# Patient Record
Sex: Female | Born: 1975 | Race: White | Hispanic: No | Marital: Married | Smoking: Never smoker
Health system: Southern US, Community
[De-identification: ages and names within clinical notes are randomized; demographics above are authoritative.]

## PROBLEM LIST (undated history)

## (undated) ENCOUNTER — Inpatient Hospital Stay (HOSPITAL_COMMUNITY): Admission: AD | Payer: Managed Care, Other (non HMO) | Source: Ambulatory Visit | Admitting: Obstetrics and Gynecology

## (undated) DIAGNOSIS — T7840XA Allergy, unspecified, initial encounter: Secondary | ICD-10-CM

## (undated) DIAGNOSIS — E785 Hyperlipidemia, unspecified: Secondary | ICD-10-CM

## (undated) DIAGNOSIS — C50919 Malignant neoplasm of unspecified site of unspecified female breast: Secondary | ICD-10-CM

## (undated) DIAGNOSIS — G43909 Migraine, unspecified, not intractable, without status migrainosus: Secondary | ICD-10-CM

## (undated) DIAGNOSIS — L0390 Cellulitis, unspecified: Secondary | ICD-10-CM

## (undated) DIAGNOSIS — I890 Lymphedema, not elsewhere classified: Secondary | ICD-10-CM

## (undated) DIAGNOSIS — E739 Lactose intolerance, unspecified: Secondary | ICD-10-CM

## (undated) DIAGNOSIS — M549 Dorsalgia, unspecified: Secondary | ICD-10-CM

## (undated) DIAGNOSIS — D509 Iron deficiency anemia, unspecified: Secondary | ICD-10-CM

## (undated) DIAGNOSIS — F419 Anxiety disorder, unspecified: Secondary | ICD-10-CM

## (undated) HISTORY — PX: KNEE SURGERY: SHX244

## (undated) HISTORY — PX: APPENDECTOMY: SHX54

## (undated) HISTORY — DX: Anxiety disorder, unspecified: F41.9

## (undated) HISTORY — PX: SHOULDER SURGERY: SHX246

## (undated) HISTORY — PX: ABDOMINAL HYSTERECTOMY: SHX81

## (undated) HISTORY — DX: Dorsalgia, unspecified: M54.9

## (undated) HISTORY — DX: Malignant neoplasm of unspecified site of unspecified female breast: C50.919

## (undated) HISTORY — DX: Migraine, unspecified, not intractable, without status migrainosus: G43.909

## (undated) HISTORY — DX: Lymphedema, not elsewhere classified: I89.0

## (undated) HISTORY — DX: Lactose intolerance, unspecified: E73.9

## (undated) HISTORY — DX: Allergy, unspecified, initial encounter: T78.40XA

## (undated) HISTORY — DX: Cellulitis, unspecified: L03.90

---

## 2003-04-03 ENCOUNTER — Encounter: Admission: RE | Admit: 2003-04-03 | Discharge: 2003-04-03 | Payer: Self-pay | Admitting: Gastroenterology

## 2003-04-03 ENCOUNTER — Encounter: Payer: Self-pay | Admitting: Gastroenterology

## 2003-08-06 ENCOUNTER — Other Ambulatory Visit: Admission: RE | Admit: 2003-08-06 | Discharge: 2003-08-06 | Payer: Self-pay | Admitting: Obstetrics and Gynecology

## 2006-05-07 ENCOUNTER — Encounter (INDEPENDENT_AMBULATORY_CARE_PROVIDER_SITE_OTHER): Payer: Self-pay | Admitting: *Deleted

## 2006-05-07 ENCOUNTER — Inpatient Hospital Stay (HOSPITAL_COMMUNITY): Admission: AD | Admit: 2006-05-07 | Discharge: 2006-05-09 | Payer: Self-pay | Admitting: Obstetrics and Gynecology

## 2006-06-27 ENCOUNTER — Ambulatory Visit: Payer: Self-pay | Admitting: Family Medicine

## 2006-12-15 DIAGNOSIS — J301 Allergic rhinitis due to pollen: Secondary | ICD-10-CM

## 2007-05-23 ENCOUNTER — Telehealth (INDEPENDENT_AMBULATORY_CARE_PROVIDER_SITE_OTHER): Payer: Self-pay | Admitting: *Deleted

## 2007-05-24 ENCOUNTER — Ambulatory Visit: Payer: Self-pay | Admitting: Family Medicine

## 2007-06-01 ENCOUNTER — Telehealth (INDEPENDENT_AMBULATORY_CARE_PROVIDER_SITE_OTHER): Payer: Self-pay | Admitting: *Deleted

## 2007-06-01 LAB — CONVERTED CEMR LAB: Lead-Whole Blood: 0.9 ug/dL (ref <10.0)

## 2008-01-25 ENCOUNTER — Encounter: Payer: Self-pay | Admitting: Family Medicine

## 2008-01-25 ENCOUNTER — Telehealth (INDEPENDENT_AMBULATORY_CARE_PROVIDER_SITE_OTHER): Payer: Self-pay | Admitting: *Deleted

## 2008-01-26 ENCOUNTER — Ambulatory Visit (HOSPITAL_COMMUNITY): Admission: RE | Admit: 2008-01-26 | Discharge: 2008-01-26 | Payer: Self-pay | Admitting: Gastroenterology

## 2008-06-15 ENCOUNTER — Encounter: Payer: Self-pay | Admitting: Family Medicine

## 2010-07-23 ENCOUNTER — Inpatient Hospital Stay (HOSPITAL_COMMUNITY)
Admission: AD | Admit: 2010-07-23 | Discharge: 2010-07-23 | Payer: Self-pay | Source: Home / Self Care | Admitting: Obstetrics & Gynecology

## 2010-08-23 NOTE — L&D Delivery Note (Signed)
Complete dilation at 0800 vtx +3. Urge to push - water birth. FHT 150.  SVB viable female - no difficulty in shoulders at 0814. No nuchal cord. Newborn brought to surface immediately at birth -to mother. Cord double clamped and cut by FOB after pulsation cessation.  Mother assisted out of tub.  Cord blood sample and cord blood donation obtained.  Placenta spontaneous and intact with 3VC at 0830. Fundus firm after massage and small bleeding. Pitocin 10 units IM.  1 degree laceration - 1% xylocaine local. Repair subcuticular with 4-0 vicryl.  EBL: 500 ml

## 2010-08-27 ENCOUNTER — Inpatient Hospital Stay (HOSPITAL_COMMUNITY)
Admission: AD | Admit: 2010-08-27 | Discharge: 2010-08-30 | Payer: Self-pay | Source: Home / Self Care | Attending: Obstetrics and Gynecology | Admitting: Obstetrics and Gynecology

## 2010-08-27 LAB — COMPREHENSIVE METABOLIC PANEL
ALT: 15 U/L (ref 0–35)
AST: 20 U/L (ref 0–37)
Albumin: 3.4 g/dL — ABNORMAL LOW (ref 3.5–5.2)
Alkaline Phosphatase: 66 U/L (ref 39–117)
BUN: 6 mg/dL (ref 6–23)
CO2: 22 mEq/L (ref 19–32)
Calcium: 9 mg/dL (ref 8.4–10.5)
Chloride: 103 mEq/L (ref 96–112)
Creatinine, Ser: 0.73 mg/dL (ref 0.4–1.2)
GFR calc Af Amer: >60 mL/min (ref >60)
GFR calc non Af Amer: >60 mL/min (ref >60)
Glucose, Bld: 82 mg/dL (ref 70–99)
Potassium: 3.8 mEq/L (ref 3.5–5.1)
Sodium: 134 mEq/L — ABNORMAL LOW (ref 135–145)
Total Bilirubin: 0.3 mg/dL (ref 0.3–1.2)
Total Protein: 6.7 g/dL (ref 6.0–8.3)

## 2010-08-27 LAB — URINE MICROSCOPIC-ADD ON

## 2010-08-27 LAB — CBC
HCT: 36.7 % (ref 36.0–46.0)
Hemoglobin: 12.6 g/dL (ref 12.0–15.0)
MCH: 30 pg (ref 26.0–34.0)
MCHC: 34.3 g/dL (ref 30.0–36.0)
MCV: 87.4 fL (ref 78.0–100.0)
Platelets: 255 10*3/uL (ref 150–400)
RBC: 4.2 MIL/uL (ref 3.87–5.11)
RDW: 12 % (ref 11.5–15.5)
WBC: 8.7 10*3/uL (ref 4.0–10.5)

## 2010-08-27 LAB — URINALYSIS, ROUTINE W REFLEX MICROSCOPIC
Hemoglobin, Urine: NEGATIVE
Ketones, ur: NEGATIVE mg/dL
Nitrite: NEGATIVE
Protein, ur: NEGATIVE mg/dL
Specific Gravity, Urine: 1.025 (ref 1.005–1.030)
Urine Glucose, Fasting: NEGATIVE mg/dL
Urobilinogen, UA: 0.2 mg/dL (ref 0.0–1.0)
pH: 7 (ref 5.0–8.0)

## 2010-08-27 LAB — GLUCOSE, CAPILLARY
Glucose-Capillary: 111 mg/dL — ABNORMAL HIGH (ref 70–99)
Glucose-Capillary: 111 mg/dL — ABNORMAL HIGH (ref 70–99)

## 2010-08-28 LAB — GLUCOSE, CAPILLARY
Glucose-Capillary: 95 mg/dL (ref 70–99)
Glucose-Capillary: 77 mg/dL (ref 70–99)

## 2010-09-07 LAB — GLUCOSE, CAPILLARY
Glucose-Capillary: 94 mg/dL (ref 70–99)
Glucose-Capillary: 89 mg/dL (ref 70–99)
Glucose-Capillary: 111 mg/dL — ABNORMAL HIGH (ref 70–99)
Glucose-Capillary: 121 mg/dL — ABNORMAL HIGH (ref 70–99)
Glucose-Capillary: 88 mg/dL (ref 70–99)

## 2010-10-16 NOTE — Discharge Summary (Signed)
Jody Dunn, Jody Dunn               ACCOUNT NO.:  [id number]  MEDICAL RECORD NO.:  [id number]          PATIENT TYPE:  INP  LOCATION:  9315                          FACILITY:  [facility]  PHYSICIAN:  Lenoard Aden, M.D.DATE OF BIRTH:  1976/06/27  DATE OF ADMISSION:  08/27/2010 DATE OF DISCHARGE:  08/30/2010                              DISCHARGE SUMMARY   ADMITTING DIAGNOSES: 1. 13 weeks' gestation. 2. Hyperemesis. 3. Dehydration. 4. Positive Helicobacter pylori. 5. Gastroesophageal reflux disease.  DISCHARGE DIAGNOSES: 1. 13 weeks' gestation. 2. Hyperemesis, stable condition. 3. Positive Helicobacter pylori, under treatment.  HISTORY OF PRESENT ILLNESS:  The patient is a 35 year old, gravida 2, para 1 at 47 weeks' gestation with an [facility] of February 24, 2011.  The patient has received prenatal care at [facility] with [name], certified nurse midwife as primary since 6 weeks' gestation.  Pregnancy is complicated by hyperemesis, dehydration, and positive H. Pylori.  MEDICATIONS AT TIME OF ADMISSION: 1. Probiotic. 2. Phenergan 25 mg p.o. daily. 3. Zantac 150 mg p.o. daily. 4. Clarithromycin b.i.d. 5. Metronidazole 500 b.i.d.  Both antibiotics intolerable by p.o.     intake. 6. Zofran 8 mg p.o. p.r.n. for vomiting. 7. Reglan 10 mg p.o. q.6 h.  The patient has a known allergy to PENICILLIN, DURAPREP, and IODINE.  MEDICAL HISTORY:  Complicated by an allergic rhinitis, GERD, lactose intolerance, mild reactive airway with asthma exercise induced, altered fasting glucose, and PCOS with infertility.  SURGERY HISTORY:  Appendectomy, shoulder surgery x1, knee surgery with a torn ACL x2.  OBSTETRIC HISTORY:  History of a 41-week delivery, 9 pounds and 7 ounces with a mild shoulder dystocia.  PRESENTATION:  The patient presents to the hospital for IV hydration and IV antibiotics due to failed home health, IV infiltration x5, and unable to tolerate oral antibiotics for  treatment for H. pylori.  The patient is admitted with IV hydration.  On admission, CBC, white blood cell count is 8.7, hemoglobin is 12.6, hematocrit is 36.7, and platelet count 255.  Potassium of 3.8 and creatinine of 0.073.  Urine specific gravity is 1.025 and no ketones on the day of admission.  The patient is initiated on IV Flagyl and oral erythromycin in addition to Nexium.  On hospital day #1, the patient is tolerating medication with only occasional nausea and vomiting.  The patient is still positive 2300 on I and O.  Physical exam is within normal limits.  Fetal heart tones 140s. On hospital day #2, fasting blood sugar was 111.  The patient is negative 700 on net  I and O.  The patient is tolerating more oral intake, tolerating antibiotics orally, only mild nausea and 1 episode of vomiting.  On hospital day #3, the patient was tolerating medications, no nausea, no vomiting, negative net output with rehydration.  The patient is discharged home in stable condition.  MEDICATIONS AT TIME OF DISCHARGE: 1. Prenatal vitamin 1 tablet p.o. daily. 2. Probiotics p.o. daily. 3. Nexium 40 mg p.o. daily. 4. Clarithromycin 1 tablet p.o. daily x2 weeks. 5. Meclizine 25 mg p.o. b.i.d. 6. Zofran 4 mg p.o. p.r.n. only for vomiting.  The patient is to follow up at [facility] in 4 days for routine OB appointment.     [name], C.N.M.   ______________________________ Lenoard Aden, M.D.    TB/MEDQ  D:  09/26/2010  T:  09/27/2010  Job:  [id number]  Electronically Signed by [name] C.N.M. on 10/08/2010 11:18:54 AM Electronically Signed by Olivia Mackie M.D. on 10/16/2010 11:06:11 AM

## 2010-11-02 LAB — URINALYSIS, ROUTINE W REFLEX MICROSCOPIC
Bilirubin Urine: NEGATIVE
Glucose, UA: NEGATIVE mg/dL
Hgb urine dipstick: NEGATIVE
Ketones, ur: 15 mg/dL — AB
Nitrite: NEGATIVE
Protein, ur: NEGATIVE mg/dL
Specific Gravity, Urine: >1.03 — ABNORMAL HIGH (ref 1.005–1.030)
Urobilinogen, UA: 1 mg/dL (ref 0.0–1.0)
pH: 6 (ref 5.0–8.0)

## 2011-03-01 ENCOUNTER — Inpatient Hospital Stay (HOSPITAL_COMMUNITY)
Admission: AD | Admit: 2011-03-01 | Discharge: 2011-03-02 | DRG: 775 | Disposition: A | Discharge status: Home / Self Care | Payer: Managed Care, Other (non HMO) | Source: Ambulatory Visit | Attending: Obstetrics and Gynecology | Admitting: Obstetrics and Gynecology

## 2011-03-01 ENCOUNTER — Encounter (HOSPITAL_COMMUNITY): Payer: Self-pay | Admitting: *Deleted

## 2011-03-01 DIAGNOSIS — D509 Iron deficiency anemia, unspecified: Secondary | ICD-10-CM | POA: Insufficient documentation

## 2011-03-01 DIAGNOSIS — O9902 Anemia complicating childbirth: Secondary | ICD-10-CM | POA: Diagnosis present

## 2011-03-01 HISTORY — DX: Iron deficiency anemia, unspecified: D50.9

## 2011-03-01 LAB — CBC
HCT: 31.3 % — ABNORMAL LOW (ref 36.0–46.0)
HCT: 30.4 % — ABNORMAL LOW (ref 36.0–46.0)
Hemoglobin: 10.1 g/dL — ABNORMAL LOW (ref 12.0–15.0)
Hemoglobin: 9.8 g/dL — ABNORMAL LOW (ref 12.0–15.0)
MCH: 27.7 pg (ref 26.0–34.0)
MCH: 27.5 pg (ref 26.0–34.0)
MCHC: 32.3 g/dL (ref 30.0–36.0)
MCHC: 32.2 g/dL (ref 30.0–36.0)
MCV: 85.8 fL (ref 78.0–100.0)
MCV: 85.2 fL (ref 78.0–100.0)
Platelets: 278 10*3/uL (ref 150–400)
Platelets: 235 10*3/uL (ref 150–400)
RBC: 3.65 MIL/uL — ABNORMAL LOW (ref 3.87–5.11)
RBC: 3.57 MIL/uL — ABNORMAL LOW (ref 3.87–5.11)
RDW: 13.9 % (ref 11.5–15.5)
RDW: 13.9 % (ref 11.5–15.5)
WBC: 16.6 10*3/uL — ABNORMAL HIGH (ref 4.0–10.5)
WBC: 16.4 10*3/uL — ABNORMAL HIGH (ref 4.0–10.5)

## 2011-03-01 LAB — RAPID HIV SCREEN (WH-MAU): Rapid HIV Screen: NONREACTIVE

## 2011-03-01 LAB — RPR: RPR Ser Ql: NONREACTIVE

## 2011-03-01 MED ORDER — RHO D IMMUNE GLOBULIN 1500 UNIT/2ML IJ SOLN: 300.0000 ug | Freq: Once | INTRAMUSCULAR | Status: DC

## 2011-03-01 MED ORDER — OXYTOCIN 10 UNIT/ML IJ SOLN
INTRAMUSCULAR | Status: AC
  Administered 2011-03-01: 10 [IU] via INTRAMUSCULAR
  Filled 2011-03-01: qty 2

## 2011-03-01 MED ORDER — TETANUS-DIPHTH-ACELL PERTUSSIS 5-2.5-18.5 LF-MCG/0.5 IM SUSP
0.5000 mL | Freq: Once | INTRAMUSCULAR | Status: AC
  Administered 2011-03-02: 0.5 mL via INTRAMUSCULAR
  Filled 2011-03-01: qty 0.5

## 2011-03-01 MED ORDER — FLEET ENEMA 7-19 GM/118ML RE ENEM: 1.0000 | ENEMA | RECTAL | Status: DC | PRN

## 2011-03-01 MED ORDER — LACTATED RINGERS IV SOLN: 500.0000 mL | Freq: Once | INTRAVENOUS | Status: DC | PRN

## 2011-03-01 MED ORDER — IBUPROFEN 600 MG PO TABS: 600.0000 mg | ORAL_TABLET | Freq: Four times a day (QID) | ORAL | Status: DC | PRN

## 2011-03-01 MED ORDER — BENZOCAINE-MENTHOL 20-0.5 % EX AERO
1.0000 "application " | INHALATION_SPRAY | CUTANEOUS | Status: DC | PRN
  Administered 2011-03-01: 17:00:00 via TOPICAL

## 2011-03-01 MED ORDER — LIDOCAINE HCL (PF) 1 % IJ SOLN
30.0000 mL | Freq: Once | INTRAMUSCULAR | Status: AC | PRN
  Administered 2011-03-01: 30 mL via SUBCUTANEOUS
  Filled 2011-03-01: qty 30

## 2011-03-01 MED ORDER — LACTATED RINGERS IV SOLN: INTRAVENOUS | Status: DC

## 2011-03-01 MED ORDER — ONDANSETRON HCL 4 MG/2ML IJ SOLN: 4.0000 mg | Freq: Four times a day (QID) | INTRAMUSCULAR | Status: DC | PRN

## 2011-03-01 MED ORDER — SENNOSIDES-DOCUSATE SODIUM 8.6-50 MG PO TABS
1.0000 | ORAL_TABLET | Freq: Every day | ORAL | Status: DC
  Administered 2011-03-01: 1 via ORAL
  Filled 2011-03-01 (×2): qty 2

## 2011-03-01 MED ORDER — IBUPROFEN 600 MG PO TABS
600.0000 mg | ORAL_TABLET | Freq: Four times a day (QID) | ORAL | Status: DC
  Administered 2011-03-01 – 2011-03-02 (×2): 600 mg via ORAL
  Filled 2011-03-01 (×2): qty 1

## 2011-03-01 MED ORDER — OXYCODONE-ACETAMINOPHEN 5-325 MG PO TABS: 1.0000 | ORAL_TABLET | ORAL | Status: DC | PRN

## 2011-03-01 MED ORDER — ONDANSETRON HCL 4 MG PO TABS: 4.0000 mg | ORAL_TABLET | ORAL | Status: DC | PRN

## 2011-03-01 MED ORDER — DIPHENHYDRAMINE HCL 25 MG PO CAPS: 25.0000 mg | ORAL_CAPSULE | Freq: Four times a day (QID) | ORAL | Status: DC | PRN

## 2011-03-01 MED ORDER — PANTOPRAZOLE SODIUM 40 MG PO TBEC
40.0000 mg | DELAYED_RELEASE_TABLET | Freq: Every day | ORAL | Status: DC
  Administered 2011-03-01 – 2011-03-02 (×2): 40 mg via ORAL
  Filled 2011-03-01 (×3): qty 1

## 2011-03-01 MED ORDER — CITRIC ACID-SODIUM CITRATE 334-500 MG/5ML PO SOLN: 30.0000 mL | ORAL | Status: DC | PRN

## 2011-03-01 MED ORDER — LANOLIN HYDROUS EX OINT: TOPICAL_OINTMENT | CUTANEOUS | Status: DC | PRN

## 2011-03-01 MED ORDER — OXYTOCIN 20 UNITS IN LACTATED RINGERS INFUSION - SIMPLE: 125.0000 mL/h | Freq: Once | INTRAVENOUS | Status: DC

## 2011-03-01 MED ORDER — SIMETHICONE 80 MG PO CHEW: 80.0000 mg | CHEWABLE_TABLET | ORAL | Status: DC | PRN

## 2011-03-01 MED ORDER — WITCH HAZEL-GLYCERIN EX PADS: MEDICATED_PAD | CUTANEOUS | Status: DC | PRN

## 2011-03-01 MED ORDER — ACETAMINOPHEN 325 MG PO TABS: 650.0000 mg | ORAL_TABLET | ORAL | Status: DC | PRN

## 2011-03-01 MED ORDER — PRENATAL PLUS 27-1 MG PO TABS
1.0000 | ORAL_TABLET | Freq: Every day | ORAL | Status: DC
  Filled 2011-03-01: qty 1

## 2011-03-01 MED ORDER — BENZOCAINE-MENTHOL 20-0.5 % EX AERO
INHALATION_SPRAY | CUTANEOUS | Status: AC
  Filled 2011-03-01: qty 56

## 2011-03-01 NOTE — H&P (Signed)
Jody Dunn is a 35 y.o. female presenting for onset of labor. Maternal Medical History:  Reason for admission: Reason for admission: contractions.  Contractions: Onset was 3-5 hours ago.   Frequency: regular.   Duration is approximately 3 minutes.   Perceived severity is moderate.    Fetal activity: Perceived fetal activity is normal.   Last perceived fetal movement was within the past hour.    Prenatal complications: no prenatal complications Prenatal Complications - Diabetes: none.    OB History    Grav Para Term Preterm Abortions TAB SAB Ect Mult Living   2 1 1  0 0 0 0 0 0 1     Past Medical History  Diagnosis Date  . No pertinent past medical history    Past Surgical History  Procedure Date  . Appendectomy   . Shoulder surgery   . Knee surgery     X2   Family History: family history is not on file. Social History:  reports that she has never smoked. She does not have any smokeless tobacco history on file. She reports that she does not drink alcohol or use illicit drugs.  ROS Contractions since 0300 - no LOF or bleeding. Desires water birth. Hx moderate shoulder dystocia without maternal or fetal trauma with last delivery -birth weight 9-7 female at 41+ weeks. Current pregnancy EFW 8-13 at 54 weeks - female. Counseled regarding repeat risk of shoulder dystocia - pt declined elective cs or IOL / desires SVD - will proceed with cautious expectant management / MD updated (Jody Dunn) at time of admit.  Dilation: 3 Effacement (%): 80 Station: 0 Exam by:: Jody Cedar, RN Blood pressure 133/80, pulse 88, temperature 97.2 F (36.2 C), temperature source Axillary, resp. rate 19, height 5\' 8"  (1.727 m), weight 86.183 kg (190 lb). Exam Physical Exam  Alert and oriented x 3 - moderate discomfort with pain.  Heart RRR Lungs - clear Abdomen - gravid / nontender External genitalia - normal / no lesions  Extremities - trace edema / no varicosities  Prenatal labs: ABO, Rh:  O  +  Antibody:  Negative Rubella:  Rubella RPR:   NR HBsAg:   Negative HIV:   NR GBS:   negative  Assessment/Plan: 40 weeks in active labor Iron Deficiency anemia H-Pylori / GERD Hx shoulder dystocia  1) Admit - cautious expectant management  2) AROM with advanced dilation 3) attempt cautious SVD Jody Dunn 03/01/2011, 6:48 AM

## 2011-03-01 NOTE — Progress Notes (Signed)
Pt to room 161 ambulatory.

## 2011-03-01 NOTE — Progress Notes (Signed)
Increase with contractions - urge to push Laboring in tub No nausea - + shakes  VS stable - temp 97.7  Tub temp 98 degrees FHR doppler 150 Ctx every 3-4 minutes Cervix: 9 cm / 95% / vtx / BBOW AROM - clear - vtx + 2 ROA  A/P: Active labor           Prepare for SVD

## 2011-03-01 NOTE — Progress Notes (Signed)
BREASTFEEDING CONSULTATION SERVICES INFORMATION GIVEN TO PATIENT.  PATIENT BREASTFED FIRST BABY FOR SEVERAL MONTHS WITHOUT PROBLEMS.  NEWBORN IS NURSING VERY WELL PER MOM.  ENCOURAGED HER TO CALL FOR CONCERNS OR ASSIST PRN.

## 2011-03-01 NOTE — ED Notes (Signed)
Report called to birthing suites rn charge.  Pt may go to room 161.

## 2011-03-02 LAB — CBC
HCT: 29.6 % — ABNORMAL LOW (ref 36.0–46.0)
Hemoglobin: 9.3 g/dL — ABNORMAL LOW (ref 12.0–15.0)
MCH: 27 pg (ref 26.0–34.0)
MCHC: 31.4 g/dL (ref 30.0–36.0)
MCV: 86 fL (ref 78.0–100.0)
Platelets: 234 10*3/uL (ref 150–400)
RBC: 3.44 MIL/uL — ABNORMAL LOW (ref 3.87–5.11)
RDW: 14.2 % (ref 11.5–15.5)
WBC: 13.7 10*3/uL — ABNORMAL HIGH (ref 4.0–10.5)

## 2011-03-02 NOTE — Progress Notes (Signed)
Per mom infant recently ate , 15 minutes . Reviewed engorgement tx.

## 2011-03-02 NOTE — Discharge Summary (Signed)
  Patient ID: SHRUTI ARREY MRN: [id number] DOB/AGE: 03-11-1976 35 y.o.  Admit date: 03/01/2011 Discharge date: 03/02/2011  Admission Diagnoses: 40 weeks active labor  Discharge Diagnoses:  Active Problems:  Postpartum care and examination of lactating mother   Discharged Condition: stable  Hospital Course: admit in active labor - water birth plan / FHR cat 1 EFM then doppler in active labor - second stage                                AROM - clear - precipitous labor and birth - resulting SVD water birth  Consults: none  Significant Diagnostic Studies: none  Treatments: none  Discharge Exam: Blood pressure 138/87, pulse 73, temperature 97.6 F (36.4 C), temperature source Oral, resp. rate 20, height 5\' 8"  (1.727 m), weight 86.183 kg (190 lb). see progress note  Disposition:   Discharge Orders    Future Orders Please Complete By Expires   Diet - low sodium heart healthy      Discharge instructions      Comments:   [facility] discharge booklet     Current Discharge Medication List    CONTINUE these medications which have NOT CHANGED   Details  pantoprazole (PROTONIX) 40 MG tablet Take 40 mg by mouth daily.      prenatal vitamin w/FE, FA (PRENATAL 1 + 1) 27-1 MG TABS Take 1 tablet by mouth daily.      Probiotic Product (PROBIOTIC & ACIDOPHILUS EX ST) CAPS Take 1 capsule by mouth daily.         Follow-up Information    Follow up with BAILEY,TANYA. Make an appointment in 6 weeks.   Contact information:   [address] [place] [address] [phone]          Signed: [name] 03/02/2011, 10:28 AM

## 2011-03-02 NOTE — Progress Notes (Signed)
  PPD 1 SVD - water birth female / breastfeeding  S:  Reports feeling great             Tolerating po/ No nausea or vomiting             Bleeding is light             Pain controlled withibuprofen (OTC)             Up ad lib / ambulatory  Newborn breast feeding     O:  A & O x 3 NAD             VS: Blood pressure 138/87, pulse 73, temperature 97.6 F (36.4 C), temperature source Oral, resp. rate 20, height 5\' 8"  (1.727 m), weight 86.183 kg (190 lb).  LABS:  Basename 03/02/11 0520 03/01/11 1310  HGB 9.3* 9.8*  HCT 29.6* 30.4*    I&O:        Lungs: Clear and unlabored  Heart: regular rate and rhythm / no mumurs  Abdomen: soft, non-tender, non-distended              Fundus: firm, non-tender, U-1  Perineum: no edema  Lochia: light  Extremities: no edema, no calf pain or tenderness, negative Homans    A/P: PPD # 1 SVD female / 1degree LAC / water birth  Doing well - stable status  Routine post partum orders  Early discharge home  Jody Dunn 03/02/2011, 10:24 AM

## 2011-06-08 ENCOUNTER — Encounter (HOSPITAL_COMMUNITY): Payer: Self-pay | Admitting: *Deleted

## 2012-01-22 DIAGNOSIS — C50919 Malignant neoplasm of unspecified site of unspecified female breast: Secondary | ICD-10-CM

## 2012-01-22 HISTORY — DX: Malignant neoplasm of unspecified site of unspecified female breast: C50.919

## 2012-02-04 ENCOUNTER — Other Ambulatory Visit: Payer: Self-pay | Admitting: Obstetrics and Gynecology

## 2012-02-04 DIAGNOSIS — N63 Unspecified lump in unspecified breast: Secondary | ICD-10-CM

## 2012-02-09 ENCOUNTER — Other Ambulatory Visit: Payer: Self-pay

## 2012-02-09 HISTORY — PX: BREAST BIOPSY: SHX20

## 2012-02-10 ENCOUNTER — Other Ambulatory Visit: Payer: Self-pay | Admitting: Radiology

## 2012-02-10 DIAGNOSIS — C50911 Malignant neoplasm of unspecified site of right female breast: Secondary | ICD-10-CM

## 2012-02-11 ENCOUNTER — Other Ambulatory Visit: Payer: Managed Care, Other (non HMO)

## 2012-02-15 ENCOUNTER — Ambulatory Visit
Admission: RE | Admit: 2012-02-15 | Discharge: 2012-02-15 | Disposition: A | Discharge status: Home / Self Care | Payer: Managed Care, Other (non HMO) | Source: Ambulatory Visit | Attending: Radiology | Admitting: Radiology

## 2012-02-15 ENCOUNTER — Other Ambulatory Visit: Payer: Self-pay | Admitting: *Deleted

## 2012-02-15 DIAGNOSIS — C50419 Malignant neoplasm of upper-outer quadrant of unspecified female breast: Secondary | ICD-10-CM | POA: Insufficient documentation

## 2012-02-15 DIAGNOSIS — C50911 Malignant neoplasm of unspecified site of right female breast: Secondary | ICD-10-CM

## 2012-02-15 MED ORDER — GADOBENATE DIMEGLUMINE 529 MG/ML IV SOLN
15.0000 mL | Freq: Once | INTRAVENOUS | Status: AC | PRN
  Administered 2012-02-15: 15 mL via INTRAVENOUS

## 2012-02-16 ENCOUNTER — Encounter: Payer: Self-pay | Admitting: *Deleted

## 2012-02-16 ENCOUNTER — Ambulatory Visit (HOSPITAL_BASED_OUTPATIENT_CLINIC_OR_DEPARTMENT_OTHER): Payer: Managed Care, Other (non HMO) | Admitting: Genetic Counselor

## 2012-02-16 ENCOUNTER — Ambulatory Visit: Payer: Managed Care, Other (non HMO) | Attending: General Surgery | Admitting: Physical Therapy

## 2012-02-16 ENCOUNTER — Ambulatory Visit (HOSPITAL_BASED_OUTPATIENT_CLINIC_OR_DEPARTMENT_OTHER): Payer: Managed Care, Other (non HMO) | Admitting: General Surgery

## 2012-02-16 ENCOUNTER — Encounter: Payer: Self-pay | Admitting: Genetic Counselor

## 2012-02-16 ENCOUNTER — Encounter: Payer: Self-pay | Admitting: Oncology

## 2012-02-16 ENCOUNTER — Other Ambulatory Visit (HOSPITAL_BASED_OUTPATIENT_CLINIC_OR_DEPARTMENT_OTHER): Payer: Managed Care, Other (non HMO) | Admitting: Lab

## 2012-02-16 ENCOUNTER — Ambulatory Visit
Admission: RE | Admit: 2012-02-16 | Discharge: 2012-02-16 | Disposition: A | Discharge status: Home / Self Care | Payer: Managed Care, Other (non HMO) | Source: Ambulatory Visit | Attending: Radiation Oncology | Admitting: Radiation Oncology

## 2012-02-16 ENCOUNTER — Ambulatory Visit: Payer: Managed Care, Other (non HMO)

## 2012-02-16 ENCOUNTER — Ambulatory Visit (HOSPITAL_BASED_OUTPATIENT_CLINIC_OR_DEPARTMENT_OTHER): Payer: Managed Care, Other (non HMO) | Admitting: Oncology

## 2012-02-16 VITALS — BP 108/72 | HR 84 | Temp 98.2°F | Ht 66.2 in | Wt 170.2 lb

## 2012-02-16 DIAGNOSIS — E739 Lactose intolerance, unspecified: Secondary | ICD-10-CM

## 2012-02-16 DIAGNOSIS — O210 Mild hyperemesis gravidarum: Secondary | ICD-10-CM | POA: Insufficient documentation

## 2012-02-16 DIAGNOSIS — C50419 Malignant neoplasm of upper-outer quadrant of unspecified female breast: Secondary | ICD-10-CM

## 2012-02-16 DIAGNOSIS — C50919 Malignant neoplasm of unspecified site of unspecified female breast: Secondary | ICD-10-CM

## 2012-02-16 DIAGNOSIS — G43909 Migraine, unspecified, not intractable, without status migrainosus: Secondary | ICD-10-CM | POA: Insufficient documentation

## 2012-02-16 DIAGNOSIS — IMO0002 Reserved for concepts with insufficient information to code with codable children: Secondary | ICD-10-CM

## 2012-02-16 DIAGNOSIS — M25619 Stiffness of unspecified shoulder, not elsewhere classified: Secondary | ICD-10-CM | POA: Insufficient documentation

## 2012-02-16 DIAGNOSIS — R233 Spontaneous ecchymoses: Secondary | ICD-10-CM

## 2012-02-16 DIAGNOSIS — M545 Low back pain: Secondary | ICD-10-CM

## 2012-02-16 DIAGNOSIS — Z170 Estrogen receptor positive status [ER+]: Secondary | ICD-10-CM

## 2012-02-16 DIAGNOSIS — IMO0001 Reserved for inherently not codable concepts without codable children: Secondary | ICD-10-CM | POA: Insufficient documentation

## 2012-02-16 LAB — CBC WITH DIFFERENTIAL/PLATELET
Basophils Absolute: 0.1 10*3/uL (ref 0.0–0.1)
HCT: 39.6 % (ref 34.8–46.6)
HGB: 13.3 g/dL (ref 11.6–15.9)
LYMPH%: 32.6 % (ref 14.0–49.7)
MONO#: 0.5 10*3/uL (ref 0.1–0.9)
NEUT%: 57.7 % (ref 38.4–76.8)
Platelets: 307 10*3/uL (ref 145–400)
WBC: 7.4 10*3/uL (ref 3.9–10.3)
lymph#: 2.4 10*3/uL (ref 0.9–3.3)

## 2012-02-16 LAB — COMPREHENSIVE METABOLIC PANEL
BUN: 13 mg/dL (ref 6–23)
CO2: 24 mEq/L (ref 19–32)
Calcium: 9 mg/dL (ref 8.4–10.5)
Chloride: 102 mEq/L (ref 96–112)
Creatinine, Ser: 0.75 mg/dL (ref 0.50–1.10)
Glucose, Bld: 109 mg/dL — ABNORMAL HIGH (ref 70–99)
Total Bilirubin: 0.2 mg/dL — ABNORMAL LOW (ref 0.3–1.2)

## 2012-02-16 LAB — CANCER ANTIGEN 27.29: CA 27.29: 28 U/mL (ref 0–39)

## 2012-02-16 NOTE — Assessment & Plan Note (Addendum)
Pt with multifocal right breast cancer.  Both cancers are in the right upper outer quadrant, but they are very far apart, and this would yield poor cosmetic results.  Pt is also 36 years old, and has significant family history for cancer.  She would definitely like to pursue mastectomy on the right side.  She states that she would probably pursue left mastectomy as well.    She has some peau d'orange and some erythema on the medial right breast.   We will recommend neoadjuvant chemotherapy in order to get chemo started right away.   This way, we will get better condition of the skin, decrease in size of lymph nodes, and will give [place] time to get plastics and genetics testing.    She has palpable lymph nodes on the right.  These will likely lead to an ALND post neoadjuvant chemotherapy. She will also likely need radiation because of her nodes and her age.   We will refer her to plastics in around a month.  I will schedule her for a port a cath and skin punch biopsy.   I would like to see her back in around 3 months to plan surgery.

## 2012-02-16 NOTE — Progress Notes (Signed)
ID: [name]   DOB: 04-09-1976  MR#: [id number]  WUJ#:[id number]  HISTORY OF PRESENT ILLNESS: Fronia had a mammogram at [facility] of 2010 because of shooting pains in the right breast. This found no worrisome finding. More recently, in the spring of 2012, the patient at delivered her second child. She nursed but the child refused to take milk from the right breast. The patient tells me that she has been able to palpate easily from the right breast, so production is not an issue. More recently, early June 2013, the patient felt a new lump in the right breast, and brought this to the attention of [name] at [facility]. The patient had repeat mammography at [facility] 02/07/2012. This found at the breast tissue to be heterogeneously dense, which is not a surprise given that the patient is lactating. There was diffuse skin thickening. Erythema is not described. The nipple was retracted. There was no definitive peau d'orange appearance. Pleomorphic calcifications in the lateral right breast extended approximately 11 cm, without definite mass. On ultrasound there was a vague irregular hypoechoic mass measuring approximately 15 mm in the right breast, with a second ill-defined hypoechoic lobulated mass measuring approximately 17 mm. Both these masses were biopsied 02/09/2012, and the final pathology ([id number]) showed both IIb invasive ductal carcinoma, with a prognostic panel (from the mass at 11:00) as follows: estrogen receptor positive at 90%, progesterone receptor positive at 90%, Mib-1 of 30%, and HER-2 amplification with a ratio of 4.29 by CISH. MRI of the breast was performed 02/15/2012 and showed the mass in the 10:00 position to measure 3.1 cm, the one at the 11:00 position to measure 3.1 cm. There was diffuse skin thickening in the right breast and 2 enhancing abnormal-appearing lower right axillary lymph nodes measuring 2.0 and 1.8 cm respectively. There was no evidence for internal mammary  adenopathy, left axillary adenopathy, or significant findings in the left breast.  INTERVAL HISTORY: The patient was seen in the multidisciplinary breast cancer clinic 02/16/2012, accompanied by her husband Ed and their daughter Lurena Joiner.  REVIEW OF SYSTEMS: Aside from the lump in the right breast, Asher Muir noted no other unusual symptoms. She has some insomnia, chronic low back pain, occasional migraines, and occasional forgetfulness. She bruises easily. Otherwise a detailed review of systems today was entirely noncontributory.  PAST MEDICAL HISTORY: Past Medical History  Diagnosis Date  . Iron (Fe) deficiency anemia   . Asthma     exercise induced  . GERD (gastroesophageal reflux disease)     on protonix  . Breast cancer   . Back pain   . Migraines   . Hyperemesis gravidarum   . Lactose intolerance     PAST SURGICAL HISTORY: Past Surgical History  Procedure Date  . Appendectomy   . Shoulder surgery   . Knee surgery     X2    FAMILY HISTORY Family History  Problem Relation Age of Onset  . Cancer Paternal Aunt 41    Breast cancer (half sibling, related through father)  . Spina bifida Paternal Uncle 0    died around 71-78 months of age  . Cancer Maternal Grandmother 35    colon cancer  . Dementia Maternal Grandfather   . Cancer Paternal Grandmother     breast cancer between 31-42  . Cancer Paternal Grandfather     brain cancer  . Cancer Other     Paternal grandmother's siblings had breast cancer and pancreatic cancer  . Cancer Other 50  maternal great grandmother with breast cancer   the patient's parents are alive, in their early 50s. The patient is an only child. There is a significant family history of cancer including breast, brain, and colon, detailed in our genetic counselors report. BRCA and P. 53 testing is in progress.  GYNECOLOGIC HISTORY: Menarche age 16; she is GX P2, first live birth age 4. Most recent period was within the last month. She has a paraguard  IUD in place.   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 works as a Furniture conservator/restorer for [employer]. Their children are [name] (2008) and [name] (2012).   ADVANCED DIRECTIVES: not in place  HEALTH MAINTENANCE: History  Substance Use Topics  . Smoking status: Never Smoker   . Smokeless tobacco: Not on file  . Alcohol Use: 2.5 - 3.5 oz/week    5-7 drink(s) per week     Colonoscopy: no  PAP: 2012  Bone density: no  Lipid panel:  Allergies  Allergen Reactions  . Antiseptic Products, Misc.   Marland Kitchen Penicillins     Current Outpatient Prescriptions  Medication Sig Dispense Refill  . pantoprazole (PROTONIX) 40 MG tablet Take 40 mg by mouth daily.        . prenatal vitamin w/FE, FA (PRENATAL 1 + 1) 27-1 MG TABS Take 1 tablet by mouth daily.        . Probiotic Product (PROBIOTIC & ACIDOPHILUS EX ST) CAPS Take 1 capsule by mouth daily.          OBJECTIVE: Young white woman who appears well Filed Vitals:   02/16/12 1253  BP: 108/72  Pulse: 84  Temp: 98.2 F (36.8 C)     Body mass index is 27.31 kg/(m^2).    ECOG FS: 0  Sclerae unicteric Oropharynx clear No cervical or supraclavicular adenopathy Lungs no rales or rhonchi Heart regular rate and rhythm Abd soft, nontender, positive bowel sounds, no organomegaly MSK no focal spinal tenderness, no peripheral edema Neuro: nonfocal Breasts: The right breast is status post recent biopsy. There is still some iodine in the upper inner quadrant and also some sun darkening of the skin in a "V" distribution over the anterior chest. In the inferior and lateral aspect of the Right breast however there is a pink blush as well as prominent pores consistent with edema, suggestive of but not definitive for inflammatory breast cancer.  LAB RESULTS: Lab Results  Component Value Date   WBC 7.4 02/16/2012   NEUTROABS 4.3 02/16/2012   HGB 13.3 02/16/2012   HCT 39.6  02/16/2012   MCV 92.4 02/16/2012   PLT 307 02/16/2012      Chemistry      Component Value Date/Time   NA 138 02/16/2012 1226   K 3.4* 02/16/2012 1226   CL 102 02/16/2012 1226   CO2 24 02/16/2012 1226   BUN 13 02/16/2012 1226   CREATININE 0.75 02/16/2012 1226      Component Value Date/Time   CALCIUM 9.0 02/16/2012 1226   ALKPHOS 98 02/16/2012 1226   AST 19 02/16/2012 1226   ALT 12 02/16/2012 1226   BILITOT 0.2* 02/16/2012 1226       No results found for this basename: LABCA2    No components found with this basename: LABCA125    No results found for this basename: INR:1;PROTIME:1 in the last 168 hours  Urinalysis    Component Value Date/Time   COLORURINE YELLOW 08/27/2010 1126  APPEARANCEUR CLEAR 08/27/2010 1126   LABSPEC 1.025 08/27/2010 1126   PHURINE 7.0 08/27/2010 1126   GLUCOSEU NEGATIVE 07/23/2010 1335   HGBUR NEGATIVE 07/23/2010 1335   BILIRUBINUR SMALL* 08/27/2010 1126   KETONESUR NEGATIVE 08/27/2010 1126   PROTEINUR NEGATIVE 08/27/2010 1126   UROBILINOGEN 0.2 08/27/2010 1126   NITRITE NEGATIVE 08/27/2010 1126   LEUKOCYTESUR TRACE* 08/27/2010 1126    STUDIES: Mr Breast Bilateral W Wo Contrast  02/15/2012  *RADIOLOGY REPORT*  Clinical Data: Recently diagnosed invasive ductal carcinoma within the right breast at the 10 o'clock and 11 o'clock positions. Preoperative evaluation.  BILATERAL BREAST MRI WITH AND WITHOUT CONTRAST  Technique: Multiplanar, multisequence MR images of both breasts were obtained prior to and following the intravenous administration of 15ml of Multihance.  Three dimensional images were evaluated at the independent DynaCad workstation.  Comparison:  02/10/2012, 02/07/2012, 01/15/2009 studies from [place] [facility].  Findings: There is marked diffuse background parenchymal enhancement present bilaterally and this limits sensitivity. Within the right breast located at the 10 o'clock position corresponding to the recently biopsied invasive ductal carcinoma (via ultrasound  guidance)  is an area of distortion with enhancement with central clip artifact. This measures 3.1 x 1.2 x 2.2 cm in size and is associated with mixed enhancement characteristics.  Located within the right breast at the 11 o'clock position and associated with central clip artifact (the second area of known invasive ductal carcinoma ) is a 3.1 x 1.2 x 2.2 cm size irregular enhancing mass with mixed enhancement characteristics. There is marked diffuse background enhancement throughout the remainder of the right breast and also throughout the left breast which lowers the sensitivity of MRI in this patient.  There is no focal worrisome area of enhancement seen within the left breast.  There is diffuse skin thickening associated with the right breast. In addition, there are two enhancing, abnormal appearing lower right axillary lymph nodes located lateral to the pectoralis muscle measuring 2.0 and 1.8 cm in size.  These are worrisome for metastatic lymph nodes.  There is no evidence for internal mammary adenopathy or left axillary adenopathy and there are no additional findings.  IMPRESSION:  1.  Irregular enhancing mass with distortion located within the right breast at the 10 o'clock position corresponding to the recently biopsied invasive ductal carcinoma measuring 3.1 x 1.2 x 2.2 cm in size. 2.  Irregular enhancing mass located within the right breast at the 11 o'clock position corresponding to the recently biopsied invasive ductal carcinoma measuring 2.9 x 3.5 x 2.3 cm in size. 3.  Two adjacent abnormal appearing right lower axillary lymph nodes located lateral to the  pectoralis muscles worrisome for metastatic lymph nodes.  4.  Marked diffuse bilateral background parenchymal enhancement which lowers MR sensitivity.  RECOMMENDATION: Treatment planning.  THREE-DIMENSIONAL MR IMAGE RENDERING ON INDEPENDENT WORKSTATION:  Three-dimensional MR images were rendered by post-processing of the original MR data on an  independent workstation.  The three- dimensional MR images were interpreted, and findings were reported in the accompanying complete MRI report for this study.  BI-RADS CATEGORY 6:  Known biopsy-proven malignancy - appropriate action should be taken.  Original Report Authenticated By: Rolla Plate, M.D.    ASSESSMENT: 36 y.o. [place] woman status post right breast biopsy 02/09/2012 for a multifocal clinical E. T2 N1 tumors, but with skin changes consistent with T4 disease, and so stage III. Prognostic profile from one of the 2 masses shows it to be triple positive with an MIB-1 of 30%  PLAN: We  spent the better part of her visit today going over her situation in detail. She understands she needs staging studies to rule out stage IV disease. If indeed there is skin involvement (we will do a skin biopsy) she will definitely need a mastectomy. If she proves to be BRCA positive, she may choose to have bilateral mastectomies, although since she will need radiation on the right, most likely she will have a right mastectomy followed by radiation and then have a left mastectomy at the time of definitive reconstruction.  We are going to do neoadjuvant chemotherapy chiefly to make the surgery easier. She will receive dose dense doxorubicin/ cyclophosphamide x4 followed by paclitaxel x4, with trastuzumab started at the time of paclitaxel. Prior to the start of treatment she will have an echocardiogram, a PET scan and a CT scan of the chest, and she will come to "chemotherapy school" to learn more about possible side effects, toxicities and complications of treatment, although that was also discussed today. She will see me again on July 9, and she will complete one year of nursing exactly on that date. The next day she will have her first chemotherapy. A port is scheduled for later this month.   Gabriellia Rempel C    02/16/2012

## 2012-02-16 NOTE — Progress Notes (Addendum)
Dr. Darnelle Catalan requested a consultation for genetic counseling and risk assessment for Jody Dunn, a 36 y.o. female, for discussion of her personal history of breast cancer. She presents to clinic today with her husband and daughter Lurena Joiner to discuss the possibility of a genetic predisposition to cancer, and to further clarify her risks, as well as her family members' risks for cancer.   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 Treatment decisions will be made today in the [facility].    Past Medical History  Diagnosis Date  . Iron (Fe) deficiency anemia   . Asthma     exercise induced  . GERD (gastroesophageal reflux disease)     on protonix  . Breast cancer     Past Surgical History  Procedure Date  . Appendectomy   . Shoulder surgery   . Knee surgery     X2    History  Substance Use Topics  . Smoking status: Never Smoker   . Smokeless tobacco: Not on file  . Alcohol Use: No    REPRODUCTIVE HISTORY AND PERSONAL RISK ASSESSMENT FACTORS: Head Circumference: 55.2 CM Menarche was at age 55.   Premenopausal Uterus Intact: Yes Ovaries Intact: Yes G2P2A0 , first live birth at age 78  She has not previously undergone treatment for infertility.   OCP use for 9 years.  Currently uses copper IUD   She has not used HRT in the past.    FAMILY HISTORY:  We obtained a detailed, 4-generation family history.  Significant diagnoses are listed below: Family History  Problem Relation Age of Onset  . Cancer Paternal Aunt 53    Breast cancer (half sibling, related through father)  . Spina bifida Paternal Uncle 0    died around 25-1 months of age  . Cancer Maternal Grandmother 52    colon cancer  . Dementia Maternal Grandfather   . Cancer Paternal Grandmother     breast cancer between 51-42  . Cancer Paternal Grandfather     brain cancer  . Cancer Other    Paternal grandmother's siblings had breast cancer and pancreatic cancer  . Cancer Other 74    maternal great grandmother with breast cancer  The patient was diagnosed with triple positive breast cancer at age 20.  She is an only child.  Her parents are alive and cancer free.  Her maternal grandmother was diagnosed with colon cancer at age 68.  She is currently 63.  Her grandmother's mother was diagnosed with breast cancer and died at age 14.  There is no other cancer history reported on this side of the family.  The patients' father is 21.  He had a full brother who died under the age of 1 from spina bifida, and has a paternal half sister who had breast cancer at age 1.  She is currently 94.  The patient's paternal grandfather died of brain cancer at an unknown age.  Her paternal grandmother was diagnosed with breast cancer between the ages of 57-42 and died at 70.  This grandmother had a sister who had breast cancer at an unknown age, and a brother with pancreatic cancer.  There is no other known cancer history on this side of the family.  Patient's maternal ancestors are of [place] and [place] descent, and paternal ancestors are of [place] and [place] European descent. There is reported Ashkenazi Jewish ancestry.  There is no  known consanguinity.  GENETIC COUNSELING RISK ASSESSMENT, DISCUSSION, AND SUGGESTED FOLLOW UP: We reviewed the natural history and genetic etiology of sporadic, familial and hereditary cancer syndromes.  About 5-10% of breast cancer is hereditary.  Of this, about 85% is the result of a BRCA1 or BRCA2 mutation.  We reviewed the red flags of hereditary cancer syndromes and the dominant inheritance patterns.   If the BRCA testing is negative, we discussed that we could be testing for the wrong gene.  We discussed gene panels, and that several cancer genes that are associated with different cancers can be tested at the same time.  Because of the different types of cancer that are in the  patient's family, we will consider one of the panel tests if she is negative for BRCA mutations.  The patient's personal and family history is suggestive of the following possible diagnosis: Hereditary cancer syndrome  We discussed that identification of a hereditary cancer syndrome may help her care providers tailor the patients medical management. If a mutation indicating a hereditary cancer syndrome is detected in this case, the [employer] recommendations would include increased cancer surveillance and possible prophylactic surgery. If a mutation is detected, the patient will be referred back to the referring provider and to any additional appropriate care providers to discuss the relevant options.   If a mutation is not found in the patient, this will decrease the likelihood of hereditary cancer syndrome as the explanation for her breast cancer. Cancer surveillance options would be discussed for the patient according to the appropriate standard National Comprehensive Cancer Network and American Cancer Society guidelines, with consideration of their personal and family history risk factors. In this case, the patient will be referred back to their care providers for discussions of management.   In order to estimate her chance of having a BRCA1 or BRCA2 mutation, we used statistical models (Penn II) and laboratory data that take into account her personal medical history, family history and ancestry.  Because each model is different, there can be a lot of variability in the risks they give.  Therefore, these numbers must be considered a rough range and not a precise risk of having a BRCA1 or BRCA2 mutation.  These models estimate that she has approximately a 26% chance of having a mutation. Based on this assessment of her family and personal history, genetic testing is recommended.  After considering the risks, benefits, and limitations, the patient provided informed consent for   the following  testing: BRACAnalysis through [facility] or BRCAPlus through [employer], depending on care plan decided today.   Per the patient's request, we will contact her by telephone to discuss these results. A follow up genetic counseling visit will be scheduled if indicated.  The patient was seen for a total of 60 minutes, greater than 50% of which was spent face-to-face counseling.  This plan is being carried out per Dr. Hubert Azure recommendations.  This note will also be sent to the referring provider via the electronic medical record. The patient will be supplied with a summary of this genetic counseling discussion as well as educational information on the discussed hereditary cancer syndromes following the conclusion of their visit.   Patient was discussed with Dr. Drue Second. _______________________________________________________________________ For Office Staff:  Number of people involved in session: 4 Was an Intern/ student involved with case: not applicable

## 2012-02-16 NOTE — Progress Notes (Signed)
Chief complaint:  Right breast cancer.  HISTORY: Pt is a 36 year old female who presents with a palpable mass in the right breast at 12 o'clock.  She noted this around 2 weeks ago, and she was set up for imaging.  There was a second mass detected on ultrasound, and MRI confirms this.   Both biopsies are positive for cancer.   She also has some skin dimpling, and some erythema of skin.  She has a 10 month old and has been breastfeeding.  She is a G2P2, and has IUD.  She is not pregnant and has not used oral contraceptives for birth control.  She has a strong family history of cancer and breast cancer.  She does not have breast pain.    Past Medical History  Diagnosis Date  . Iron (Fe) deficiency anemia   . Asthma     exercise induced  . GERD (gastroesophageal reflux disease)     on protonix  . Breast cancer   . Back pain   . Headache     Past Surgical History  Procedure Date  . Appendectomy   . Shoulder surgery   . Knee surgery     X2    Current Outpatient Prescriptions  Medication Sig Dispense Refill  . pantoprazole (PROTONIX) 40 MG tablet Take 40 mg by mouth daily.        . prenatal vitamin w/FE, FA (PRENATAL 1 + 1) 27-1 MG TABS Take 1 tablet by mouth daily.        . Probiotic Product (PROBIOTIC & ACIDOPHILUS EX ST) CAPS Take 1 capsule by mouth daily.           Allergies  Allergen Reactions  . Antiseptic Products, Misc.   . Penicillins      Family History  Problem Relation Age of Onset  . Cancer Paternal Aunt 45    Breast cancer (half sibling, related through father)  . Spina bifida Paternal Uncle 0    died around 7-8 months of age  . Cancer Maternal Grandmother 64    colon cancer  . Dementia Maternal Grandfather   . Cancer Paternal Grandmother     breast cancer between 38-42  . Cancer Paternal Grandfather     brain cancer  . Cancer Other     Paternal grandmother's siblings had breast cancer and pancreatic cancer  . Cancer Other 50    maternal great  grandmother with breast cancer     History   Social History  . Marital Status: Married    Spouse Name: N/A    Number of Children: N/A  . Years of Education: N/A   Social History Main Topics  . Smoking status: Never Smoker   . Smokeless tobacco: Not on file  . Alcohol Use: No  . Drug Use: No  . Sexually Active: Yes    Birth Control/ Protection: IUD   Other Topics Concern  . Not on file   Social History Narrative  . No narrative on file     REVIEW OF SYSTEMS - PERTINENT POSITIVES ONLY: 12 point review of systems negative other than HPI and PMH except for easy bruising, headaches, sleep loss.    EXAM: Wt Readings from Last 3 Encounters:  02/16/12 170 lb 3.2 oz (77.202 kg)  03/01/11 190 lb (86.183 kg)  05/24/07 160 lb 8 oz (72.802 kg)   Temp Readings from Last 3 Encounters:  02/16/12 98.2 F (36.8 C)   03/02/11 97.6 F (36.4 C) Oral     BP Readings from Last 3 Encounters:  02/16/12 108/72  03/02/11 138/87   Pulse Readings from Last 3 Encounters:  02/16/12 84  03/02/11 73  05/24/07 72    Gen:  No acute distress.  Well nourished and well groomed.   Neurological: Alert and oriented to person, place, and time. Coordination normal.  Head: Normocephalic and atraumatic.  Eyes: Conjunctivae are normal. Pupils are equal, round, and reactive to light. No scleral icterus.  Neck: Normal range of motion. Neck supple. No tracheal deviation or thyromegaly present.  Cardiovascular: Normal rate, regular rhythm, normal heart sounds and intact distal pulses.  Exam reveals no gallop and no friction rub.  No murmur heard. Respiratory: Effort normal.  No respiratory distress. No chest wall tenderness. Breath sounds normal.  No wheezes, rales or rhonchi.  Breast:  Right breast demonstrates peau d'orange and a 3 cm mass at 12 o'clock.  There is faint erythema along the medial breast.  There is no nipple retraction.  There is no nipple discharge.  There is no palpable mass at 9 o'clock.   There is a palpable right axillary node.   GI: Soft. Bowel sounds are normal. The abdomen is soft and nontender.  There is no rebound and no guarding.  Musculoskeletal: Normal range of motion. Extremities are nontender.  Lymphadenopathy: No cervical, preauricular, postauricular adenopathy is present Skin: Skin is warm and dry. No rash noted. No diaphoresis. No erythema. No pallor. No clubbing, cyanosis, or edema.   Psychiatric: Normal mood and affect, tearful. Behavior is normal. Judgment and thought content normal.    LABORATORY RESULTS: Available labs are reviewed  1. Breast, right, needle core biopsy, 10 o'clock - INVASIVE DUCTAL CARCINOMA. - DUCTAL CARCINOMA IN SITU. - SEE COMMENT. 2. Breast, right, needle core biopsy, 11 o'clock - INVASIVE DUCTAL CARCINOMA. - DUCTAL CARCINOMA IN SITU. - LYMPHOVASCULAR INVASION IS IDENTIFIED.  +/+/+, Ki 67 30%  RADIOLOGY RESULTS: See E-Chart or I-Site for most recent results.  Images and reports are reviewed. MRI IMPRESSION:  1. Irregular enhancing mass with distortion located within the  right breast at the 10 o'clock position corresponding to the  recently biopsied invasive ductal carcinoma measuring 3.1 x 1.2 x  2.2 cm in size.  2. Irregular enhancing mass located within the right breast at the  11 o'clock position corresponding to the recently biopsied invasive  ductal carcinoma measuring 2.9 x 3.5 x 2.3 cm in size.  3. Two adjacent abnormal appearing right lower axillary lymph  nodes located lateral to the pectoralis muscles worrisome for  metastatic lymph nodes.  4. Marked diffuse bilateral background parenchymal enhancement  which lowers MR sensitivity.    ASSESSMENT AND PLAN: Cancer of upper-outer quadrant of female breast, right breast.  mcT2N1M0 Pt with multifocal right breast cancer.  Both cancers are in the right upper outer quadrant, but they are very far apart, and this would yield poor cosmetic results.  Pt is also 36  years old, and has significant family history for cancer.  She would definitely like to pursue mastectomy on the right side.  She states that she would probably pursue left mastectomy as well.    She has some peau d'orange and some erythema on the medial right breast.   We will recommend neoadjuvant chemotherapy in order to get chemo started right away.   This way, we will get better condition of the skin, decrease in size of lymph nodes, and will give us time to get plastics and genetics testing.      She has palpable lymph nodes on the right.  These will likely lead to an ALND post neoadjuvant chemotherapy. She will also likely need radiation because of her nodes and her age.   We will refer her to plastics in around a month.  I will schedule her for a port a cath and skin punch biopsy.   I would like to see her back in around 3 months to plan surgery.   45 minutes total was spent on this visit.    Cullen Lahaie L Charma Mocarski MD Surgical Oncology, General and Endocrine Surgery [facility]      Visit Diagnoses: 1. Cancer of upper-outer quadrant of female breast, right breast.  mcT2N1M0     Primary Care Physician: Yvonne Lowne, DO    

## 2012-02-16 NOTE — Patient Instructions (Signed)
No aspirin before surgery.  Plan surgery next Monday for port a cath insertion.    Risks are pneumothorax, hemothorax (injury to top of lung or blood vessel), bleeding, infection, swelling.    Implanted Port Instructions An implanted port is a central line that has a round shape and is placed under the skin. It is used for long-term IV (intravenous) access for:  Medicine.   Fluids.   Liquid nutrition, such as TPN (total parenteral nutrition).   Blood samples.  Ports can be placed:  In the chest area just below the collarbone (this is the most common place.)   In the arms.   In the belly (abdomen) area.   In the legs.  PARTS OF THE PORT A port has 2 main parts:  The reservoir. The reservoir is round, disc-shaped, and will be a small, raised area under your skin.   The reservoir is the part where a needle is inserted (accessed) to either give medicines or to draw blood.   The catheter. The catheter is a long, slender tube that extends from the reservoir. The catheter is placed into a large vein.   Medicine that is inserted into the reservoir goes into the catheter and then into the vein.   INSERTION OF THE PORT  The port is surgically placed in either an operating room or in a procedural area (interventional radiology).   Medicine may be given to help you relax during the procedure.   The skin where the port will be inserted is numbed (local anesthetic).   1 or 2 small cuts (incisions) will be made in the skin to insert the port.   The port can be used after it has been inserted.  INCISION SITE CARE  The incision site may have small adhesive strips on it. This helps keep the incision site closed. Sometimes, no adhesive strips are placed. Instead of adhesive strips, a special kind of surgical glue is used to keep the incision closed.   If adhesive strips were placed on the incision sites, do not take them off. They will fall off on their own.   The incision site  may be sore for 1 to 2 days. Pain medicine can help.   Do not get the incision site wet. Bathe or shower as directed by your caregiver.   The incision site should heal in 5 to 7 days. A small scar may form after the incision has healed.   ACCESSING THE PORT Special steps must be taken to access the port:  Before the port is accessed, a numbing cream can be placed on the skin. This helps numb the skin over the port site.   A sterile technique is used to access the port.   The port is accessed with a needle. Only "non-coring" port needles should be used to access the port. Once the port is accessed, a blood return should be checked. This helps ensure the port is in the vein and is not clogged (clotted).   If your caregiver believes your port should remain accessed, a clear (transparent) bandage will be placed over the needle site. The bandage and needle will need to be changed every week or as directed by your caregiver.   Keep the bandage covering the needle clean and dry. Do not get it wet. Follow your caregiver's instructions on how to take a shower or bath when the port is accessed.   If your port does not need to stay accessed, no bandage is needed  over the port.   FLUSHING THE PORT Flushing the port keeps it from getting clogged. How often the port is flushed depends on:  If a constant infusion is running. If a constant infusion is running, the port may not need to be flushed.   If intermittent medicines are given.   If the port is not being used.  For intermittent medicines:  The port will need to be flushed:   After medicines have been given.   After blood has been drawn.   As part of routine maintenance.   A port is normally flushed with:   Normal saline.   Heparin.   Follow your caregiver's advice on how often, how much, and the type of flush to use on your port.   IMPORTANT PORT INFORMATION  Tell your caregiver if you are allergic to heparin.   After your  port is placed, you will get a manufacturer's information card. The card has information about your port. Keep this card with you at all times.   There are many types of ports available. Know what kind of port you have.   In case of an emergency, it may be helpful to wear a medical alert bracelet. This can help alert health care workers that you have a port.   The port can stay in for as long as your caregiver believes it is necessary.   When it is time for the port to come out, surgery will be done to remove it. The surgery will be similar to how the port was put in.   If you are in the hospital or clinic:   Your port will be taken care of and flushed by a nurse.   If you are at home:   A home health care nurse may give medicines and take care of the port.   You or a family member can get special training and directions for giving medicine and taking care of the port at home.   SEEK IMMEDIATE MEDICAL CARE IF:   Your port does not flush or you are unable to get a blood return.   New drainage or pus is coming from the incision.   A bad smell is coming from the incision site.   You develop swelling or increased redness at the incision site.   You develop increased swelling or pain at the port site.   You develop swelling or pain in the surrounding skin near the port.   You have an oral temperature above 102 F (38.9 C), not controlled by medicine.   MAKE SURE YOU:   Understand these instructions.   Will watch your condition.   Will get help right away if you are not doing well or get worse.

## 2012-02-16 NOTE — Progress Notes (Signed)
[facility] Radiation Oncology NEW PATIENT EVALUATION  Name: Jody Dunn MRN: [id number]  Date:   02/16/2012           DOB: [birth year]  Status: outpatient   CC: Loreen Freud, DO  Almond Lint, MD Ruthann Cancer, MD   REFERRING PHYSICIAN: Almond Lint, MD   DIAGNOSIS: The encounter diagnosis was Cancer of upper-outer quadrant of female breast, right breast.  mcT2N1M0.    HISTORY OF PRESENT ILLNESS:  Jody Dunn is a 36 y.o. female who is seen today in multidisciplinary breast clinic. The patient indicates that she felt a mass within the right breast a couple of weeks ago. She is also had a history of breast feeding at the current time and which she states that her child never does this on the right. The patient proceeded with a mammogram. The right breast was found to be heterogeneously dense and there was also some diffuse skin thickening. Pleomorphic calcifications were noted within the lateral aspect of the right breast. The extended over an area of approximately 11 cm. No definite mass was seen on mammogram. On ultrasound there was a hypoechoic mass at the 11:00 position measuring 13 mm in diameter. There was also a 10:00 position over a second palpable area which represented a hypoechoic mass measuring 17 mm in maximum dimension. There is also a single lymph node with mildly thickened cortex which was seen in the axilla. The patient proceeded to undergo a biopsy of both the canned and the 11:00 position tumors. 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 Lymphovascular space invasion was also seen from the lesion at the 11:00 position. Receptor studies indicated that the tumor is ER positive and PR positive. The Ki-67 was 30%. Amplification of HER-2/neu was also seen. The patient has subsequently proceeded to undergo an MRI scan of the breasts bilaterally. There was an irregular enhancing mass with distortion located within the right  breast at the 2:00 position. This measured 3.1 cm in maximum dimension. There was also an irregular enhancing mass within the right breast at the 11:00 position which measured 3.5 cm in maximum dimension. There were also 2 adjacent abnormal-appearing right lower axillary lymph nodes located lateral to the pectoralis muscle which was worrisome for metastatic lymph nodes. There is no evidence for internal mammary adenopathy or left axillary adenopathy. Some diffuse skin thickening was associated with the right breast.  The patient therefore had her case discussed at breast conference this morning and she is seen in multidisciplinary breast clinic today for consideration of radiation treatment as part of her overall treatment plan.  PREVIOUS RADIATION THERAPY: No   PAST MEDICAL HISTORY:  has a past medical history of Iron (Fe) deficiency anemia; Asthma; GERD (gastroesophageal reflux disease); Breast cancer; Back pain; Migraines; Hyperemesis gravidarum; and Lactose intolerance.     PAST SURGICAL HISTORY:  Past Surgical History  Procedure Date  . Appendectomy   . Shoulder surgery   . Knee surgery     X2     FAMILY HISTORY: family history includes Cancer in her other, paternal grandfather, and paternal grandmother; Cancer (age of onset:45) in her paternal aunt; Cancer (age of onset:50) in her other; Cancer (age of onset:64) in her maternal grandmother; Dementia in her maternal grandfather; and Spina bifida (age of onset:0) in her paternal uncle.   SOCIAL HISTORY:  reports that she has never smoked. She does not have any smokeless tobacco history on file. She reports that she drinks  about 2.5 - 3.5 ounces of alcohol per week. She reports that she does not use illicit drugs.   ALLERGIES: Antiseptic products, misc. and Penicillins   MEDICATIONS:  Current Outpatient Prescriptions  Medication Sig Dispense Refill  . pantoprazole (PROTONIX) 40 MG tablet Take 40 mg by mouth daily.        .  prenatal vitamin w/FE, FA (PRENATAL 1 + 1) 27-1 MG TABS Take 1 tablet by mouth daily.        . Probiotic Product (PROBIOTIC & ACIDOPHILUS EX ST) CAPS Take 1 capsule by mouth daily.           REVIEW OF SYSTEMS:  A comprehensive review of systems was negative.    PHYSICAL EXAM:  vitals were not taken for this visit.  General: Well-developed, in no acute distress HEENT: Normocephalic, atraumatic; oral cavity clear Neck: Supple without any lymphadenopathy Cardiovascular: Regular rate and rhythm Respiratory: Clear to auscultation bilaterally Breasts: Some edema was present within the right breast. Felt like there was a diffuse palpable area of abnormality, some of which may represent biopsy change, extending over an 8-10 cm region within the upper outer quadrant of the right breast. I cannot appreciate any definite axillary adenopathy on today's exam. Denies any clear skin involvement today on exam. No suspicious findings on the left GI: Soft, nontender, normal bowel sounds Extremities: No edema present Neuro: No focal deficits    LABORATORY DATA:  Lab Results  Component Value Date   WBC 7.4 02/16/2012   HGB 13.3 02/16/2012   HCT 39.6 02/16/2012   MCV 92.4 02/16/2012   PLT 307 02/16/2012   Lab Results  Component Value Date   NA 138 02/16/2012   K 3.4* 02/16/2012   CL 102 02/16/2012   CO2 24 02/16/2012   Lab Results  Component Value Date   ALT 12 02/16/2012   AST 19 02/16/2012   ALKPHOS 98 02/16/2012   BILITOT 0.2* 02/16/2012      IMPRESSION: Pleasant 36 year old female who is presenting with at least multifocal disease corresponding to 2 T2 tumors. Suspicious calcifications were seen over a fairly broad area on mammogram described is about 11 cm in maximum dimension. On MRI, suspicious lymph nodes were present on the right. The patient appears to be presenting with at least stage II disease.   PLAN: The patient's case was discussed in both conference and in multidisciplinary breast  clinic. The patient is quite young and appears to be presenting with locally advanced right-sided breast cancer. We discussed a possible course of neoadjuvant chemotherapy likely to be followed by mastectomy on the right. The patient is going to be seen by genetics as well.  Based on the patient's initial presentation, I believe that a course of postmastectomy radiation likely would be of benefit for the patient. I discussed this with her. We discussed the role of radiation in such a setting as well as the specific benefit of possible improvement in local and regional control. We discussed this would typically be given on a daily basis over 6-1/2 weeks. All of her questions were answered. We did discuss the possible side effects and risks of treatment. The patient as noted above will likely proceed with chemotherapy initially. I look forward to seeing the patient postoperatively to see how she is doing at that point and to further coordinate her care.   I spent 60 minutes minutes face to face with the patient and more than 50% of that time was spent in counseling and/or coordination  of care.

## 2012-02-17 ENCOUNTER — Telehealth: Payer: Self-pay | Admitting: *Deleted

## 2012-02-17 ENCOUNTER — Encounter: Payer: Self-pay | Admitting: *Deleted

## 2012-02-17 ENCOUNTER — Encounter (HOSPITAL_COMMUNITY): Payer: Self-pay | Admitting: *Deleted

## 2012-02-17 ENCOUNTER — Encounter: Payer: Self-pay | Admitting: Specialist

## 2012-02-17 ENCOUNTER — Telehealth: Payer: Self-pay | Admitting: Oncology

## 2012-02-17 NOTE — Progress Notes (Signed)
Mailed after appt letter to pt. 

## 2012-02-17 NOTE — Telephone Encounter (Signed)
gve the pt her July 2013 appt calendar. Pt is aware that she will receive a more defined appt calendar when she comes in for the chemo class

## 2012-02-17 NOTE — Telephone Encounter (Signed)
Per staff message I scheduled appts. JMW  

## 2012-02-17 NOTE — Telephone Encounter (Signed)
Message left by pt stating she has appointment conflicts on Monday 7/1.  This office scheduled echo for 1pm but she has been called by CSS and is scheduled for port placement that am.  Request return call per above for " which one to reschedule".  Call returned and obtained VM. Message left informing pt to maintain port placement appt and echo will be rescheduled.  Message left on VM for Crystal to reschedule echo appt per above and contact pt.

## 2012-02-17 NOTE — Progress Notes (Signed)
I met the patient and her husband and 18 month old child at the multidisciplinary clinic yesterday.  We reviewed her distress level, and I told her about available support services, specifically the [facility] and [facility].  I did make a referral to [facility] at her request.  The patient also has a 33-year-old child.

## 2012-02-21 ENCOUNTER — Ambulatory Visit (HOSPITAL_COMMUNITY): Payer: Managed Care, Other (non HMO)

## 2012-02-21 ENCOUNTER — Other Ambulatory Visit (HOSPITAL_COMMUNITY): Payer: Managed Care, Other (non HMO)

## 2012-02-21 ENCOUNTER — Encounter (HOSPITAL_COMMUNITY): Payer: Self-pay | Admitting: *Deleted

## 2012-02-21 ENCOUNTER — Other Ambulatory Visit: Payer: Self-pay | Admitting: Oncology

## 2012-02-21 ENCOUNTER — Encounter (HOSPITAL_COMMUNITY): Payer: Self-pay | Admitting: Pharmacy Technician

## 2012-02-21 ENCOUNTER — Telehealth: Payer: Self-pay | Admitting: Oncology

## 2012-02-21 ENCOUNTER — Encounter (HOSPITAL_COMMUNITY)
Admission: RE | Disposition: A | Discharge status: Home / Self Care | Payer: Self-pay | Source: Ambulatory Visit | Attending: General Surgery

## 2012-02-21 ENCOUNTER — Ambulatory Visit (HOSPITAL_COMMUNITY): Payer: Managed Care, Other (non HMO) | Admitting: Anesthesiology

## 2012-02-21 ENCOUNTER — Encounter (HOSPITAL_COMMUNITY): Payer: Self-pay | Admitting: Anesthesiology

## 2012-02-21 ENCOUNTER — Ambulatory Visit (HOSPITAL_COMMUNITY)
Admission: RE | Admit: 2012-02-21 | Discharge: 2012-02-21 | Disposition: A | Discharge status: Home / Self Care | Payer: Managed Care, Other (non HMO) | Source: Ambulatory Visit | Attending: General Surgery | Admitting: General Surgery

## 2012-02-21 ENCOUNTER — Encounter (HOSPITAL_COMMUNITY): Payer: Self-pay | Admitting: General Surgery

## 2012-02-21 DIAGNOSIS — Z79899 Other long term (current) drug therapy: Secondary | ICD-10-CM | POA: Insufficient documentation

## 2012-02-21 DIAGNOSIS — C50919 Malignant neoplasm of unspecified site of unspecified female breast: Secondary | ICD-10-CM | POA: Insufficient documentation

## 2012-02-21 DIAGNOSIS — C792 Secondary malignant neoplasm of skin: Secondary | ICD-10-CM | POA: Insufficient documentation

## 2012-02-21 DIAGNOSIS — D509 Iron deficiency anemia, unspecified: Secondary | ICD-10-CM | POA: Insufficient documentation

## 2012-02-21 DIAGNOSIS — K219 Gastro-esophageal reflux disease without esophagitis: Secondary | ICD-10-CM | POA: Insufficient documentation

## 2012-02-21 HISTORY — PX: SKIN BIOPSY: SHX1

## 2012-02-21 HISTORY — PX: PORTACATH PLACEMENT: SHX2246

## 2012-02-21 LAB — HCG, SERUM, QUALITATIVE: Preg, Serum: NEGATIVE

## 2012-02-21 SURGERY — INSERTION, TUNNELED CENTRAL VENOUS DEVICE, WITH PORT
Anesthesia: General | Site: Breast | Laterality: Right | Wound class: Clean

## 2012-02-21 MED ORDER — BUPIVACAINE HCL (PF) 0.25 % IJ SOLN
INTRAMUSCULAR | Status: DC | PRN
  Administered 2012-02-21: 9 mL

## 2012-02-21 MED ORDER — PROMETHAZINE HCL 25 MG/ML IJ SOLN: 6.2500 mg | INTRAMUSCULAR | Status: DC | PRN

## 2012-02-21 MED ORDER — LIDOCAINE HCL 1 % IJ SOLN
INTRAMUSCULAR | Status: AC
  Filled 2012-02-21: qty 20

## 2012-02-21 MED ORDER — SODIUM CHLORIDE 0.9 % IJ SOLN: 3.0000 mL | Freq: Two times a day (BID) | INTRAMUSCULAR | Status: DC

## 2012-02-21 MED ORDER — LACTATED RINGERS IV SOLN
INTRAVENOUS | Status: DC | PRN
  Administered 2012-02-21: 07:00:00 via INTRAVENOUS

## 2012-02-21 MED ORDER — HEPARIN SODIUM (PORCINE) 5000 UNIT/ML IJ SOLN
INTRAMUSCULAR | Status: DC | PRN
  Administered 2012-02-21: 08:00:00

## 2012-02-21 MED ORDER — OXYCODONE HCL 5 MG PO TABS: 5.0000 mg | ORAL_TABLET | ORAL | Status: DC | PRN

## 2012-02-21 MED ORDER — ACETAMINOPHEN 650 MG RE SUPP
650.0000 mg | RECTAL | Status: DC | PRN
  Filled 2012-02-21: qty 1

## 2012-02-21 MED ORDER — OXYCODONE-ACETAMINOPHEN 5-325 MG PO TABS: 1.0000 | ORAL_TABLET | ORAL | Status: AC | PRN

## 2012-02-21 MED ORDER — LIDOCAINE-EPINEPHRINE (PF) 1 %-1:200000 IJ SOLN
INTRAMUSCULAR | Status: DC | PRN
  Administered 2012-02-21: 9 mL

## 2012-02-21 MED ORDER — SODIUM CHLORIDE 0.9 % IV SOLN: 250.0000 mL | INTRAVENOUS | Status: DC | PRN

## 2012-02-21 MED ORDER — DEXAMETHASONE SODIUM PHOSPHATE 4 MG/ML IJ SOLN
INTRAMUSCULAR | Status: DC | PRN
  Administered 2012-02-21: 10 mg via INTRAVENOUS

## 2012-02-21 MED ORDER — ACETAMINOPHEN 10 MG/ML IV SOLN
INTRAVENOUS | Status: DC | PRN
  Administered 2012-02-21: 1000 mg via INTRAVENOUS

## 2012-02-21 MED ORDER — ONDANSETRON HCL 4 MG/2ML IJ SOLN
INTRAMUSCULAR | Status: DC | PRN
  Administered 2012-02-21: 4 mg via INTRAVENOUS

## 2012-02-21 MED ORDER — 0.9 % SODIUM CHLORIDE (POUR BTL) OPTIME
TOPICAL | Status: DC | PRN
  Administered 2012-02-21: 1000 mL

## 2012-02-21 MED ORDER — PROPOFOL 10 MG/ML IV EMUL
INTRAVENOUS | Status: DC | PRN
  Administered 2012-02-21: 200 mg via INTRAVENOUS

## 2012-02-21 MED ORDER — ACETAMINOPHEN 325 MG PO TABS: 650.0000 mg | ORAL_TABLET | ORAL | Status: DC | PRN

## 2012-02-21 MED ORDER — HEPARIN SOD (PORK) LOCK FLUSH 100 UNIT/ML IV SOLN
INTRAVENOUS | Status: AC
  Filled 2012-02-21: qty 5

## 2012-02-21 MED ORDER — BUPIVACAINE-EPINEPHRINE PF 0.25-1:200000 % IJ SOLN
INTRAMUSCULAR | Status: AC
  Filled 2012-02-21: qty 30

## 2012-02-21 MED ORDER — SODIUM CHLORIDE 0.9 % IR SOLN
Freq: Once | Status: DC
  Filled 2012-02-21: qty 1.2

## 2012-02-21 MED ORDER — CIPROFLOXACIN IN D5W 400 MG/200ML IV SOLN
INTRAVENOUS | Status: AC
  Filled 2012-02-21: qty 200

## 2012-02-21 MED ORDER — SODIUM CHLORIDE 0.9 % IJ SOLN: 3.0000 mL | INTRAMUSCULAR | Status: DC | PRN

## 2012-02-21 MED ORDER — FENTANYL CITRATE 0.05 MG/ML IJ SOLN: 25.0000 ug | INTRAMUSCULAR | Status: DC | PRN

## 2012-02-21 MED ORDER — FENTANYL CITRATE 0.05 MG/ML IJ SOLN
INTRAMUSCULAR | Status: DC | PRN
  Administered 2012-02-21 (×2): 50 ug via INTRAVENOUS

## 2012-02-21 MED ORDER — KETOROLAC TROMETHAMINE 30 MG/ML IJ SOLN: 15.0000 mg | Freq: Once | INTRAMUSCULAR | Status: DC | PRN

## 2012-02-21 MED ORDER — ONDANSETRON HCL 4 MG/2ML IJ SOLN: 4.0000 mg | Freq: Four times a day (QID) | INTRAMUSCULAR | Status: DC | PRN

## 2012-02-21 MED ORDER — CIPROFLOXACIN IN D5W 400 MG/200ML IV SOLN
400.0000 mg | INTRAVENOUS | Status: AC
  Administered 2012-02-21: 400 mg via INTRAVENOUS

## 2012-02-21 MED ORDER — HEPARIN SOD (PORK) LOCK FLUSH 100 UNIT/ML IV SOLN
INTRAVENOUS | Status: DC | PRN
  Administered 2012-02-21: 500 [IU] via INTRAVENOUS

## 2012-02-21 MED ORDER — LIDOCAINE HCL (CARDIAC) 20 MG/ML IV SOLN
INTRAVENOUS | Status: DC | PRN
  Administered 2012-02-21: 100 mg via INTRAVENOUS

## 2012-02-21 MED ORDER — MUPIROCIN 2 % EX OINT
TOPICAL_OINTMENT | CUTANEOUS | Status: AC
  Filled 2012-02-21: qty 22

## 2012-02-21 MED ORDER — ACETAMINOPHEN 10 MG/ML IV SOLN
INTRAVENOUS | Status: AC
  Filled 2012-02-21: qty 100

## 2012-02-21 SURGICAL SUPPLY — 39 items
BLADE HEX COATED 2.75 (ELECTRODE) ×3 IMPLANT
BLADE SURG 15 STRL LF DISP TIS (BLADE) ×2 IMPLANT
BLADE SURG 15 STRL SS (BLADE) ×1
BLADE SURG SZ11 CARB STEEL (BLADE) ×3 IMPLANT
CANISTER SUCTION 2500CC (MISCELLANEOUS) ×3 IMPLANT
CHLORAPREP W/TINT 26ML (MISCELLANEOUS) IMPLANT
CLOTH BEACON ORANGE TIMEOUT ST (SAFETY) ×3 IMPLANT
DECANTER SPIKE VIAL GLASS SM (MISCELLANEOUS) ×3 IMPLANT
DERMABOND ADVANCED (GAUZE/BANDAGES/DRESSINGS)
DERMABOND ADVANCED .7 DNX12 (GAUZE/BANDAGES/DRESSINGS) IMPLANT
DRAPE C-ARM 42X72 X-RAY (DRAPES) ×3 IMPLANT
DRAPE PED LAPAROTOMY (DRAPES) IMPLANT
ELECT REM PT RETURN 9FT ADLT (ELECTROSURGICAL) ×3
ELECTRODE REM PT RTRN 9FT ADLT (ELECTROSURGICAL) ×2 IMPLANT
GAUZE SPONGE 4X4 16PLY XRAY LF (GAUZE/BANDAGES/DRESSINGS) ×3 IMPLANT
GLOVE BIO SURGEON STRL SZ 6 (GLOVE) ×3 IMPLANT
GLOVE BIOGEL PI IND STRL 6.5 (GLOVE) ×2 IMPLANT
GLOVE BIOGEL PI IND STRL 7.0 (GLOVE) ×2 IMPLANT
GLOVE BIOGEL PI INDICATOR 6.5 (GLOVE) ×1
GLOVE BIOGEL PI INDICATOR 7.0 (GLOVE) ×1
GLOVE INDICATOR 6.5 STRL GRN (GLOVE) ×3 IMPLANT
GOWN PREVENTION PLUS XXLARGE (GOWN DISPOSABLE) ×3 IMPLANT
INTRODUCER 13FR (MISCELLANEOUS) ×3 IMPLANT
KIT BASIN OR (CUSTOM PROCEDURE TRAY) ×3 IMPLANT
KIT POWER CATH 8FR (Catheter) ×6 IMPLANT
NEEDLE HYPO 22GX1.5 SAFETY (NEEDLE) ×3 IMPLANT
NEEDLE HYPO 25X1 1.5 SAFETY (NEEDLE) IMPLANT
PACK UNIVERSAL I (CUSTOM PROCEDURE TRAY) ×3 IMPLANT
PENCIL BUTTON HOLSTER BLD 10FT (ELECTRODE) ×3 IMPLANT
PUNCH BIOPSY 3 (MISCELLANEOUS) ×3 IMPLANT
SUT MNCRL AB 4-0 PS2 18 (SUTURE) ×6 IMPLANT
SUT PROLENE 2 0 SH DA (SUTURE) ×6 IMPLANT
SUT VIC AB 3-0 SH 27 (SUTURE) ×1
SUT VIC AB 3-0 SH 27XBRD (SUTURE) ×2 IMPLANT
SYR BULB IRRIGATION 50ML (SYRINGE) IMPLANT
SYR CONTROL 10ML LL (SYRINGE) ×3 IMPLANT
TOWEL OR 17X26 10 PK STRL BLUE (TOWEL DISPOSABLE) ×3 IMPLANT
YANKAUER SUCT BULB TIP 10FT TU (MISCELLANEOUS) IMPLANT
power port ×3 IMPLANT

## 2012-02-21 NOTE — Discharge Instructions (Signed)
[facility] Office Phone Number [phone]   POST OP INSTRUCTIONS  Always review your discharge instruction sheet given to you by the facility where your surgery was performed.  IF YOU HAVE DISABILITY OR FAMILY LEAVE FORMS, YOU MUST BRING THEM TO THE OFFICE FOR PROCESSING.  DO NOT GIVE THEM TO YOUR DOCTOR.  1. A prescription for pain medication may be given to you upon discharge.  Take your pain medication as prescribed, if needed.  If narcotic pain medicine is not needed, then you may take acetaminophen (Tylenol) or ibuprofen (Advil) as needed. 2. Take your usually prescribed medications unless otherwise directed 3. If you need a refill on your pain medication, please contact your pharmacy.  They will contact our office to request authorization.  Prescriptions will not be filled after 5pm or on week-ends. 4. You should eat very light the first 24 hours after surgery, such as soup, crackers, pudding, etc.  Resume your normal diet the day after surgery 5. It is common to experience some constipation if taking pain medication after surgery.  Increasing fluid intake and taking a stool softener will usually help or prevent this problem from occurring.  A mild laxative (Milk of Magnesia or Miralax) should be taken according to package directions if there are no bowel movements after 48 hours. 6. You may shower in 48 hours.  The surgical glue will flake off in 2-3 weeks.   7. ACTIVITIES:  No strenuous activity or heavy lifting for 1 week.   a. You may drive when you no longer are taking prescription pain medication, you can comfortably wear a seatbelt, and you can safely maneuver your car and apply brakes. b. RETURN TO WORK:  _________1 if able with chemotherapy, otherwise per Dr. Magrinat_____________ Jody Dunn should see your doctor in the office for a follow-up appointment approximately three-four weeks after your surgery.    WHEN TO CALL YOUR DOCTOR: 1. Fever over 101.0 2. Nausea  and/or vomiting. 3. Extreme swelling or bruising. 4. Continued bleeding from incision. 5. Increased pain, redness, or drainage from the incision.  The clinic staff is available to answer your questions during regular business hours.  Please don't hesitate to call and ask to speak to one of the nurses for clinical concerns.  If you have a medical emergency, go to the nearest emergency room or call 911.  A surgeon from [facility] is always on call at the hospital.  For further questions, please visit [email or web address]

## 2012-02-21 NOTE — H&P (View-Only) (Signed)
Chief complaint:  Right breast cancer.  HISTORY: Pt is a 36 year old female who presents with a palpable mass in the right breast at 12 o'clock.  She noted this around 2 weeks ago, and she was set up for imaging.  There was a second mass detected on ultrasound, and MRI confirms this.   Both biopsies are positive for cancer.   She also has some skin dimpling, and some erythema of skin.  She has a 78 month old and has been breastfeeding.  She is a G2P2, and has IUD.  She is not pregnant and has not used oral contraceptives for birth control.  She has a strong family history of cancer and breast cancer.  She does not have breast pain.    Past Medical History  Diagnosis Date  . Iron (Fe) deficiency anemia   . Asthma     exercise induced  . GERD (gastroesophageal reflux disease)     on protonix  . Breast cancer   . Back pain   . Headache     Past Surgical History  Procedure Date  . Appendectomy   . Shoulder surgery   . Knee surgery     X2    Current Outpatient Prescriptions  Medication Sig Dispense Refill  . pantoprazole (PROTONIX) 40 MG tablet Take 40 mg by mouth daily.        . prenatal vitamin w/FE, FA (PRENATAL 1 + 1) 27-1 MG TABS Take 1 tablet by mouth daily.        . Probiotic Product (PROBIOTIC & ACIDOPHILUS EX ST) CAPS Take 1 capsule by mouth daily.           Allergies  Allergen Reactions  . Antiseptic Products, Misc.   Marland Kitchen Penicillins      Family History  Problem Relation Age of Onset  . Cancer Paternal Aunt 56    Breast cancer (half sibling, related through father)  . Spina bifida Paternal Uncle 0    died around 53-47 months of age  . Cancer Maternal Grandmother 41    colon cancer  . Dementia Maternal Grandfather   . Cancer Paternal Grandmother     breast cancer between 86-42  . Cancer Paternal Grandfather     brain cancer  . Cancer Other     Paternal grandmother's siblings had breast cancer and pancreatic cancer  . Cancer Other 50    maternal great  grandmother with breast cancer     History   Social History  . Marital Status: Married    Spouse Name: N/A    Number of Children: N/A  . Years of Education: N/A   Social History Main Topics  . Smoking status: Never Smoker   . Smokeless tobacco: Not on file  . Alcohol Use: No  . Drug Use: No  . Sexually Active: Yes    Birth Control/ Protection: IUD   Other Topics Concern  . Not on file   Social History Narrative  . No narrative on file     REVIEW OF SYSTEMS - PERTINENT POSITIVES ONLY: 12 point review of systems negative other than HPI and PMH except for easy bruising, headaches, sleep loss.    EXAM: Wt Readings from Last 3 Encounters:  02/16/12 170 lb 3.2 oz (77.202 kg)  03/01/11 190 lb (86.183 kg)  05/24/07 160 lb 8 oz (72.802 kg)   Temp Readings from Last 3 Encounters:  02/16/12 98.2 F (36.8 C)   03/02/11 97.6 F (36.4 C) Oral  BP Readings from Last 3 Encounters:  02/16/12 108/72  03/02/11 138/87   Pulse Readings from Last 3 Encounters:  02/16/12 84  03/02/11 73  05/24/07 72    Gen:  No acute distress.  Well nourished and well groomed.   Neurological: Alert and oriented to person, place, and time. Coordination normal.  Head: Normocephalic and atraumatic.  Eyes: Conjunctivae are normal. Pupils are equal, round, and reactive to light. No scleral icterus.  Neck: Normal range of motion. Neck supple. No tracheal deviation or thyromegaly present.  Cardiovascular: Normal rate, regular rhythm, normal heart sounds and intact distal pulses.  Exam reveals no gallop and no friction rub.  No murmur heard. Respiratory: Effort normal.  No respiratory distress. No chest wall tenderness. Breath sounds normal.  No wheezes, rales or rhonchi.  Breast:  Right breast demonstrates peau d'orange and a 3 cm mass at 12 o'clock.  There is faint erythema along the medial breast.  There is no nipple retraction.  There is no nipple discharge.  There is no palpable mass at 9 o'clock.   There is a palpable right axillary node.   GI: Soft. Bowel sounds are normal. The abdomen is soft and nontender.  There is no rebound and no guarding.  Musculoskeletal: Normal range of motion. Extremities are nontender.  Lymphadenopathy: No cervical, preauricular, postauricular adenopathy is present Skin: Skin is warm and dry. No rash noted. No diaphoresis. No erythema. No pallor. No clubbing, cyanosis, or edema.   Psychiatric: Normal mood and affect, tearful. Behavior is normal. Judgment and thought content normal.    LABORATORY RESULTS: Available labs are reviewed  1. Breast, right, needle core biopsy, 10 o'clock - INVASIVE DUCTAL CARCINOMA. - DUCTAL CARCINOMA IN SITU. - SEE COMMENT. 2. Breast, right, needle core biopsy, 11 o'clock - INVASIVE DUCTAL CARCINOMA. - DUCTAL CARCINOMA IN SITU. - LYMPHOVASCULAR INVASION IS IDENTIFIED.  +/+/+, Ki 67 30%  RADIOLOGY RESULTS: See E-Chart or I-Site for most recent results.  Images and reports are reviewed. MRI IMPRESSION:  1. Irregular enhancing mass with distortion located within the  right breast at the 10 o'clock position corresponding to the  recently biopsied invasive ductal carcinoma measuring 3.1 x 1.2 x  2.2 cm in size.  2. Irregular enhancing mass located within the right breast at the  11 o'clock position corresponding to the recently biopsied invasive  ductal carcinoma measuring 2.9 x 3.5 x 2.3 cm in size.  3. Two adjacent abnormal appearing right lower axillary lymph  nodes located lateral to the pectoralis muscles worrisome for  metastatic lymph nodes.  4. Marked diffuse bilateral background parenchymal enhancement  which lowers MR sensitivity.    ASSESSMENT AND PLAN: Cancer of upper-outer quadrant of female breast, right breast.  mcT2N1M0 Pt with multifocal right breast cancer.  Both cancers are in the right upper outer quadrant, but they are very far apart, and this would yield poor cosmetic results.  Pt is also 36  years old, and has significant family history for cancer.  She would definitely like to pursue mastectomy on the right side.  She states that she would probably pursue left mastectomy as well.    She has some peau d'orange and some erythema on the medial right breast.   We will recommend neoadjuvant chemotherapy in order to get chemo started right away.   This way, we will get better condition of the skin, decrease in size of lymph nodes, and will give [place] time to get plastics and genetics testing.  She has palpable lymph nodes on the right.  These will likely lead to an ALND post neoadjuvant chemotherapy. She will also likely need radiation because of her nodes and her age.   We will refer her to plastics in around a month.  I will schedule her for a port a cath and skin punch biopsy.   I would like to see her back in around 3 months to plan surgery.   45 minutes total was spent on this visit.    Maudry Diego MD Surgical Oncology, General and Endocrine Surgery [facility]      Visit Diagnoses: 1. Cancer of upper-outer quadrant of female breast, right breast.  mcT2N1M0     Primary Care Physician: Loreen Freud, DO

## 2012-02-21 NOTE — Telephone Encounter (Signed)
lmonvm adviising the pt of her md appt on 02/29/2012@7 :45am

## 2012-02-21 NOTE — Op Note (Signed)
PREOPERATIVE DIAGNOSIS:  Right multifocal breast cancer     POSTOPERATIVE DIAGNOSIS:  Same     PROCEDURE: Left subclavian ultrasound-guided port placement, Bard   Power Port, MRI safe, 8-French. 3 mm punch biopsy right breast skin.       SURGEON:  Almond Lint, MD      ANESTHESIA:  General   FINDINGS:  Good venous return, easy flush, and tip of the catheter and   SVC 22 cm long, cut at 23 cm mark      SPECIMEN:  None.      ESTIMATED BLOOD LOSS:  Minimal.      COMPLICATIONS:  None known.      PROCEDURE:  Pt was identified in the holding area and taken to   the operating room, where patient was placed supine on the operating room   table.  MAC anesthesia was induced.  Patient's arms were tucked and the upper   chest and neck were prepped and draped in sterile fashion.  Time-out was   performed according to the surgical safety check list.  When all was   correct, we continued.     With separate instruments, a 3 mm punch biopsy of the medial right breast was performed.  This was passed off the table.  The skin defect was closed with a simple 4-0 monocryl stitch.    Local anesthetic was administered over this   area at the angle of the clavicle.  The vein was accessed with 2 passes of the needle. There was good venous return and the wire passed easily with no ectopy.   Fluoroscopy was used to confirm that the wire was in the vena cava.      The patient was placed back level and the area for the pocket was anethetized   with local anesthetic.  A 3-cm transverse incision was made with a #15   blade.  Cautery was used to divide the subcutaneous tissues down to the   pectoralis muscle.  An [employer] retractor was used to elevate the skin   while a pocket was created on top of the pectoralis fascia.  The port   was placed into the pocket to confirm that it was of adequate size.  The   catheter was preattached to the port.  The port was then secured to the   pectoralis fascia with four  2-0 Prolene sutures.  These were clamped and   not tied down yet.    The catheter was tunneled through to the wire exit   site.  The catheter was placed along the wire to determine what length it should be to be in the SVC.  The catheter was cut at 22 cm.  The tunneler sheath and dilator were passed over the wire and the dilator and wire were removed.  The catheter was advanced through the tunneler sheath, but the catheter would not advance all the way.  The catheter was disconnected from the port and the wire passed back through it.  A new tunneler sheath was obtained.  The other end of the catheter was selected and trimmed appropriately.  The tunneler had to pulled back around 4 cm to get the catheter to thread.  Then, the tunneler sheath was pulled away.  Care was taken to keep the catheter in the tunneler sheath as this occurred. This was advanced and the tunneler sheath was removed.  There was good venous   return and easy flush of the catheter.  The Prolene sutures  were tied   down to the pectoral fascia.  The skin was reapproximated using 3-0   Vicryl interrupted deep dermal sutures.    Fluoroscopy was used to re-confirm good position of the catheter at the SVC, RA junction.  The skin   was then closed using 4-0 Monocryl in a subcuticular fashion.  The port was flushed with concentrated heparin flush as well.  The wounds were then cleaned, dried, and dressed with Dermabond.  The patient was awakened from anesthesia and taken to the PACU in stable condition.  Needle, sponge, and instrument counts were correct.               Almond Lint, MD

## 2012-02-21 NOTE — Progress Notes (Signed)
Power port booklet given to pt. Teach back method used

## 2012-02-21 NOTE — Anesthesia Preprocedure Evaluation (Addendum)
Anesthesia Evaluation  Patient identified by MRN, date of birth, ID band Patient awake    Reviewed: Allergy & Precautions, H&P , NPO status , Patient's Chart, lab work & pertinent test results  Airway Mallampati: II TM Distance: <3 FB Neck ROM: Full    Dental No notable dental hx.    Pulmonary neg pulmonary ROS,  breath sounds clear to auscultation  Pulmonary exam normal       Cardiovascular negative cardio ROS  Rhythm:Regular Rate:Normal     Neuro/Psych negative neurological ROS  negative psych ROS   GI/Hepatic Neg liver ROS, GERD-  Medicated,  Endo/Other  negative endocrine ROS  Renal/GU negative Renal ROS  negative genitourinary   Musculoskeletal negative musculoskeletal ROS (+)   Abdominal   Peds negative pediatric ROS (+)  Hematology negative hematology ROS (+)   Anesthesia Other Findings   Reproductive/Obstetrics negative OB ROS                           Anesthesia Physical Anesthesia Plan  ASA: II  Anesthesia Plan: General   Post-op Pain Management:    Induction: Intravenous  Airway Management Planned: LMA  Additional Equipment:   Intra-op Plan:   Post-operative Plan:   Informed Consent: I have reviewed the patients History and Physical, chart, labs and discussed the procedure including the risks, benefits and alternatives for the proposed anesthesia with the patient or authorized representative who has indicated his/her understanding and acceptance.   Dental advisory given  Plan Discussed with: CRNA  Anesthesia Plan Comments:         Anesthesia Quick Evaluation  

## 2012-02-21 NOTE — Interval H&P Note (Signed)
History and Physical Interval Note:  02/21/2012 7:48 AM  Jody Dunn  has presented today for surgery, with the diagnosis of right breast cancer  The various methods of treatment have been discussed with the patient and family. After consideration of risks, benefits and other options for treatment, the patient has consented to  Procedure(s) (LRB): INSERTION PORT-A-CATH (N/A) and skin biopsy right breast as a surgical intervention .  The patient's history has been reviewed, patient examined, no change in status, stable for surgery.  I have reviewed the patients' chart and labs.  Questions were answered to the patient's satisfaction.     [name]

## 2012-02-21 NOTE — Anesthesia Postprocedure Evaluation (Signed)
  Anesthesia Post-op Note  Patient: Jody Dunn  Procedure(s) Performed: Procedure(s) (LRB): INSERTION PORT-A-CATH (N/A) BIOPSY SKIN (Right)  Patient Location: PACU  Anesthesia Type: General  Level of Consciousness: awake and alert   Airway and Oxygen Therapy: Patient Spontanous Breathing  Post-op Pain: mild  Post-op Assessment: Post-op Vital signs reviewed, Patient's Cardiovascular Status Stable, Respiratory Function Stable, Patent Airway and No signs of Nausea or vomiting  Post-op Vital Signs: stable  Complications: No apparent anesthesia complications

## 2012-02-21 NOTE — Transfer of Care (Signed)
Immediate Anesthesia Transfer of Care Note  Patient: Jody Dunn  Procedure(s) Performed: Procedure(s) (LRB): INSERTION PORT-A-CATH (N/A) BIOPSY SKIN (Right)  Patient Location: PACU  Anesthesia Type: General  Level of Consciousness: sedated, patient cooperative and responds to stimulaton  Airway & Oxygen Therapy: Patient Spontanous Breathing and Patient connected to face mask oxgen  Post-op Assessment: Report given to PACU RN and Post -op Vital signs reviewed and stable  Post vital signs: Reviewed and stable  Complications: No apparent anesthesia complications

## 2012-02-22 ENCOUNTER — Encounter: Payer: Self-pay | Admitting: *Deleted

## 2012-02-22 ENCOUNTER — Telehealth: Payer: Self-pay | Admitting: Oncology

## 2012-02-22 ENCOUNTER — Other Ambulatory Visit: Payer: Managed Care, Other (non HMO)

## 2012-02-22 NOTE — Telephone Encounter (Signed)
I called the patient to inquire about a consult with Dr. Odis Luster.  She would like his office to call her to schedule so she can coordinate with her other appts.  I talked to Bowman who will contact her after info is faxed.

## 2012-02-22 NOTE — Progress Notes (Signed)
Faxed records to Dr. Odis Luster office.

## 2012-02-22 NOTE — Telephone Encounter (Signed)
gve the pt her July,aug 2013 appt calendar. °

## 2012-02-23 ENCOUNTER — Other Ambulatory Visit: Payer: Self-pay | Admitting: *Deleted

## 2012-02-23 ENCOUNTER — Ambulatory Visit (HOSPITAL_COMMUNITY)
Admission: RE | Admit: 2012-02-23 | Discharge: 2012-02-23 | Disposition: A | Discharge status: Home / Self Care | Payer: Managed Care, Other (non HMO) | Source: Ambulatory Visit | Attending: Oncology | Admitting: Oncology

## 2012-02-23 ENCOUNTER — Other Ambulatory Visit: Payer: Self-pay | Admitting: Oncology

## 2012-02-23 ENCOUNTER — Telehealth (INDEPENDENT_AMBULATORY_CARE_PROVIDER_SITE_OTHER): Payer: Self-pay | Admitting: General Surgery

## 2012-02-23 DIAGNOSIS — O219 Vomiting of pregnancy, unspecified: Secondary | ICD-10-CM | POA: Insufficient documentation

## 2012-02-23 DIAGNOSIS — G43909 Migraine, unspecified, not intractable, without status migrainosus: Secondary | ICD-10-CM | POA: Insufficient documentation

## 2012-02-23 DIAGNOSIS — C50919 Malignant neoplasm of unspecified site of unspecified female breast: Secondary | ICD-10-CM | POA: Insufficient documentation

## 2012-02-23 DIAGNOSIS — Z01818 Encounter for other preprocedural examination: Secondary | ICD-10-CM | POA: Insufficient documentation

## 2012-02-23 DIAGNOSIS — O99891 Other specified diseases and conditions complicating pregnancy: Secondary | ICD-10-CM | POA: Insufficient documentation

## 2012-02-23 DIAGNOSIS — D649 Anemia, unspecified: Secondary | ICD-10-CM | POA: Insufficient documentation

## 2012-02-23 DIAGNOSIS — Z09 Encounter for follow-up examination after completed treatment for conditions other than malignant neoplasm: Secondary | ICD-10-CM

## 2012-02-23 DIAGNOSIS — O99019 Anemia complicating pregnancy, unspecified trimester: Secondary | ICD-10-CM | POA: Insufficient documentation

## 2012-02-23 MED ORDER — LORAZEPAM 0.5 MG PO TABS: 0.5000 mg | ORAL_TABLET | Freq: Every evening | ORAL | Status: AC | PRN

## 2012-02-23 MED ORDER — PROCHLORPERAZINE MALEATE 10 MG PO TABS: 10.0000 mg | ORAL_TABLET | Freq: Four times a day (QID) | ORAL | Status: DC | PRN

## 2012-02-23 NOTE — Telephone Encounter (Signed)
Informed pt of skin biopsy result.  Pt with inflammatory cancer.  Pt set up for staging studies next week and to see Dr. Darnelle Catalan.

## 2012-02-28 ENCOUNTER — Encounter (HOSPITAL_COMMUNITY)
Admission: RE | Admit: 2012-02-28 | Discharge: 2012-02-28 | Disposition: A | Discharge status: Home / Self Care | Payer: Managed Care, Other (non HMO) | Source: Ambulatory Visit | Attending: Oncology | Admitting: Oncology

## 2012-02-28 ENCOUNTER — Encounter (HOSPITAL_COMMUNITY): Payer: Self-pay | Admitting: General Surgery

## 2012-02-28 DIAGNOSIS — C50919 Malignant neoplasm of unspecified site of unspecified female breast: Secondary | ICD-10-CM

## 2012-02-28 MED ORDER — FLUDEOXYGLUCOSE F - 18 (FDG) INJECTION
13.0000 | Freq: Once | INTRAVENOUS | Status: AC | PRN
  Administered 2012-02-28: 13 via INTRAVENOUS

## 2012-02-28 MED ORDER — IOHEXOL 300 MG/ML  SOLN
80.0000 mL | Freq: Once | INTRAMUSCULAR | Status: AC | PRN
  Administered 2012-02-28: 80 mL via INTRAVENOUS

## 2012-02-29 ENCOUNTER — Other Ambulatory Visit: Payer: Self-pay | Admitting: *Deleted

## 2012-02-29 ENCOUNTER — Ambulatory Visit (HOSPITAL_BASED_OUTPATIENT_CLINIC_OR_DEPARTMENT_OTHER): Payer: Managed Care, Other (non HMO) | Admitting: Oncology

## 2012-02-29 ENCOUNTER — Encounter (INDEPENDENT_AMBULATORY_CARE_PROVIDER_SITE_OTHER): Payer: Managed Care, Other (non HMO)

## 2012-02-29 VITALS — BP 120/81 | HR 88 | Temp 98.7°F | Ht 67.0 in | Wt 172.7 lb

## 2012-02-29 DIAGNOSIS — Z170 Estrogen receptor positive status [ER+]: Secondary | ICD-10-CM

## 2012-02-29 DIAGNOSIS — C50419 Malignant neoplasm of upper-outer quadrant of unspecified female breast: Secondary | ICD-10-CM

## 2012-02-29 NOTE — Progress Notes (Signed)
ID: [name]   DOB: [birth year]  MR#: [id number]  WUJ#:[id number]  HISTORY OF PRESENT ILLNESS: Siennah had a mammogram at [facility] of 2010 because of shooting pains in the right breast. This found no worrisome finding. More recently, in the spring of 2012, the patient at delivered her second child. She nursed but the child refused to take milk from the right breast. The patient tells me that she has been able to palpate easily from the right breast, so production is not an issue. More recently, early June 2013, the patient felt a new lump in the right breast, and brought this to the attention of [name] at [facility]. The patient had repeat mammography at [facility] 02/07/2012. This found at the breast tissue to be heterogeneously dense, which is not a surprise given that the patient is lactating. There was diffuse skin thickening. Erythema is not described. The nipple was retracted. There was no definitive peau d'orange appearance. Pleomorphic calcifications in the lateral right breast extended approximately 11 cm, without definite mass. On ultrasound there was a vague irregular hypoechoic mass measuring approximately 15 mm in the right breast, with a second ill-defined hypoechoic lobulated mass measuring approximately 17 mm. Both these masses were biopsied 02/09/2012, and the final pathology ([id number]) showed both IIb invasive ductal carcinoma, with a prognostic panel (from the mass at 11:00) as follows: estrogen receptor positive at 90%, progesterone receptor positive at 90%, Mib-1 of 30%, and HER-2 amplification with a ratio of 4.29 by CISH. MRI of the breast was performed 02/15/2012 and showed the mass in the 10:00 position to measure 3.1 cm, the one at the 11:00 position to measure 3.1 cm. There was diffuse skin thickening in the right breast and 2 enhancing abnormal-appearing lower right axillary lymph nodes measuring 2.0 and 1.8 cm respectively. There was no evidence for internal mammary  adenopathy, left axillary adenopathy, or significant findings in the left breast.  INTERVAL HISTORY: [name] returns today with her daughter Marcelle Smiling to discuss results of her staging studies and appropriate use of antinausea medications. She had her PET scan and CT of the chest, as well as a biopsy of the breast skin which came back positive. 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 She had her port placed yesterday.  REVIEW OF SYSTEMS: She hated having to stop breast-feeding. Today is Rebekkha's  first birthday. Jamie breast-fed her son for 23 months. They just weaned [name] over the past week, without too much difficulty, but some regrets. Jamie tolerated port placement without complications. A detailed review of systems today is entirely negative.  PAST MEDICAL HISTORY: Past Medical History  Diagnosis Date  . Iron (Fe) deficiency anemia   . Asthma     exercise induced  . Breast cancer   . Back pain   . Migraines   . Lactose intolerance     PAST SURGICAL HISTORY: Past Surgical History  Procedure Date  . Appendectomy   . Shoulder surgery   . Knee surgery     X2  . Portacath placement 02/21/2012    Procedure: INSERTION PORT-A-CATH;  Surgeon: Almond Lint, MD;  Location: [facility];  Service: General;  Laterality: N/A;  . Skin biopsy 02/21/2012    Procedure: BIOPSY SKIN;  Surgeon: Almond Lint, MD;  Location: [facility];  Service: General;  Laterality: Right;    FAMILY HISTORY Family History  Problem Relation Age of Onset  . Cancer Paternal Aunt 31    Breast cancer (half sibling, related  through father)  . Spina bifida Paternal Uncle 0    died around 72-13 months of age  . Cancer Maternal Grandmother 66    colon cancer  . Dementia Maternal Grandfather   . Cancer Paternal Grandmother     breast cancer between 37-42  . Cancer Paternal Grandfather     brain cancer  . Cancer Other     Paternal grandmother's siblings had breast cancer and pancreatic cancer  .  Cancer Other 42    maternal great grandmother with breast cancer   the patient's parents are alive, in their early 29s. The patient is an only child. There is a significant family history of cancer including breast, brain, and colon, detailed in our genetic counselors report. BRCA and P. 53 testing is in progress.  GYNECOLOGIC HISTORY: Menarche age 45; she is GX P2, first live birth age 38. Most recent period was within the last month. She has a paraguard IUD in place. She has not had periods for a long time since she has been breast-feeding.  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 works as a Furniture conservator/restorer for [employer]. Their children are [name] (2008) and [name] (2012).   ADVANCED DIRECTIVES: not in place  HEALTH MAINTENANCE: History  Substance Use Topics  . Smoking status: Never Smoker   . Smokeless tobacco: Not on file  . Alcohol Use: 2.5 - 3.5 oz/week    5-7 drink(s) per week     OCCASIONAL     Colonoscopy: no  PAP: 2012  Bone density: no  Lipid panel:  Allergies  Allergen Reactions  . Antiseptic Products, Misc.   [name] (Antiseptic Products, Misc.) Rash  . Penicillins Rash    Current Outpatient Prescriptions  Medication Sig Dispense Refill  . dexamethasone (DECADRON) 4 MG tablet Take 8 mg by mouth 2 (two) times daily with a meal.      . LORazepam (ATIVAN) 0.5 MG tablet Take 1 tablet (0.5 mg total) by mouth at bedtime as needed for anxiety.  30 tablet  0  . Probiotic Product (PROBIOTIC & ACIDOPHILUS EX ST) CAPS Take 1 capsule by mouth daily.        Marland Kitchen oxyCODONE-acetaminophen (ROXICET) 5-325 MG per tablet Take 1-2 tablets by mouth every 4 (four) hours as needed for pain.  30 tablet  0  . prochlorperazine (COMPAZINE) 10 MG tablet Take 1 tablet (10 mg total) by mouth every 6 (six) hours as needed.  30 tablet  2    OBJECTIVE: Young white woman who appears well Filed Vitals:    02/29/12 0945  BP: 120/81  Pulse: 88  Temp: 98.7 F (37.1 C)     Body mass index is 27.05 kg/(m^2).    ECOG FS: 0  Sclerae unicteric Oropharynx clear No cervical or supraclavicular adenopathy Lungs no rales or rhonchi Heart regular rate and rhythm Abd soft, nontender, positive bowel sounds, no organomegaly MSK no focal spinal tenderness, no peripheral edema Neuro: nonfocal Breasts: The mass in the right breast is most easily palpable in the superior aspect, towards the center of the breast, about 7 or 8 cm above the nipple. Other masses are harder to feel. I do not palpate any axillary adenopathy. The left breast is unremarkable. The port is eazy to palpate.  LAB RESULTS: Lab Results  Component Value Date   WBC 7.4 02/16/2012   NEUTROABS 4.3 02/16/2012   HGB 13.3 02/16/2012   HCT 39.6  02/16/2012   MCV 92.4 02/16/2012   PLT 307 02/16/2012      Chemistry      Component Value Date/Time   NA 138 02/16/2012 1226   K 3.4* 02/16/2012 1226   CL 102 02/16/2012 1226   CO2 24 02/16/2012 1226   BUN 13 02/16/2012 1226   CREATININE 0.75 02/16/2012 1226      Component Value Date/Time   CALCIUM 9.0 02/16/2012 1226   ALKPHOS 98 02/16/2012 1226   AST 19 02/16/2012 1226   ALT 12 02/16/2012 1226   BILITOT 0.2* 02/16/2012 1226       Lab Results  Component Value Date   LABCA2 28 02/16/2012    No components found with this basename: [id number]    No results found for this basename: INR:1;PROTIME:1 in the last 168 hours  Urinalysis    Component Value Date/Time   COLORURINE YELLOW 08/27/2010 1126   APPEARANCEUR CLEAR 08/27/2010 1126   LABSPEC 1.025 08/27/2010 1126   PHURINE 7.0 08/27/2010 1126   GLUCOSEU NEGATIVE 07/23/2010 1335   HGBUR NEGATIVE 07/23/2010 1335   BILIRUBINUR SMALL* 08/27/2010 1126   KETONESUR NEGATIVE 08/27/2010 1126   PROTEINUR NEGATIVE 08/27/2010 1126   UROBILINOGEN 0.2 08/27/2010 1126   NITRITE NEGATIVE 08/27/2010 1126   LEUKOCYTESUR TRACE* 08/27/2010 1126    STUDIES: Ct Chest W  Contrast  02/28/2012  *RADIOLOGY REPORT*  Clinical Data: Breast cancer.  CT CHEST WITH CONTRAST  Technique:  Multidetector CT imaging of the chest was performed following the standard protocol during bolus administration of intravenous contrast.  Contrast: 80mL OMNIPAQUE IOHEXOL 300 MG/ML  SOLN  Comparison: PET CT 02/28/2012 and MR breasts 02/15/2012.  Findings: Soft tissue in the prevascular space appears to have fatty stippling, suggesting thymus.  No pathologically enlarged mediastinal, hilar or internal mammary lymph nodes.  Right axillary lymph nodes measure up to 1.2 cm.  There are areas of irregular soft tissue in the right breast with overlying skin thickening.  No left axillary adenopathy.  Heart size normal.  No pericardial effusion.  Lungs are clear.  No pleural fluid.  Airway is unremarkable.  Incidental imaging of the upper abdomen shows no acute findings. No worrisome lytic or sclerotic lesions.  IMPRESSION:  1.  Areas of irregular soft tissue in the right breast with overlying skin thickening and right axillary adenopathy, consistent with the given history of breast cancer. 2.  No evidence of intrathoracic metastatic disease. 3.  PET CT is reported separately.  Original Report Authenticated By: Reyes Ivan, M.D.   Mr Breast Bilateral W Wo Contrast  02/15/2012  *RADIOLOGY REPORT*  Clinical Data: Recently diagnosed invasive ductal carcinoma within the right breast at the 10 o'clock and 11 o'clock positions. Preoperative evaluation.  BILATERAL BREAST MRI WITH AND WITHOUT CONTRAST  Technique: Multiplanar, multisequence MR images of both breasts were obtained prior to and following the intravenous administration of 15ml of Multihance.  Three dimensional images were evaluated at the independent DynaCad workstation.  Comparison:  02/10/2012, 02/07/2012, 01/15/2009 studies from [place] imaging.  Findings: There is marked diffuse background parenchymal enhancement present bilaterally and this limits  sensitivity. Within the right breast located at the 10 o'clock position corresponding to the recently biopsied invasive ductal carcinoma (via ultrasound guidance)  is an area of distortion with enhancement with central clip artifact. This measures 3.1 x 1.2 x 2.2 cm in size and is associated with mixed enhancement characteristics.  Located within the right breast at the 11 o'clock position and associated with central clip  artifact (the second area of known invasive ductal carcinoma ) is a 3.1 x 1.2 x 2.2 cm size irregular enhancing mass with mixed enhancement characteristics. There is marked diffuse background enhancement throughout the remainder of the right breast and also throughout the left breast which lowers the sensitivity of MRI in this patient.  There is no focal worrisome area of enhancement seen within the left breast.  There is diffuse skin thickening associated with the right breast. In addition, there are two enhancing, abnormal appearing lower right axillary lymph nodes located lateral to the pectoralis muscle measuring 2.0 and 1.8 cm in size.  These are worrisome for metastatic lymph nodes.  There is no evidence for internal mammary adenopathy or left axillary adenopathy and there are no additional findings.  IMPRESSION:  1.  Irregular enhancing mass with distortion located within the right breast at the 10 o'clock position corresponding to the recently biopsied invasive ductal carcinoma measuring 3.1 x 1.2 x 2.2 cm in size. 2.  Irregular enhancing mass located within the right breast at the 11 o'clock position corresponding to the recently biopsied invasive ductal carcinoma measuring 2.9 x 3.5 x 2.3 cm in size. 3.  Two adjacent abnormal appearing right lower axillary lymph nodes located lateral to the  pectoralis muscles worrisome for metastatic lymph nodes.  4.  Marked diffuse bilateral background parenchymal enhancement which lowers MR sensitivity.  RECOMMENDATION: Treatment planning.   THREE-DIMENSIONAL MR IMAGE RENDERING ON INDEPENDENT WORKSTATION:  Three-dimensional MR images were rendered by post-processing of the original MR data on an independent workstation.  The three- dimensional MR images were interpreted, and findings were reported in the accompanying complete MRI report for this study.  BI-RADS CATEGORY 6:  Known biopsy-proven malignancy - appropriate action should be taken.  Original Report Authenticated By: Rolla Plate, M.D.   Nm Pet Image Initial (pi) Skull Base To Thigh  02/28/2012  *RADIOLOGY REPORT*  Clinical Data: Initial treatment strategy for breast cancer.  NUCLEAR MEDICINE PET SKULL BASE TO THIGH  Fasting Blood Glucose:  85  Technique:  13.0 mCi F-18 FDG was injected intravenously. CT data was obtained and used for attenuation correction and anatomic localization only.  (This was not acquired as a diagnostic CT examination.) Additional exam technical data entered on technologist worksheet.  Comparison:  CT chest 02/28/2012 and breast MR 02/15/2012.  Findings:  Neck: No hypermetabolic lymph nodes in the neck. CT images show no acute findings.  Chest:  There are several foci of hypermetabolism in the right breast.  Index lesion has an S U V max of 8.8.  Right axillary lymph nodes are hypermetabolic as well, with an S U V max of 6.3. No additional areas of abnormal hypermetabolism in the chest. Interpretation of CT images of the chest will be deferred to diagnostic examination performed the same day.  Abdomen/Pelvis:  No foci of hypermetabolism in the abdomen or pelvis.  CT images show no acute findings.  Intrauterine contraceptive device is in place.  No free fluid.  [place]:  No focal hypermetabolic activity to suggest skeletal metastasis.  IMPRESSION: Multi focal hypermetabolism in the right breast with hypermetabolic right axillary adenopathy, consistent with the given history of breast cancer.  Original Report Authenticated By: Reyes Ivan, M.D.   Dg Chest  Port 1 View  02/21/2012  *RADIOLOGY REPORT*  Clinical Data: Port-A-Cath placement  PORTABLE CHEST - 1 VIEW  Comparison: None.  Findings: Borderline cardiomegaly noted.  No acute infiltrate or pulmonary edema.  There is a left  subclavian Port-A-Cath with tip in SVC right atrium junction.  No diagnostic pneumothorax.  IMPRESSION: No active disease.  Left subclavian Port-A-Cath in place.  No diagnostic pneumothorax.  Original Report Authenticated By: Natasha Mead, M.D.   Dg C-arm 1-60 Min-no Report  02/21/2012  CLINICAL DATA: portacath   C-ARM 1-60 MINUTES  Fluoroscopy was utilized by the requesting physician.  No radiographic  interpretation.     Echo July 3 showed an EF of 60%  ASSESSMENT: 36 y.o. [place] woman status post right breast biopsy 02/09/2012 for multifocal pT4 cN1, stage IIIB invasive ductal carcinoma. Prognostic profile from one of the 2 masses shows it to be triple positive with an MIB-1 of 30%. BRCA testing is pending.  PLAN: Asher Muir is ready to start chemotherapy, which will consist of doxorubicin and cyclophosphamide given in dose dense fashion x4, to be followed by paclitaxel in dose dense fashion x4. Today we went over how to take the dexamethasone, prochlorperazine, and lorazepam medications, and how to use the EMLA cream appropriately. She has all this information in writing. I encouraged her to call us with any problems that may develop. She is now ready to start chemotherapy tomorrow.  MAGRINAT,GUSTAV C    02/29/2012

## 2012-03-01 ENCOUNTER — Ambulatory Visit (HOSPITAL_BASED_OUTPATIENT_CLINIC_OR_DEPARTMENT_OTHER): Payer: Managed Care, Other (non HMO)

## 2012-03-01 VITALS — BP 119/79 | HR 89 | Temp 97.5°F

## 2012-03-01 DIAGNOSIS — Z5111 Encounter for antineoplastic chemotherapy: Secondary | ICD-10-CM

## 2012-03-01 DIAGNOSIS — Z170 Estrogen receptor positive status [ER+]: Secondary | ICD-10-CM

## 2012-03-01 DIAGNOSIS — C50919 Malignant neoplasm of unspecified site of unspecified female breast: Secondary | ICD-10-CM

## 2012-03-01 DIAGNOSIS — C50419 Malignant neoplasm of upper-outer quadrant of unspecified female breast: Secondary | ICD-10-CM

## 2012-03-01 MED ORDER — PALONOSETRON HCL INJECTION 0.25 MG/5ML
0.2500 mg | Freq: Once | INTRAVENOUS | Status: AC
  Administered 2012-03-01: 0.25 mg via INTRAVENOUS

## 2012-03-01 MED ORDER — SODIUM CHLORIDE 0.9 % IV SOLN
150.0000 mg | Freq: Once | INTRAVENOUS | Status: AC
  Administered 2012-03-01: 150 mg via INTRAVENOUS
  Filled 2012-03-01: qty 5

## 2012-03-01 MED ORDER — DOXORUBICIN HCL CHEMO IV INJECTION 2 MG/ML
60.0000 mg/m2 | Freq: Once | INTRAVENOUS | Status: AC
  Administered 2012-03-01: 114 mg via INTRAVENOUS
  Filled 2012-03-01: qty 57

## 2012-03-01 MED ORDER — DEXAMETHASONE SODIUM PHOSPHATE 4 MG/ML IJ SOLN
12.0000 mg | Freq: Once | INTRAMUSCULAR | Status: AC
  Administered 2012-03-01: 12 mg via INTRAVENOUS

## 2012-03-01 MED ORDER — SODIUM CHLORIDE 0.9 % IV SOLN
Freq: Once | INTRAVENOUS | Status: AC
  Administered 2012-03-01: 10:00:00 via INTRAVENOUS

## 2012-03-01 MED ORDER — SODIUM CHLORIDE 0.9 % IV SOLN
600.0000 mg/m2 | Freq: Once | INTRAVENOUS | Status: AC
  Administered 2012-03-01: 1140 mg via INTRAVENOUS
  Filled 2012-03-01: qty 57

## 2012-03-02 ENCOUNTER — Ambulatory Visit (HOSPITAL_BASED_OUTPATIENT_CLINIC_OR_DEPARTMENT_OTHER): Payer: Managed Care, Other (non HMO)

## 2012-03-02 ENCOUNTER — Other Ambulatory Visit: Payer: Self-pay | Admitting: *Deleted

## 2012-03-02 ENCOUNTER — Telehealth: Payer: Self-pay | Admitting: *Deleted

## 2012-03-02 VITALS — BP 130/86 | HR 80 | Temp 96.8°F

## 2012-03-02 DIAGNOSIS — C50419 Malignant neoplasm of upper-outer quadrant of unspecified female breast: Secondary | ICD-10-CM

## 2012-03-02 DIAGNOSIS — Z5189 Encounter for other specified aftercare: Secondary | ICD-10-CM

## 2012-03-02 DIAGNOSIS — C50919 Malignant neoplasm of unspecified site of unspecified female breast: Secondary | ICD-10-CM

## 2012-03-02 MED ORDER — PEGFILGRASTIM INJECTION 6 MG/0.6ML
6.0000 mg | Freq: Once | SUBCUTANEOUS | Status: AC
  Administered 2012-03-02: 6 mg via SUBCUTANEOUS
  Filled 2012-03-02: qty 0.6

## 2012-03-02 MED ORDER — SUMATRIPTAN SUCCINATE 50 MG PO TABS: 50.0000 mg | ORAL_TABLET | ORAL | Status: DC

## 2012-03-02 NOTE — Telephone Encounter (Signed)
Patient here for Neulasta injection following 1st A/C chemo treatment.  She is C/O migraine headache which she has had in the past.  She feels that it is a reaction to Aloxi (she is allergic to Zofran)   She has been eating and drinking, but is having nausea which she relates to her migraines.  Also has not had much sleep because of having to care for her children.  She has use Imitrex in the past and it works for her headaches. I have notified the nurse who will talk to Dr Darnelle Catalan and call pt back.

## 2012-03-03 ENCOUNTER — Telehealth: Payer: Self-pay | Admitting: *Deleted

## 2012-03-03 NOTE — Telephone Encounter (Signed)
Pt called to state noted blurred vision today interfering with activities- Asher Muir is inquiring if " there anything I can take to subside this?"  Per discussion Marquisa is taking Claritin. Of note migraine reported yesterday has subsided but pt is concerned blurred vision might initiate another onset.  This RN discussed side effects described are more related to premeds for chemo - besides use of Claritin- medications are not beneficial. Pt is using eyeglasses she wore while pregnant and is avoiding particular activities.  This note will be given to MD for review for appropriate assessment and if needed changes in premedications.  No other needs at this time.

## 2012-03-07 ENCOUNTER — Other Ambulatory Visit: Payer: Self-pay | Admitting: *Deleted

## 2012-03-07 MED ORDER — PROMETHAZINE HCL 25 MG PO TABS: 25.0000 mg | ORAL_TABLET | Freq: Four times a day (QID) | ORAL | Status: DC | PRN

## 2012-03-08 ENCOUNTER — Other Ambulatory Visit: Payer: Self-pay | Admitting: Physician Assistant

## 2012-03-08 ENCOUNTER — Encounter: Payer: Self-pay | Admitting: Physician Assistant

## 2012-03-08 ENCOUNTER — Other Ambulatory Visit (HOSPITAL_BASED_OUTPATIENT_CLINIC_OR_DEPARTMENT_OTHER): Payer: Managed Care, Other (non HMO) | Admitting: Lab

## 2012-03-08 ENCOUNTER — Ambulatory Visit (HOSPITAL_BASED_OUTPATIENT_CLINIC_OR_DEPARTMENT_OTHER): Payer: Managed Care, Other (non HMO) | Admitting: Physician Assistant

## 2012-03-08 VITALS — BP 126/83 | HR 90 | Temp 98.2°F | Ht 67.0 in | Wt 169.9 lb

## 2012-03-08 DIAGNOSIS — C50419 Malignant neoplasm of upper-outer quadrant of unspecified female breast: Secondary | ICD-10-CM

## 2012-03-08 DIAGNOSIS — K219 Gastro-esophageal reflux disease without esophagitis: Secondary | ICD-10-CM

## 2012-03-08 LAB — CBC WITH DIFFERENTIAL/PLATELET
BASO%: 0.6 % (ref 0.0–2.0)
EOS%: 3.3 % (ref 0.0–7.0)
HCT: 38.6 % (ref 34.8–46.6)
LYMPH%: 25.5 % (ref 14.0–49.7)
MCH: 31 pg (ref 25.1–34.0)
MCHC: 34 g/dL (ref 31.5–36.0)
MONO#: 0.3 10*3/uL (ref 0.1–0.9)
MONO%: 4.5 % (ref 0.0–14.0)
NEUT%: 66.1 % (ref 38.4–76.8)
Platelets: 270 10*3/uL (ref 145–400)
RBC: 4.23 10*6/uL (ref 3.70–5.45)
WBC: 5.8 10*3/uL (ref 3.9–10.3)

## 2012-03-08 MED ORDER — METOCLOPRAMIDE HCL 10 MG PO TABS: 10.0000 mg | ORAL_TABLET | Freq: Four times a day (QID) | ORAL | Status: DC | PRN

## 2012-03-08 MED ORDER — OMEPRAZOLE 20 MG PO CPDR: 20.0000 mg | DELAYED_RELEASE_CAPSULE | Freq: Every day | ORAL | Status: DC

## 2012-03-08 NOTE — Progress Notes (Signed)
ID: Jody Dunn   DOB: 01-07-1976  MR#: [id number]  CSN#:[id number]  HISTORY OF PRESENT ILLNESS: Violet had a mammogram at [facility] of 2010 because of shooting pains in the right breast. This found no worrisome finding. More recently, in the spring of 2012, the patient at delivered her second child. She nursed but the child refused to take milk from the right breast. The patient tells me that she has been able to palpate easily from the right breast, so production is not an issue. More recently, early June 2013, the patient felt a new lump in the right breast, and brought this to the attention of [name] at [facility]. The patient had repeat mammography at [facility] 02/07/2012. This found at the breast tissue to be heterogeneously dense, which is not a surprise given that the patient is lactating. There was diffuse skin thickening. Erythema is not described. The nipple was retracted. There was no definitive peau d'orange appearance. Pleomorphic calcifications in the lateral right breast extended approximately 11 cm, without definite mass. On ultrasound there was a vague irregular hypoechoic mass measuring approximately 15 mm in the right breast, with a second ill-defined hypoechoic lobulated mass measuring approximately 17 mm. Both these masses were biopsied 02/09/2012, and the final pathology ([id number]) showed both IIb invasive ductal carcinoma, with a prognostic panel (from the mass at 11:00) as follows: estrogen receptor positive at 90%, progesterone receptor positive at 90%, Mib-1 of 30%, and HER-2 amplification with a ratio of 4.29 by CISH. MRI of the breast was performed 02/15/2012 and showed the mass in the 10:00 position to measure 3.1 cm, the one at the 11:00 position to measure 3.1 cm. There was diffuse skin thickening in the right breast and 2 enhancing abnormal-appearing lower right axillary lymph nodes measuring 2.0 and 1.8 cm respectively. There was no evidence for internal mammary  adenopathy, left axillary adenopathy, or significant findings in the left breast.  INTERVAL HISTORY: [name] returns today with her daughter Marcelle Smiling and her mother Rosey Bath for assessment of chemotoxicity on day 8 cycle 1 of 4 planned dose dense cycles of doxorubicin/cyclophosphamide being given in the neoadjuvant setting for her right breast carcinoma. It has not been an easy week for her, and she had many concerns to discuss today. Specifically she has had headaches, fatigue, and lingering nausea with emesis. She felt lightheaded and dizzy which she believes was associated with the Compazine. (These feelings continued, even after she had discontinued the steroids.) She cannot take Zofran due to severe headaches, and this has been noted in her list of allergies/intolerances today. She called and requested a prescription for Phenergan last week which she has had great success with in the past when treating nausea.  REVIEW OF SYSTEMS: Ellagrace denies any fevers or chills. She's had no rashes or skin changes. Her hair is beginning to thin. She's had no signs of abnormal bleeding. She has developed some reflux. She's had no change in bowel habits. No cough or increased shortness of breath, and no chest pain. No current dizziness. No change in vision. No unusual myalgias or arthralgias. No peripheral swelling.  A detailed review of systems is otherwise noncontributory.   PAST MEDICAL HISTORY: Past Medical History  Diagnosis Date  . Iron (Fe) deficiency anemia   . Asthma     exercise induced  . Breast cancer   . Back pain   . Migraines   . Lactose intolerance     PAST SURGICAL HISTORY: Past Surgical History  Procedure  Date  . Appendectomy   . Shoulder surgery   . Knee surgery     X2  . Portacath placement 02/21/2012    Procedure: INSERTION PORT-A-CATH;  Surgeon: Almond Lint, MD;  Location: [facility];  Service: General;  Laterality: N/A;  . Skin biopsy 02/21/2012    Procedure: BIOPSY SKIN;  Surgeon:  Almond Lint, MD;  Location: [facility];  Service: General;  Laterality: Right;    FAMILY HISTORY Family History  Problem Relation Age of Onset  . Cancer Paternal Aunt 42    Breast cancer (half sibling, related through father)  . Spina bifida Paternal Uncle 0    died around 66-81 months of age  . Cancer Maternal Grandmother 1    colon cancer  . Dementia Maternal Grandfather   . Cancer Paternal Grandmother     breast cancer between 25-42  . Cancer Paternal Grandfather     brain cancer  . Cancer Other     Paternal grandmother's siblings had breast cancer and pancreatic cancer  . Cancer Other 76    maternal great grandmother with breast cancer   the patient's parents are alive, in their early 47s. The patient is an only child. There is a significant family history of cancer including breast, brain, and colon, detailed in our genetic counselors report. BRCA and P. 53 testing is in progress.  GYNECOLOGIC HISTORY: Menarche age 17; she is GX P2, first live birth age 44. Most recent period was within the last month. She has a paraguard IUD in place. She has not had periods for a long time since she has been breast-feeding.  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 works as a Furniture conservator/restorer for [employer]. Their children are [name] (2008) and [name] (2012).   ADVANCED DIRECTIVES: not in place  HEALTH MAINTENANCE: History  Substance Use Topics  . Smoking status: Never Smoker   . Smokeless tobacco: Not on file  . Alcohol Use: 2.5 - 3.5 oz/week    5-7 drink(s) per week     OCCASIONAL     Colonoscopy: no  PAP: 2012  Bone density: no  Lipid panel:  Allergies  Allergen Reactions  . Zofran (Ondansetron Hcl) Other (See Comments)    Migraine headaches  . Antiseptic Products, Misc.   [name] (Antiseptic Products, Misc.) Rash  . Penicillins Rash    Current Outpatient Prescriptions  Medication Sig  Dispense Refill  . dexamethasone (DECADRON) 4 MG tablet Take 8 mg by mouth 2 (two) times daily with a meal.      . lidocaine-prilocaine (EMLA) cream       . LORazepam (ATIVAN) 0.5 MG tablet       . metoCLOPramide (REGLAN) 10 MG tablet Take 1 tablet (10 mg total) by mouth 4 (four) times daily as needed.  30 tablet  3  . omeprazole (PRILOSEC) 20 MG capsule Take 1 capsule (20 mg total) by mouth daily.  60 capsule  4  . Probiotic Product (PROBIOTIC & ACIDOPHILUS EX ST) CAPS Take 1 capsule by mouth daily.        . prochlorperazine (COMPAZINE) 10 MG tablet Take 1 tablet (10 mg total) by mouth every 6 (six) hours as needed.  30 tablet  2  . promethazine (PHENERGAN) 25 MG tablet Take 1 tablet (25 mg total) by mouth every 6 (six) hours as needed for nausea.  60 tablet  2  . SUMAtriptan (IMITREX) 50 MG tablet  Take 1 tablet (50 mg total) by mouth as directed.  10 tablet  6    OBJECTIVE: Young white woman who appears comfortable and in no acute distress. Filed Vitals:   03/08/12 0930  BP: 126/83  Pulse: 90  Temp: 98.2 F (36.8 C)     Body mass index is 26.61 kg/(m^2).    ECOG FS: 1 Filed Weights   03/08/12 0930  Weight: 169 lb 14.4 oz (77.066 kg)   Physical Exam: HEENT:  Sclerae anicteric, conjunctivae pink.  Oropharynx clear.  No mucositis or candidiasis.   Nodes:  No cervical, supraclavicular, or axillary lymphadenopathy palpated.  Breast Exam:  Deferred   Lungs:  Clear to auscultation bilaterally.  No crackles, rhonchi, or wheezes.   Heart:  Regular rate and rhythm.   Abdomen:  Soft, nontender.  Positive bowel sounds.  No organomegaly or masses palpated.   Musculoskeletal:  No focal spinal tenderness to palpation.  Extremities:  Benign.  No peripheral edema or cyanosis.   Skin:  Benign.   Neuro:  Nonfocal.  Alert and oriented x3.    LAB RESULTS: Lab Results  Component Value Date   WBC 5.8 03/08/2012   NEUTROABS 3.8 03/08/2012   HGB 13.1 03/08/2012   HCT 38.6 03/08/2012   MCV 91.2  03/08/2012   PLT 270 03/08/2012      Chemistry      Component Value Date/Time   NA 138 02/16/2012 1226   K 3.4* 02/16/2012 1226   CL 102 02/16/2012 1226   CO2 24 02/16/2012 1226   BUN 13 02/16/2012 1226   CREATININE 0.75 02/16/2012 1226      Component Value Date/Time   CALCIUM 9.0 02/16/2012 1226   ALKPHOS 98 02/16/2012 1226   AST 19 02/16/2012 1226   ALT 12 02/16/2012 1226   BILITOT 0.2* 02/16/2012 1226       Lab Results  Component Value Date   LABCA2 28 02/16/2012     STUDIES:  Echo July 3 showed an EF of 60%   ASSESSMENT: 36 y.o. [place] woman   (1)  status post right breast biopsy 02/09/2012 for multifocal pT4 cN1, stage IIIB invasive ductal carcinoma. Prognostic profile from one of the 2 masses shows it to be triple positive with an MIB-1 of 30%.   (2)  being treated in the neoadjuvant setting, the goal being to complete 4 dose dense cycles of doxorubicin/Adriamycin, followed by 12 dose dense cycles of paclitaxel given along with trastuzumab. The trastuzumab will be continued for total of one year.  (3)  BRCA testing is pending.  PLAN: Over half of our one-hour appointment today was spent reviewing  Enriqueta's concerns, adjusting her antinausea regimen, discussing the upcoming treatments, and coordinating care. I have prescribed omeprazole, 20 mg by mouth twice a day for reflux. She will no longer take the Compazine, and will replace it to with either Phenergan or metoclopramide which I have prescribed today. She might find that the metoclopramide works very well, without causing this sleepiness associated with Phenergan.  She scheduled to return next week on July 24 anticipation of her second cycle of chemotherapy. We will review her antinausea regimen again that day.   Erby Sanderson    03/08/2012

## 2012-03-10 ENCOUNTER — Encounter: Payer: Self-pay | Admitting: Genetic Counselor

## 2012-03-10 ENCOUNTER — Telehealth: Payer: Self-pay | Admitting: Genetic Counselor

## 2012-03-10 NOTE — Telephone Encounter (Signed)
Revealed negative results on BreastPlus panel (BRCA1/2, CDH1, STK11, PTEN and TP53).  Discussed proceeding with BreastNext panel, but patient declined.

## 2012-03-13 ENCOUNTER — Encounter (INDEPENDENT_AMBULATORY_CARE_PROVIDER_SITE_OTHER): Payer: Self-pay | Admitting: General Surgery

## 2012-03-13 ENCOUNTER — Ambulatory Visit (INDEPENDENT_AMBULATORY_CARE_PROVIDER_SITE_OTHER): Payer: Managed Care, Other (non HMO) | Admitting: General Surgery

## 2012-03-13 ENCOUNTER — Encounter: Payer: Self-pay | Admitting: Oncology

## 2012-03-13 ENCOUNTER — Encounter: Payer: Self-pay | Admitting: *Deleted

## 2012-03-13 VITALS — BP 118/90 | HR 82 | Temp 97.6°F | Ht 68.0 in | Wt 167.6 lb

## 2012-03-13 DIAGNOSIS — C50419 Malignant neoplasm of upper-outer quadrant of unspecified female breast: Secondary | ICD-10-CM

## 2012-03-13 NOTE — Progress Notes (Signed)
Called [phone] they approved omeprazole 20mg  60 tabs from 03/13/12-10/21/13.

## 2012-03-13 NOTE — Progress Notes (Signed)
RECEIVED A FAX FROM [facility] CONCERNING A PRIOR AUTHORIZATION FOR OMEPRAZOLE. THIS REQUEST WAS PLACED IN THE MANAGED CARE BIN.

## 2012-03-13 NOTE — Patient Instructions (Signed)
Come back to see me around 2 weeks prior to completion of chemotherapy.  See Dr. Odis Luster  Discuss with him the concept of left nipple sparing mastectomy.

## 2012-03-14 ENCOUNTER — Other Ambulatory Visit: Payer: Self-pay | Admitting: Physician Assistant

## 2012-03-15 ENCOUNTER — Other Ambulatory Visit: Payer: Self-pay | Admitting: Oncology

## 2012-03-15 ENCOUNTER — Ambulatory Visit (HOSPITAL_BASED_OUTPATIENT_CLINIC_OR_DEPARTMENT_OTHER): Payer: Managed Care, Other (non HMO) | Admitting: Physician Assistant

## 2012-03-15 ENCOUNTER — Encounter: Payer: Self-pay | Admitting: Physician Assistant

## 2012-03-15 ENCOUNTER — Other Ambulatory Visit (HOSPITAL_BASED_OUTPATIENT_CLINIC_OR_DEPARTMENT_OTHER): Payer: Managed Care, Other (non HMO) | Admitting: Lab

## 2012-03-15 ENCOUNTER — Ambulatory Visit (HOSPITAL_BASED_OUTPATIENT_CLINIC_OR_DEPARTMENT_OTHER): Payer: Managed Care, Other (non HMO)

## 2012-03-15 VITALS — BP 128/80 | HR 84 | Temp 98.3°F | Ht 68.0 in | Wt 168.6 lb

## 2012-03-15 DIAGNOSIS — C50919 Malignant neoplasm of unspecified site of unspecified female breast: Secondary | ICD-10-CM

## 2012-03-15 DIAGNOSIS — C50419 Malignant neoplasm of upper-outer quadrant of unspecified female breast: Secondary | ICD-10-CM

## 2012-03-15 DIAGNOSIS — Z170 Estrogen receptor positive status [ER+]: Secondary | ICD-10-CM

## 2012-03-15 DIAGNOSIS — Z5111 Encounter for antineoplastic chemotherapy: Secondary | ICD-10-CM

## 2012-03-15 LAB — CBC WITH DIFFERENTIAL/PLATELET
BASO%: 0.5 % (ref 0.0–2.0)
EOS%: 0.4 % (ref 0.0–7.0)
HCT: 37.3 % (ref 34.8–46.6)
MCH: 30.5 pg (ref 25.1–34.0)
MCHC: 34.6 g/dL (ref 31.5–36.0)
MONO#: 0.5 10*3/uL (ref 0.1–0.9)
RBC: 4.23 10*6/uL (ref 3.70–5.45)
RDW: 12.8 % (ref 11.2–14.5)
WBC: 11.6 10*3/uL — ABNORMAL HIGH (ref 3.9–10.3)
lymph#: 2.8 10*3/uL (ref 0.9–3.3)
nRBC: 0 % (ref 0–0)

## 2012-03-15 LAB — COMPREHENSIVE METABOLIC PANEL
ALT: 15 U/L (ref 0–35)
AST: 18 U/L (ref 0–37)
Albumin: 3.8 g/dL (ref 3.5–5.2)
Alkaline Phosphatase: 117 U/L (ref 39–117)
BUN: 16 mg/dL (ref 6–23)
Calcium: 8.9 mg/dL (ref 8.4–10.5)
Chloride: 101 mEq/L (ref 96–112)
Potassium: 3.9 mEq/L (ref 3.5–5.3)
Sodium: 135 mEq/L (ref 135–145)

## 2012-03-15 MED ORDER — SODIUM CHLORIDE 0.9 % IV SOLN
Freq: Once | INTRAVENOUS | Status: AC
  Administered 2012-03-15: 11:00:00 via INTRAVENOUS

## 2012-03-15 MED ORDER — DEXAMETHASONE SODIUM PHOSPHATE 4 MG/ML IJ SOLN
12.0000 mg | Freq: Once | INTRAMUSCULAR | Status: AC
  Administered 2012-03-15: 12 mg via INTRAVENOUS

## 2012-03-15 MED ORDER — PALONOSETRON HCL INJECTION 0.25 MG/5ML
0.2500 mg | Freq: Once | INTRAVENOUS | Status: AC
  Administered 2012-03-15: 0.25 mg via INTRAVENOUS

## 2012-03-15 MED ORDER — HEPARIN SOD (PORK) LOCK FLUSH 100 UNIT/ML IV SOLN
500.0000 [IU] | Freq: Once | INTRAVENOUS | Status: AC | PRN
  Administered 2012-03-15: 500 [IU]
  Filled 2012-03-15: qty 5

## 2012-03-15 MED ORDER — SODIUM CHLORIDE 0.9 % IJ SOLN
10.0000 mL | INTRAMUSCULAR | Status: DC | PRN
  Administered 2012-03-15: 10 mL
  Filled 2012-03-15: qty 10

## 2012-03-15 MED ORDER — SODIUM CHLORIDE 0.9 % IV SOLN
600.0000 mg/m2 | Freq: Once | INTRAVENOUS | Status: AC
  Administered 2012-03-15: 1140 mg via INTRAVENOUS
  Filled 2012-03-15: qty 57

## 2012-03-15 MED ORDER — DOXORUBICIN HCL CHEMO IV INJECTION 2 MG/ML
60.0000 mg/m2 | Freq: Once | INTRAVENOUS | Status: AC
  Administered 2012-03-15: 114 mg via INTRAVENOUS
  Filled 2012-03-15: qty 57

## 2012-03-15 MED ORDER — SODIUM CHLORIDE 0.9 % IV SOLN
150.0000 mg | Freq: Once | INTRAVENOUS | Status: AC
  Administered 2012-03-15: 150 mg via INTRAVENOUS
  Filled 2012-03-15: qty 5

## 2012-03-15 NOTE — Progress Notes (Signed)
ID: Jody Dunn   DOB: 12/24/1975  MR#: [id number]  WUJ#:[id number]  HISTORY OF PRESENT ILLNESS: Quetzal had a mammogram at [facility] of 2010 because of shooting pains in the right breast. This found no worrisome finding. More recently, in the spring of 2012, the patient at delivered her second child. She nursed but the child refused to take milk from the right breast. The patient tells me that she has been able to palpate easily from the right breast, so production is not an issue. More recently, early June 2013, the patient felt a new lump in the right breast, and brought this to the attention of [name] at [facility]. The patient had repeat mammography at [facility] 02/07/2012. This found at the breast tissue to be heterogeneously dense, which is not a surprise given that the patient is lactating. There was diffuse skin thickening. Erythema is not described. The nipple was retracted. There was no definitive peau d'orange appearance. Pleomorphic calcifications in the lateral right breast extended approximately 11 cm, without definite mass. On ultrasound there was a vague irregular hypoechoic mass measuring approximately 15 mm in the right breast, with a second ill-defined hypoechoic lobulated mass measuring approximately 17 mm. Both these masses were biopsied 02/09/2012, and the final pathology ([id number]) showed both IIb invasive ductal carcinoma, with a prognostic panel (from the mass at 11:00) as follows: estrogen receptor positive at 90%, progesterone receptor positive at 90%, Mib-1 of 30%, and HER-2 amplification with a ratio of 4.29 by CISH. MRI of the breast was performed 02/15/2012 and showed the mass in the 10:00 position to measure 3.1 cm, the one at the 11:00 position to measure 3.1 cm. There was diffuse skin thickening in the right breast and 2 enhancing abnormal-appearing lower right axillary lymph nodes measuring 2.0 and 1.8 cm respectively. There was no evidence for internal mammary  adenopathy, left axillary adenopathy, or significant findings in the left breast.  INTERVAL HISTORY: Jody Dunn returns today with her husband for followup of her locally advanced right breast carcinoma. She is due for day 1 cycle 2 of 4 planned dose dense cycles of doxorubicin/cyclophosphamide being given in the neoadjuvant setting. She feels that she has finally recovered from cycle 1, with the exception of some continued reflux for which she is taking omeprazole, she has few complaints today.  REVIEW OF SYSTEMS: Xela denies any fevers or chills. She's had no rashes or skin changes. Her hair is beginning to thin. No mouth ulcers or oral sensitivity. She's had no signs of abnormal bleeding. She denies any additional nausea or emesis. She's had no change in bowel habits. No cough or increased shortness of breath, and no chest pain. No current dizziness. No abnormal headaches or change in vision. No unusual myalgias or arthralgias. No peripheral swelling.  A detailed review of systems is otherwise noncontributory.   PAST MEDICAL HISTORY: Past Medical History  Diagnosis Date  . Iron (Fe) deficiency anemia   . Asthma     exercise induced  . Breast cancer   . Back pain   . Migraines   . Lactose intolerance     PAST SURGICAL HISTORY: Past Surgical History  Procedure Date  . Appendectomy   . Shoulder surgery   . Knee surgery     X2  . Portacath placement 02/21/2012    Procedure: INSERTION PORT-A-CATH;  Surgeon: Almond Lint, MD;  Location: [facility];  Service: General;  Laterality: N/A;  . Skin biopsy 02/21/2012    Procedure: BIOPSY SKIN;  Surgeon: Almond Lint, MD;  Location: [facility];  Service: General;  Laterality: Right;    FAMILY HISTORY Family History  Problem Relation Age of Onset  . Cancer Paternal Aunt 68    Breast cancer (half sibling, related through father)  . Spina bifida Paternal Uncle 0    died around 46-19 months of age  . Cancer Maternal Grandmother 22    colon cancer  .  Dementia Maternal Grandfather   . Cancer Paternal Grandmother     breast cancer between 53-42  . Cancer Paternal Grandfather     brain cancer  . Cancer Other     Paternal grandmother's siblings had breast cancer and pancreatic cancer  . Cancer Other 8    maternal great grandmother with breast cancer   the patient's parents are alive, in their early 51s. The patient is an only child. There is a significant family history of cancer including breast, brain, and colon, detailed in our genetic counselors report. BRCA and P. 53 testing is in progress.  GYNECOLOGIC HISTORY: Menarche age 33; she is GX P2, first live birth age 69. Most recent period was within the last month. She has a paraguard IUD in place. She has not had periods for a long time since she has been breast-feeding.  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 works as a Furniture conservator/restorer for [employer]. Their children are [name] (2008) and [name] (2012).   ADVANCED DIRECTIVES: not in place  HEALTH MAINTENANCE: History  Substance Use Topics  . Smoking status: Never Smoker   . Smokeless tobacco: Not on file  . Alcohol Use: 2.5 - 3.5 oz/week    5-7 drink(s) per week     OCCASIONAL     Colonoscopy: no  PAP: 2012  Bone density: no  Lipid panel:  Allergies  Allergen Reactions  . Zofran (Ondansetron Hcl) Other (See Comments)    Migraine headaches  . Antiseptic Products, Misc.   [name] (Antiseptic Products, Misc.) Rash  . Penicillins Rash    Current Outpatient Prescriptions  Medication Sig Dispense Refill  . dexamethasone (DECADRON) 4 MG tablet Take 8 mg by mouth 2 (two) times daily with a meal.      . lidocaine-prilocaine (EMLA) cream       . LORazepam (ATIVAN) 0.5 MG tablet       . metoCLOPramide (REGLAN) 10 MG tablet Take 1 tablet (10 mg total) by mouth 4 (four) times daily as needed.  30 tablet  3  . omeprazole (PRILOSEC) 20 MG  capsule Take 1 capsule (20 mg total) by mouth daily.  60 capsule  4  . Probiotic Product (PROBIOTIC & ACIDOPHILUS EX ST) CAPS Take 1 capsule by mouth daily.        . promethazine (PHENERGAN) 25 MG tablet Take 25 mg by mouth every 6 (six) hours as needed.      . SUMAtriptan (IMITREX) 50 MG tablet Take 1 tablet (50 mg total) by mouth as directed.  10 tablet  6  . DISCONTD: promethazine (PHENERGAN) 25 MG tablet Take 1 tablet (25 mg total) by mouth every 6 (six) hours as needed for nausea.  60 tablet  2    OBJECTIVE: Young white woman who appears comfortable and in no acute distress. Filed Vitals:   03/15/12 0937  BP: 128/80  Pulse: 84  Temp: 98.3 F (36.8 C)     Body mass index is 25.64 kg/(m^2).  ECOG FS: 1 Filed Weights   03/15/12 0937  Weight: 168 lb 9.6 oz (76.476 kg)   Physical Exam: HEENT:  Sclerae anicteric.  Oropharynx clear.   Nodes:  No cervical, supraclavicular, or axillary lymphadenopathy palpated.  Breast Exam:   Right breast, palpable mass in the upper portion of the breast with diffuse skin thickening and slight nipple inversion. No nipple discharge. Left breast is benign no masses, skin changes, or nipple inversion. Port is intact in the left chest wall no erythema, edema, or evidence of infection. Lungs:  Clear to auscultation bilaterally.  No crackles, rhonchi, or wheezes.   Heart:  Regular rate and rhythm.   Abdomen:  Soft, nontender.  Positive bowel sounds.  No organomegaly or masses palpated.   Musculoskeletal:  No focal spinal tenderness to palpation.  Extremities:  Benign.  No peripheral edema or cyanosis.   Skin:  Benign.   Neuro:  Nonfocal.  Alert and oriented x3.    LAB RESULTS: Lab Results  Component Value Date   WBC 11.6* 03/15/2012   NEUTROABS 8.2* 03/15/2012   HGB 12.9 03/15/2012   HCT 37.3 03/15/2012   MCV 88.2 03/15/2012   PLT 213 03/15/2012      Chemistry      Component Value Date/Time   NA 138 02/16/2012 1226   K 3.4* 02/16/2012 1226   CL 102  02/16/2012 1226   CO2 24 02/16/2012 1226   BUN 13 02/16/2012 1226   CREATININE 0.75 02/16/2012 1226      Component Value Date/Time   CALCIUM 9.0 02/16/2012 1226   ALKPHOS 98 02/16/2012 1226   AST 19 02/16/2012 1226   ALT 12 02/16/2012 1226   BILITOT 0.2* 02/16/2012 1226       Lab Results  Component Value Date   LABCA2 28 02/16/2012     STUDIES:  Echo July 3 showed an EF of 60%   ASSESSMENT: 36 y.o. [place] woman   (1)  status post right breast biopsy 02/09/2012 for multifocal pT4 cN1, stage IIIB invasive ductal carcinoma. Prognostic profile from one of the 2 masses shows it to be triple positive with an MIB-1 of 30%.   (2)  being treated in the neoadjuvant setting, the goal being to complete 4 dose dense cycles of doxorubicin/Adriamycin, followed by 12 dose dense cycles of paclitaxel given along with trastuzumab. The trastuzumab will be continued for total of one year.  (3)  BRCA 1 and 2 negative  PLAN:  Dionisia will proceed to infusion today as planned for her second neoadjuvant dose of docetaxel/cyclophosphamide. She is discontinued her prochlorperazine, and has both metoclopramide and promethazine on hand at home to use for nausea. She'll also take her dexamethasone as before, and has lorazepam if needed.  She scheduled for Neulasta injection tomorrow. She will see Dr. Gala Romney for a cardiac consult next week on July 29, and I will see her on the 31st for assessment of chemotoxicity. Of note, Shalaine is also interested with meeting Dr. Kelly Splinter to discuss options for reconstruction. She will let us know if she needs to help coordinating that appointment.   Shacoya Burkhammer    03/15/2012

## 2012-03-15 NOTE — Patient Instructions (Signed)
[facility] Discharge Instructions for Patients Receiving Chemotherapy  Today you received the following chemotherapy agents Adriamycin and Cytoxan  To help prevent nausea and vomiting after your treatment, we encourage you to take your nausea medication as prescribed.     If you develop nausea and vomiting that is not controlled by your nausea medication, call the clinic. If it is after clinic hours your family physician or the after hours number for the clinic or go to the Emergency Department.   BELOW ARE SYMPTOMS THAT SHOULD BE REPORTED IMMEDIATELY:  *FEVER GREATER THAN 100.5 F  *CHILLS WITH OR WITHOUT FEVER  NAUSEA AND VOMITING THAT IS NOT CONTROLLED WITH YOUR NAUSEA MEDICATION  *UNUSUAL SHORTNESS OF BREATH  *UNUSUAL BRUISING OR BLEEDING  TENDERNESS IN MOUTH AND THROAT WITH OR WITHOUT PRESENCE OF ULCERS  *URINARY PROBLEMS  *BOWEL PROBLEMS  UNUSUAL RASH Items with * indicate a potential emergency and should be followed up as soon as possible.  One of the nurses will contact you 24 hours after your treatment. Please let the nurse know about any problems that you may have experienced. Feel free to call the clinic you have any questions or concerns. The clinic phone number is [phone].   I have been informed and understand all the instructions given to me. I know to contact the clinic, my physician, or go to the Emergency Department if any problems should occur. I do not have any questions at this time, but understand that I may call the clinic during office hours   should I have any questions or need assistance in obtaining follow up care.    __________________________________________  _____________  __________ Signature of Patient or Authorized Representative            Date                   Time    __________________________________________ Nurse's Signature    

## 2012-03-16 ENCOUNTER — Ambulatory Visit (HOSPITAL_BASED_OUTPATIENT_CLINIC_OR_DEPARTMENT_OTHER): Payer: Managed Care, Other (non HMO)

## 2012-03-16 VITALS — BP 115/75 | HR 84 | Temp 96.8°F

## 2012-03-16 DIAGNOSIS — C50419 Malignant neoplasm of upper-outer quadrant of unspecified female breast: Secondary | ICD-10-CM

## 2012-03-16 DIAGNOSIS — C50919 Malignant neoplasm of unspecified site of unspecified female breast: Secondary | ICD-10-CM

## 2012-03-16 MED ORDER — PEGFILGRASTIM INJECTION 6 MG/0.6ML
6.0000 mg | Freq: Once | SUBCUTANEOUS | Status: AC
  Administered 2012-03-16: 6 mg via SUBCUTANEOUS
  Filled 2012-03-16: qty 0.6

## 2012-03-16 NOTE — Progress Notes (Signed)
HISTORY: Pt is s/p left subclavian port a cath.  She has started chemotherapy.  She wants to discuss her further breast surgery and cancer plan.  She has cut and dyed her hair in order to have her children adjust before all the hair falls out. She is not having any pain.  She did have some nausea and fatigue with the first dose of chemotherapy.     PERTINENT REVIEW OF SYSTEMS: Otherwise negative.    EXAM: Head: Normocephalic and atraumatic.  Eyes:  Conjunctivae are normal. Pupils are equal, round, and reactive to light. No scleral icterus.  Neck:  Normal range of motion. Neck supple. No tracheal deviation present. No thyromegaly present.  Resp: No respiratory distress, normal effort. Chest wall:  Port a cath site is not infected.  Non tender.  Right breast still with skin dimpling.   Abd:  Abdomen is soft, non distended and non tender. No masses are palpable.  There is no rebound and no guarding.  Neurological: Alert and oriented to person, place, and time. Coordination normal.  Skin: Skin is warm and dry. No rash noted. No diaphoretic. No erythema. No pallor.  Psychiatric: Normal mood and affect. Normal behavior. Judgment and thought content normal.      ASSESSMENT AND PLAN:   Cancer of upper-outer quadrant of female breast, right breast.  [id number] Had discussion about future surgical plan. 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 After neoadjuvant chemotherapy, she will need R MRM and radiation.  Then will plan L simple mastectomy and bilateral reconstruction.  She may elect to have both mastectomies at the same time.  She is seeing Dr. Odis Luster next week to discuss types of reconstructions and what she wants.       20 minutes were spent in counseling.     Maudry Diego, MD Surgical Oncology, General & Endocrine Surgery [facility]  [place], Kenney Houseman, CNM Lelon Perla, DO

## 2012-03-16 NOTE — Assessment & Plan Note (Addendum)
Had discussion about future surgical plan. 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 After neoadjuvant chemotherapy, she will need R MRM and radiation.  Then will plan L simple mastectomy and bilateral reconstruction.  She may elect to have both mastectomies at the same time.  She is seeing Dr. Odis Luster next week to discuss types of reconstructions and what she wants.

## 2012-03-20 ENCOUNTER — Encounter (HOSPITAL_COMMUNITY): Payer: Self-pay

## 2012-03-20 ENCOUNTER — Ambulatory Visit (HOSPITAL_COMMUNITY)
Admission: RE | Admit: 2012-03-20 | Discharge: 2012-03-20 | Disposition: A | Discharge status: Home / Self Care | Payer: Managed Care, Other (non HMO) | Source: Ambulatory Visit | Attending: Internal Medicine | Admitting: Internal Medicine

## 2012-03-20 VITALS — BP 102/85 | HR 95 | Ht 68.0 in | Wt 172.8 lb

## 2012-03-20 DIAGNOSIS — C50419 Malignant neoplasm of upper-outer quadrant of unspecified female breast: Secondary | ICD-10-CM | POA: Insufficient documentation

## 2012-03-20 HISTORY — DX: Hyperlipidemia, unspecified: E78.5

## 2012-03-20 NOTE — Progress Notes (Signed)
Advanced Heart Failure Team Consult Note  Referring Physician: Dr. Darnelle Catalan  Reason for Consultation: Enrollment into cardio-oncology clinic  HPI:    Jody Dunn is a 36 y/o woman with recently diagnosed triple + R breast cancer (stage IIIb with skin and lymph node involvement) referred by Dr. Darnelle Catalan for enrollment into the cardio-oncology clinic.  In June 2013, the patient felt a new lump in the right breast, and brought this to the attention of [name] at [facility]. Found to have breast masses. Both these masses were biopsied 02/09/2012, and the final pathology ([id number]) showed both IIb invasive ductal carcinoma, with a prognostic panel (from the mass at 11:00) as follows: estrogen receptor positive at 90%, progesterone receptor positive at 90%, Mib-1 of 30%, and HER-2 amplification with a ratio of 4.29 by CISH. MRI of the breast was performed 02/15/2012 and showed the mass in the 10:00 position to measure 3.1 cm, the one at the 11:00 position to measure 3.1 cm. There was diffuse skin thickening in the right breast and 2 enhancing abnormal-appearing lower right axillary lymph nodes measuring 2.0 and 1.8 cm respectively. There was no evidence for internal mammary adenopathy, left axillary adenopathy, or significant findings in the left breast. Both lymph nodes and skin bx + for CA so staged at IIIb.   Echo form 02/23/12 reviewed in clinic EF 60%  Started chemo July 10 with dose dense cycles of doxorubicin/cyclophosphamide being given in the neoadjuvant setting. Has had 2 cycles already. Then planning bilateral mastectomies and XRT. Will start Herceptin after surgery.   Very active at baseline. No known heart problems. Has a h/o palpitations during pregnancy and saw Dr. Eldridge Dace and echo was normal. No edema, orthopnea, PND. No HTN. 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   Review of Systems: [y] = yes, [ ]  = no   General: Weight gain [ ] ; Weight loss [ ] ; Anorexia [ ] ; Fatigue [ ] ;  Fever [ ] ; Chills [ ] ; Weakness [ ]   Cardiac: Chest pain/pressure [ ] ; Resting SOB [ ] ; Exertional SOB [ ] ; Orthopnea [ ] ; Pedal Edema [ ] ; Palpitations [ ] ; Syncope [ ] ; Presyncope [ ] ; Paroxysmal nocturnal dyspnea[ ]   Pulmonary: Cough [ ] ; Wheezing[ ] ; Hemoptysis[ ] ; Sputum [ ] ; Snoring [ ]   GI: Vomiting[ ] ; Dysphagia[ ] ; Melena[ ] ; Hematochezia [ ] ; Heartburn[ ] ; Abdominal pain [ ] ; Constipation [ ] ; Diarrhea [ ] ; BRBPR [ ]   GU: Hematuria[ ] ; Dysuria [ ] ; Nocturia[ ]   Vascular: Pain in legs with walking [ ] ; Pain in feet with lying flat [ ] ; Non-healing sores [ ] ; Stroke [ ] ; TIA [ ] ; Slurred speech [ ] ;  Neuro: Headaches[ ] ; Vertigo[ ] ; Seizures[ ] ; Paresthesias[ ] ;Blurred vision [ ] ; Diplopia [ ] ; Vision changes [ ]   Ortho/Skin: Arthritis [ ] ; Joint pain [ ] ; Muscle pain [ ] ; Joint swelling [ ] ; Back Pain [ ] ; Rash [ ]   Psych: Depression[ ] ; Anxiety[ ]   Heme: Bleeding problems [ ] ; Clotting disorders [ ] ; Anemia [ ]   Endocrine: Diabetes [ ] ; Thyroid dysfunction[ ]   Home Medications Prior to Admission medications   Medication Sig Start Date End Date Taking? Authorizing Provider  dexamethasone (DECADRON) 4 MG tablet Take 8 mg by mouth 2 (two) times daily with a meal.   Yes Historical Provider, MD  lidocaine-prilocaine (EMLA) cream  02/29/12  Yes Historical Provider, MD  LORazepam (ATIVAN) 0.5 MG tablet  02/23/12  Yes Historical Provider, MD  metoCLOPramide (REGLAN) 10 MG tablet Take 1  tablet (10 mg total) by mouth 4 (four) times daily as needed. 03/08/12  Yes Amy Allegra Grana, PA  omeprazole (PRILOSEC) 20 MG capsule Take 1 capsule (20 mg total) by mouth daily. 03/08/12 03/08/13 Yes Amy Allegra Grana, PA  Probiotic Product (PROBIOTIC & ACIDOPHILUS EX ST) CAPS Take 1 capsule by mouth daily.     Yes Historical Provider, MD  promethazine (PHENERGAN) 25 MG tablet Take 25 mg by mouth every 6 (six) hours as needed. 03/07/12  Yes Lowella Dell, MD  SUMAtriptan (IMITREX) 50 MG tablet Take 1 tablet (50 mg total) by  mouth as directed. 03/02/12 03/02/13 Yes Lowella Dell, MD    Past Medical History: Past Medical History  Diagnosis Date  . Iron (Fe) deficiency anemia   . Asthma     exercise induced  . Breast cancer   . Back pain   . Migraines   . Lactose intolerance     Past Surgical History: Past Surgical History  Procedure Date  . Appendectomy   . Shoulder surgery   . Knee surgery     X2  . Portacath placement 02/21/2012    Procedure: INSERTION PORT-A-CATH;  Surgeon: Almond Lint, MD;  Location: [facility];  Service: General;  Laterality: N/A;  . Skin biopsy 02/21/2012    Procedure: BIOPSY SKIN;  Surgeon: Almond Lint, MD;  Location: [facility];  Service: General;  Laterality: Right;    Family History: Family History  Problem Relation Age of Onset  . Cancer Paternal Aunt 69    Breast cancer (half sibling, related through father)  . Spina bifida Paternal Uncle 0    died around 25-43 months of age  . Cancer Maternal Grandmother 20    colon cancer  . Dementia Maternal Grandfather   . Cancer Paternal Grandmother     breast cancer between 68-42  . Cancer Paternal Grandfather     brain cancer  . Cancer Other     Paternal grandmother's siblings had breast cancer and pancreatic cancer  . Cancer Other 96    maternal great grandmother with breast cancer    Social History: History   Social History  . Marital Status: Married    Spouse Name: N/A    Number of Children: N/A  . Years of Education: N/A   Social History Main Topics  . Smoking status: Never Smoker   . Smokeless tobacco: None  . Alcohol Use: 2.5 - 3.5 oz/week    5-7 drink(s) per week     OCCASIONAL  . Drug Use: No  . Sexually Active: Yes    Birth Control/ Protection: IUD   Other Topics Concern  . None   Social History Narrative  . None    Allergies:  Allergies  Allergen Reactions  . Zofran (Ondansetron Hcl) Other (See Comments)    Migraine headaches  . Antiseptic Products, Misc.   [name] (Antiseptic Products,  Misc.) Rash  . Penicillins Rash    Objective:    Vital Signs:   Pulse Rate:  [95] 95  (07/29 1414) BP: (102)/(85) 102/85 mmHg (07/29 1414) SpO2:  [99 %] 99 % (07/29 1414) Weight:  [172 lb 12.8 oz (78.382 kg)] 172 lb 12.8 oz (78.382 kg) (07/29 1414)    Weight change: Filed Weights   03/20/12 1414  Weight: 172 lb 12.8 oz (78.382 kg)    Intake/Output:  No intake or output data in the 24 hours ending 03/20/12 1432   Physical Exam: General:  Well appearing. No resp difficulty HEENT:  normal Neck: supple. JVP . Carotids 2+ bilat; no bruits. No lymphadenopathy or thryomegaly appreciated. Cor: PMI nondisplaced. Regular rate & rhythm. No rubs, gallops or murmurs. Lungs: clear Abdomen: soft, nontender, nondistended. No hepatosplenomegaly. No bruits or masses. Good bowel sounds. Extremities: no cyanosis, clubbing, rash, edema Neuro: alert & orientedx3, cranial nerves grossly intact. moves all 4 extremities w/o difficulty. Affect pleasant   Labs: Basic Metabolic Panel:  Lab 03/15/12 1610  NA 135  K 3.9  CL 101  CO2 24  GLUCOSE 92  BUN 16  CREATININE 0.88  CALCIUM 8.9  MG --  PHOS --    Liver Function Tests:  Lab 03/15/12 0926  AST 18  ALT 15  ALKPHOS 117  BILITOT 0.1*  PROT 6.9  ALBUMIN 3.8   No results found for this basename: LIPASE:5,AMYLASE:5 in the last 168 hours No results found for this basename: AMMONIA:3 in the last 168 hours  CBC:  Lab 03/15/12 0927  WBC 11.6*  NEUTROABS 8.2*  HGB 12.9  HCT 37.3  MCV 88.2  PLT 213

## 2012-03-22 ENCOUNTER — Other Ambulatory Visit (HOSPITAL_BASED_OUTPATIENT_CLINIC_OR_DEPARTMENT_OTHER): Payer: Managed Care, Other (non HMO) | Admitting: Lab

## 2012-03-22 ENCOUNTER — Ambulatory Visit (HOSPITAL_BASED_OUTPATIENT_CLINIC_OR_DEPARTMENT_OTHER): Payer: Managed Care, Other (non HMO) | Admitting: Physician Assistant

## 2012-03-22 ENCOUNTER — Telehealth: Payer: Self-pay | Admitting: *Deleted

## 2012-03-22 ENCOUNTER — Encounter: Payer: Self-pay | Admitting: Physician Assistant

## 2012-03-22 VITALS — BP 99/66 | HR 106 | Temp 98.5°F | Ht 68.0 in | Wt 171.5 lb

## 2012-03-22 DIAGNOSIS — C50419 Malignant neoplasm of upper-outer quadrant of unspecified female breast: Secondary | ICD-10-CM

## 2012-03-22 LAB — CBC WITH DIFFERENTIAL/PLATELET
Basophils Absolute: 0 10*3/uL (ref 0.0–0.1)
Eosinophils Absolute: 0 10*3/uL (ref 0.0–0.5)
HGB: 12.7 g/dL (ref 11.6–15.9)
MCV: 90.8 fL (ref 79.5–101.0)
MONO#: 0.5 10*3/uL (ref 0.1–0.9)
MONO%: 7.7 % (ref 0.0–14.0)
NEUT#: 4.7 10*3/uL (ref 1.5–6.5)
RDW: 12.7 % (ref 11.2–14.5)
WBC: 6.8 10*3/uL (ref 3.9–10.3)

## 2012-03-22 NOTE — Telephone Encounter (Signed)
Gave patient appointment for mri of the breast on 04-17-2012 at the [facility] long

## 2012-03-22 NOTE — Telephone Encounter (Signed)
Per voice mail from the patient, she requested earlier appts on 8/21. I have no opening for the MD visit. I have called her back and left her a message.  JMW

## 2012-03-22 NOTE — Telephone Encounter (Signed)
Per staff message and POF I have scheduled appts.  JMW  

## 2012-03-22 NOTE — Progress Notes (Signed)
ID: Jody Dunn   DOB: 03/30/1976  MR#: [id number]  MVH#:[id number]  HISTORY OF PRESENT ILLNESS: [name] had a mammogram at [facility] of 2010 because of shooting pains in the right breast. This found no worrisome finding. More recently, in the spring of 2012, the patient at delivered her second child. She nursed but the child refused to take milk from the right breast. The patient tells me that she has been able to palpate easily from the right breast, so production is not an issue. More recently, early June 2013, the patient felt a new lump in the right breast, and brought this to the attention of [name] at [facility]. The patient had repeat mammography at [facility] 02/07/2012. This found at the breast tissue to be heterogeneously dense, which is not a surprise given that the patient is lactating. There was diffuse skin thickening. Erythema is not described. The nipple was retracted. There was no definitive peau d'orange appearance. Pleomorphic calcifications in the lateral right breast extended approximately 11 cm, without definite mass. On ultrasound there was a vague irregular hypoechoic mass measuring approximately 15 mm in the right breast, with a second ill-defined hypoechoic lobulated mass measuring approximately 17 mm. Both these masses were biopsied 02/09/2012, and the final pathology ([id number]) showed both IIb invasive ductal carcinoma, with a prognostic panel (from the mass at 11:00) as follows: estrogen receptor positive at 90%, progesterone receptor positive at 90%, Mib-1 of 30%, and HER-2 amplification with a ratio of 4.29 by CISH. MRI of the breast was performed 02/15/2012 and showed the mass in the 10:00 position to measure 3.1 cm, the one at the 11:00 position to measure 3.1 cm. There was diffuse skin thickening in the right breast and 2 enhancing abnormal-appearing lower right axillary lymph nodes measuring 2.0 and 1.8 cm respectively. There was no evidence for internal mammary  adenopathy, left axillary adenopathy, or significant findings in the left breast.  INTERVAL HISTORY: Jody Dunn returns today for followup of her locally advanced right breast carcinoma. She is currently day 8 cycle 2 of 4 planned dose dense cycles of doxorubicin/cyclophosphamide being given in the neoadjuvant setting.   Interval history is remarkable for Jody Dunn having had acupuncture x2 days following chemotherapy. She feels that this helped significantly with her side effects. She's also been wearing a necklace may of hazelwood and citrine which is supposed to decrease reflux. Her reflux has resolved, and she is taking no omeprazole currently. She did utilize some metoclopramide along with an occasional Phenergan, but did not take promethazine as with cycle 1. Her nausea was very well-controlled, and she had no changes in vision or dizziness.  REVIEW OF SYSTEMS:  In fact, she had few additional side effects with cycle 2.  [name] denies any fevers or chills. She's had no rashes or skin changes. Her hair is falling out, and she is getting her head "buzzed" tonight . No mouth ulcers or oral sensitivity. She's had no signs of abnormal bleeding.  She's had no change in bowel habits. No cough or increased shortness of breath, and no chest pain. No current dizziness. No abnormal headaches or change in vision. No unusual myalgias or arthralgias. No peripheral swelling.  A detailed review of systems is otherwise noncontributory.   PAST MEDICAL HISTORY: Past Medical History  Diagnosis Date  . Iron (Fe) deficiency anemia   . Asthma     exercise induced  . Breast cancer   . Back pain   . Migraines   . Lactose  intolerance   . Hyperlipidemia     PAST SURGICAL HISTORY: Past Surgical History  Procedure Date  . Appendectomy   . Shoulder surgery   . Knee surgery     X2  . Portacath placement 02/21/2012    Procedure: INSERTION PORT-A-CATH;  Surgeon: Almond Lint, MD;  Location: [facility];  Service: General;   Laterality: N/A;  . Skin biopsy 02/21/2012    Procedure: BIOPSY SKIN;  Surgeon: Almond Lint, MD;  Location: [facility];  Service: General;  Laterality: Right;    FAMILY HISTORY Family History  Problem Relation Age of Onset  . Cancer Paternal Aunt 75    Breast cancer (half sibling, related through father)  . Spina bifida Paternal Uncle 0    died around 3-8 months of age  . Cancer Maternal Grandmother 37    colon cancer  . Dementia Maternal Grandfather   . Cancer Paternal Grandmother     breast cancer between 1-42  . Cancer Paternal Grandfather     brain cancer  . Cancer Other     Paternal grandmother's siblings had breast cancer and pancreatic cancer  . Cancer Other 10    maternal great grandmother with breast cancer   the patient's parents are alive, in their early 72s. The patient is an only child. There is a significant family history of cancer including breast, brain, and colon, detailed in our genetic counselors report. BRCA and P. 53 testing is in progress.  GYNECOLOGIC HISTORY: Menarche age 65; she is GX P2, first live birth age 25. Most recent period was within the last month. She has a paraguard IUD in place. She has not had periods for a long time since she has been breast-feeding.  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 works as a Furniture conservator/restorer for [employer]. Their children are Jody Dunn (2008) and Jody Dunn (2012).   ADVANCED DIRECTIVES: not in place  HEALTH MAINTENANCE: History  Substance Use Topics  . Smoking status: Never Smoker   . Smokeless tobacco: Never Used  . Alcohol Use: 2.5 - 3.5 oz/week    5-7 drink(s) per week     OCCASIONAL     Colonoscopy: no  PAP: 2012  Bone density: no  Lipid panel:  Allergies  Allergen Reactions  . Zofran (Ondansetron Hcl) Other (See Comments)    Migraine headaches  . Antiseptic Products, Misc.   [name] (Antiseptic Products, Misc.) Rash  .  Penicillins Rash    Current Outpatient Prescriptions  Medication Sig Dispense Refill  . dexamethasone (DECADRON) 4 MG tablet Take 8 mg by mouth 2 (two) times daily with a meal.      . lidocaine-prilocaine (EMLA) cream       . LORazepam (ATIVAN) 0.5 MG tablet       . metoCLOPramide (REGLAN) 10 MG tablet Take 1 tablet (10 mg total) by mouth 4 (four) times daily as needed.  30 tablet  3  . omeprazole (PRILOSEC) 20 MG capsule Take 1 capsule (20 mg total) by mouth daily.  60 capsule  4  . Probiotic Product (PROBIOTIC & ACIDOPHILUS EX ST) CAPS Take 1 capsule by mouth daily.        . promethazine (PHENERGAN) 25 MG tablet Take 25 mg by mouth every 6 (six) hours as needed.      . SUMAtriptan (IMITREX) 50 MG tablet Take 1 tablet (50 mg total) by mouth as directed.  10 tablet  6  OBJECTIVE: Young white woman who appears comfortable and in no acute distress. Filed Vitals:   03/22/12 1340  BP: 99/66  Pulse: 106  Temp: 98.5 F (36.9 C)     Body mass index is 26.08 kg/(m^2).    ECOG FS: 1 Filed Weights   03/22/12 1340  Weight: 171 lb 8 oz (77.792 kg)   Physical Exam: HEENT:  Sclerae anicteric.  Oropharynx clear.   Nodes:  No cervical, supraclavicular, or axillary lymphadenopathy palpated.  Breast Exam:  Deferred today. Lungs:  Clear to auscultation bilaterally.  No crackles, rhonchi, or wheezes.   Heart:  Regular rate and rhythm.   Abdomen:  Soft, nontender.  Positive bowel sounds.  No organomegaly or masses palpated.   Musculoskeletal:  No focal spinal tenderness to palpation.  Extremities:  Benign.  No peripheral edema or cyanosis.   Skin:  Benign.   Neuro:  Nonfocal.  Alert and oriented x3.    LAB RESULTS: Lab Results  Component Value Date   WBC 6.8 03/22/2012   NEUTROABS 4.7 03/22/2012   HGB 12.7 03/22/2012   HCT 37.5 03/22/2012   MCV 90.8 03/22/2012   PLT 215 03/22/2012      Chemistry      Component Value Date/Time   NA 135 03/15/2012 0926   K 3.9 03/15/2012 0926   CL 101  03/15/2012 0926   CO2 24 03/15/2012 0926   BUN 16 03/15/2012 0926   CREATININE 0.88 03/15/2012 0926      Component Value Date/Time   CALCIUM 8.9 03/15/2012 0926   ALKPHOS 117 03/15/2012 0926   AST 18 03/15/2012 0926   ALT 15 03/15/2012 0926   BILITOT 0.1* 03/15/2012 0926       Lab Results  Component Value Date   LABCA2 28 02/16/2012     STUDIES:  Echo July 3 showed an EF of 60%.     ASSESSMENT: 36 y.o. [place] woman   (1)  status post right breast biopsy 02/09/2012 for multifocal pT4 cN1, stage IIIB invasive ductal carcinoma. Prognostic profile from one of the 2 masses shows it to be triple positive with an MIB-1 of 30%.   (2)  being treated in the neoadjuvant setting, the goal being to complete 4 dose dense cycles of doxorubicin/Adriamycin, followed by 12 dose dense cycles of paclitaxel given along with trastuzumab. The trastuzumab will be continued for total of one year.  (3)  BRCA 1 and 2 negative  PLAN:  Temprence has tolerated the second cycle of chemotherapy extremely well. She will return next week as scheduled on August 7 for her third dose of doxorubicin/cyclophosphamide. We will continue with the same regimen of antinausea medications, specifically the metoclopramide and promethazine as with cycle 2. She also plans to continue with her acupuncture which she finds very helpful.  Asher Muir is scheduled through the end of September, and  I am also scheduling her for her repeat breast MRI in late August prior to initiating treatment with paclitaxel/trastuzumab.  Kerianna Rawlinson    03/22/2012

## 2012-03-24 ENCOUNTER — Telehealth: Payer: Self-pay | Admitting: Oncology

## 2012-03-24 NOTE — Telephone Encounter (Signed)
Genetic letter mailed out.  °

## 2012-03-29 ENCOUNTER — Ambulatory Visit (HOSPITAL_BASED_OUTPATIENT_CLINIC_OR_DEPARTMENT_OTHER): Payer: Managed Care, Other (non HMO) | Admitting: Physician Assistant

## 2012-03-29 ENCOUNTER — Ambulatory Visit (HOSPITAL_BASED_OUTPATIENT_CLINIC_OR_DEPARTMENT_OTHER): Payer: Managed Care, Other (non HMO)

## 2012-03-29 ENCOUNTER — Other Ambulatory Visit (HOSPITAL_BASED_OUTPATIENT_CLINIC_OR_DEPARTMENT_OTHER): Payer: Managed Care, Other (non HMO) | Admitting: Lab

## 2012-03-29 ENCOUNTER — Telehealth: Payer: Self-pay | Admitting: Oncology

## 2012-03-29 ENCOUNTER — Encounter: Payer: Self-pay | Admitting: Physician Assistant

## 2012-03-29 VITALS — BP 119/80 | HR 94 | Temp 98.1°F | Resp 20 | Ht 68.0 in | Wt 170.1 lb

## 2012-03-29 DIAGNOSIS — Z5111 Encounter for antineoplastic chemotherapy: Secondary | ICD-10-CM

## 2012-03-29 DIAGNOSIS — C50419 Malignant neoplasm of upper-outer quadrant of unspecified female breast: Secondary | ICD-10-CM

## 2012-03-29 DIAGNOSIS — Z170 Estrogen receptor positive status [ER+]: Secondary | ICD-10-CM

## 2012-03-29 DIAGNOSIS — C50919 Malignant neoplasm of unspecified site of unspecified female breast: Secondary | ICD-10-CM

## 2012-03-29 LAB — CBC WITH DIFFERENTIAL/PLATELET
Basophils Absolute: 0.1 10*3/uL (ref 0.0–0.1)
Eosinophils Absolute: 0 10*3/uL (ref 0.0–0.5)
HCT: 35.8 % (ref 34.8–46.6)
HGB: 12 g/dL (ref 11.6–15.9)
LYMPH%: 13.9 % — ABNORMAL LOW (ref 14.0–49.7)
MCV: 90.7 fL (ref 79.5–101.0)
MONO#: 0.6 10*3/uL (ref 0.1–0.9)
MONO%: 4.2 % (ref 0.0–14.0)
NEUT#: 11.7 10*3/uL — ABNORMAL HIGH (ref 1.5–6.5)
NEUT%: 81.1 % — ABNORMAL HIGH (ref 38.4–76.8)
Platelets: 269 10*3/uL (ref 145–400)
RBC: 3.95 10*6/uL (ref 3.70–5.45)

## 2012-03-29 MED ORDER — SODIUM CHLORIDE 0.9 % IJ SOLN
10.0000 mL | INTRAMUSCULAR | Status: DC | PRN
  Administered 2012-03-29: 10 mL
  Filled 2012-03-29: qty 10

## 2012-03-29 MED ORDER — HEPARIN SOD (PORK) LOCK FLUSH 100 UNIT/ML IV SOLN
500.0000 [IU] | Freq: Once | INTRAVENOUS | Status: AC | PRN
  Administered 2012-03-29: 500 [IU]
  Filled 2012-03-29: qty 5

## 2012-03-29 MED ORDER — DEXAMETHASONE SODIUM PHOSPHATE 4 MG/ML IJ SOLN
12.0000 mg | Freq: Once | INTRAMUSCULAR | Status: AC
  Administered 2012-03-29: 12 mg via INTRAVENOUS

## 2012-03-29 MED ORDER — SODIUM CHLORIDE 0.9 % IV SOLN
600.0000 mg/m2 | Freq: Once | INTRAVENOUS | Status: AC
  Administered 2012-03-29: 1140 mg via INTRAVENOUS
  Filled 2012-03-29: qty 57

## 2012-03-29 MED ORDER — PALONOSETRON HCL INJECTION 0.25 MG/5ML
0.2500 mg | Freq: Once | INTRAVENOUS | Status: AC
  Administered 2012-03-29: 0.25 mg via INTRAVENOUS

## 2012-03-29 MED ORDER — DOXORUBICIN HCL CHEMO IV INJECTION 2 MG/ML
60.0000 mg/m2 | Freq: Once | INTRAVENOUS | Status: AC
  Administered 2012-03-29: 114 mg via INTRAVENOUS
  Filled 2012-03-29: qty 57

## 2012-03-29 MED ORDER — SODIUM CHLORIDE 0.9 % IV SOLN
150.0000 mg | Freq: Once | INTRAVENOUS | Status: AC
  Administered 2012-03-29: 150 mg via INTRAVENOUS
  Filled 2012-03-29: qty 5

## 2012-03-29 MED ORDER — SODIUM CHLORIDE 0.9 % IV SOLN
Freq: Once | INTRAVENOUS | Status: AC
  Administered 2012-03-29: 11:00:00 via INTRAVENOUS

## 2012-03-29 NOTE — Telephone Encounter (Signed)
gve the pt her aug,sept 2013 appt calendar °

## 2012-03-29 NOTE — Progress Notes (Signed)
ID: Jody Dunn   DOB: [birth year]  MR#: [id number]  CSN#:[id number]  HISTORY OF PRESENT ILLNESS: Jody Dunn had a mammogram at [facility] of 2010 because of shooting pains in the right breast. This found no worrisome finding. More recently, in the spring of 2012, the patient at delivered her second child. She nursed but the child refused to take milk from the right breast. The patient tells me that she has been able to palpate easily from the right breast, so production is not an issue. More recently, early June 2013, the patient felt a new lump in the right breast, and brought this to the attention of [name] at [facility]. The patient had repeat mammography at [facility] 02/07/2012. This found at the breast tissue to be heterogeneously dense, which is not a surprise given that the patient is lactating. There was diffuse skin thickening. Erythema is not described. The nipple was retracted. There was no definitive peau d'orange appearance. Pleomorphic calcifications in the lateral right breast extended approximately 11 cm, without definite mass. On ultrasound there was a vague irregular hypoechoic mass measuring approximately 15 mm in the right breast, with a second ill-defined hypoechoic lobulated mass measuring approximately 17 mm. Both these masses were biopsied 02/09/2012, and the final pathology ([id number]) showed both IIb invasive ductal carcinoma, with a prognostic panel (from the mass at 11:00) as follows: estrogen receptor positive at 90%, progesterone receptor positive at 90%, Mib-1 of 30%, and HER-2 amplification with a ratio of 4.29 by CISH. MRI of the breast was performed 02/15/2012 and showed the mass in the 10:00 position to measure 3.1 cm, the one at the 11:00 position to measure 3.1 cm. There was diffuse skin thickening in the right breast and 2 enhancing abnormal-appearing lower right axillary lymph nodes measuring 2.0 and 1.8 cm respectively. There was no evidence for internal mammary  adenopathy, left axillary adenopathy, or significant findings in the left breast.  INTERVAL HISTORY: Jody Dunn returns today for followup of her locally advanced right breast carcinoma. She is due for day 1 cycle 3 of 4 planned dose dense cycles of doxorubicin/cyclophosphamide being given in the neoadjuvant setting.   Interval history is generally unremarkable. She continues to receive acupuncture which she finds very helpful. Her reflux has resolved and she's had no problems with nausea. She has a slight headache today, but otherwise no additional complaints.   REVIEW OF SYSTEMS: Jody Dunn denies any fevers or chills. She's had no rashes or skin changes.  No mouth ulcers or oral sensitivity. She's had no signs of abnormal bleeding.  She's had no change in bowel habits. No cough or increased shortness of breath, and no chest pain. No current dizziness or change in vision. No unusual myalgias or arthralgias. No peripheral swelling.  A detailed review of systems is otherwise noncontributory.   PAST MEDICAL HISTORY: Past Medical History  Diagnosis Date  . Iron (Fe) deficiency anemia   . Asthma     exercise induced  . Breast cancer   . Back pain   . Migraines   . Lactose intolerance   . Hyperlipidemia     PAST SURGICAL HISTORY: Past Surgical History  Procedure Date  . Appendectomy   . Shoulder surgery   . Knee surgery     X2  . Portacath placement 02/21/2012    Procedure: INSERTION PORT-A-CATH;  Surgeon: Almond Lint, MD;  Location: [facility];  Service: General;  Laterality: N/A;  . Skin biopsy 02/21/2012    Procedure: BIOPSY SKIN;  Surgeon: Almond Lint, MD;  Location: [facility];  Service: General;  Laterality: Right;    FAMILY HISTORY Family History  Problem Relation Age of Onset  . Cancer Paternal Aunt 63    Breast cancer (half sibling, related through father)  . Spina bifida Paternal Uncle 0    died around 79-41 months of age  . Cancer Maternal Grandmother 70    colon cancer  . Dementia  Maternal Grandfather   . Cancer Paternal Grandmother     breast cancer between 20-42  . Cancer Paternal Grandfather     brain cancer  . Cancer Other     Paternal grandmother's siblings had breast cancer and pancreatic cancer  . Cancer Other 55    maternal great grandmother with breast cancer   the patient's parents are alive, in their early 59s. The patient is an only child. There is a significant family history of cancer including breast, brain, and colon, detailed in our genetic counselors report. BRCA and P. 53 testing is in progress.  GYNECOLOGIC HISTORY: Menarche age 19; she is GX P2, first live birth age 55. Most recent period was within the last month. She has a paraguard IUD in place. She has not had periods for a long time since she has been breast-feeding.  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 works as a Furniture conservator/restorer for [employer]. Their children are Jody Dunn (2008) and Jody Dunn (2012).   ADVANCED DIRECTIVES: not in place  HEALTH MAINTENANCE: History  Substance Use Topics  . Smoking status: Never Smoker   . Smokeless tobacco: Never Used  . Alcohol Use: 2.5 - 3.5 oz/week    5-7 drink(s) per week     OCCASIONAL     Colonoscopy: no  PAP: 2012  Bone density: no  Lipid panel:  Allergies  Allergen Reactions  . Zofran (Ondansetron Hcl) Other (See Comments)    Migraine headaches  . Antiseptic Products, Misc.   [name] (Antiseptic Products, Misc.) Rash  . Penicillins Rash    Current Outpatient Prescriptions  Medication Sig Dispense Refill  . dexamethasone (DECADRON) 4 MG tablet Take 8 mg by mouth 2 (two) times daily with a meal.      . lidocaine-prilocaine (EMLA) cream       . LORazepam (ATIVAN) 0.5 MG tablet       . metoCLOPramide (REGLAN) 10 MG tablet Take 1 tablet (10 mg total) by mouth 4 (four) times daily as needed.  30 tablet  3  . omeprazole (PRILOSEC) 20 MG capsule Take 1  capsule (20 mg total) by mouth daily.  60 capsule  4  . Probiotic Product (PROBIOTIC & ACIDOPHILUS EX ST) CAPS Take 1 capsule by mouth daily.        . promethazine (PHENERGAN) 25 MG tablet Take 25 mg by mouth every 6 (six) hours as needed.      . SUMAtriptan (IMITREX) 50 MG tablet Take 1 tablet (50 mg total) by mouth as directed.  10 tablet  6    OBJECTIVE: Young white woman who appears comfortable and in no acute distress. Filed Vitals:   03/29/12 1028  BP: 119/80  Pulse: 94  Temp: 98.1 F (36.7 C)  Resp: 20     Body mass index is 25.86 kg/(m^2).    ECOG FS: 1 Filed Weights   03/29/12 1028  Weight: 170 lb 1.6 oz (77.157 kg)   Physical Exam: HEENT:  Sclerae anicteric.  Oropharynx clear.   Nodes:  No cervical, supraclavicular, or axillary lymphadenopathy palpated.  Breast Exam:  I found it more difficult to palpate a distinct mass in the right breast today. I will note that the skin changes/dimpling has resolved. Lungs:  Clear to auscultation bilaterally.  No crackles, rhonchi, or wheezes.   Heart:  Regular rate and rhythm.   Abdomen:  Soft, nontender.  Positive bowel sounds.  No organomegaly or masses palpated.   Musculoskeletal:  No focal spinal tenderness to palpation.  Extremities:  Benign.  No peripheral edema or cyanosis.   Skin:  Benign.   Neuro:  Nonfocal.  Alert and oriented x3.    LAB RESULTS: Lab Results  Component Value Date   WBC 14.5* 03/29/2012   NEUTROABS 11.7* 03/29/2012   HGB 12.0 03/29/2012   HCT 35.8 03/29/2012   MCV 90.7 03/29/2012   PLT 269 03/29/2012      Chemistry      Component Value Date/Time   NA 135 03/15/2012 0926   K 3.9 03/15/2012 0926   CL 101 03/15/2012 0926   CO2 24 03/15/2012 0926   BUN 16 03/15/2012 0926   CREATININE 0.88 03/15/2012 0926      Component Value Date/Time   CALCIUM 8.9 03/15/2012 0926   ALKPHOS 117 03/15/2012 0926   AST 18 03/15/2012 0926   ALT 15 03/15/2012 0926   BILITOT 0.1* 03/15/2012 0926       Lab Results  Component Value  Date   LABCA2 28 02/16/2012     STUDIES:  Echo July 3 showed an EF of 60%.     ASSESSMENT: 36 y.o. [place] woman   (1)  status post right breast biopsy 02/09/2012 for multifocal pT4 cN1, stage IIIB invasive ductal carcinoma. Prognostic profile from one of the 2 masses shows it to be triple positive with an MIB-1 of 30%.   (2)  being treated in the neoadjuvant setting, the goal being to complete 4 dose dense cycles of doxorubicin/Adriamycin, followed by 12 dose dense cycles of paclitaxel given along with trastuzumab. The trastuzumab will be continued for total of one year.  (3)  BRCA 1 and 2 negative  PLAN:  Coreena  will proceed to treatment today as scheduled for her third neoadjuvant dose of doxorubicin/cyclophosphamide. She will continue with the same regimen of antinausea medications, specifically the metoclopramide and promethazine as with cycle 2. She also plans to continue with her acupuncture which she finds very helpful.  Asher Muir receive her Neulasta injection tomorrow, and will return to see me next week on August 14 for assessment of chemotoxicity. She will call meanwhile if any changes or problems.  Maleya Leever    03/29/2012

## 2012-03-29 NOTE — Patient Instructions (Signed)
[facility] Discharge Instructions for Patients Receiving Chemotherapy  Today you received the following chemotherapy agents Adriamycin/Cytoxan To help prevent nausea and vomiting after your treatment, we encourage you to take your nausea medication as prescribed.  If you develop nausea and vomiting that is not controlled by your nausea medication, call the clinic. If it is after clinic hours your family physician or the after hours number for the clinic or go to the Emergency Department.   BELOW ARE SYMPTOMS THAT SHOULD BE REPORTED IMMEDIATELY:  *FEVER GREATER THAN 100.5 F  *CHILLS WITH OR WITHOUT FEVER  NAUSEA AND VOMITING THAT IS NOT CONTROLLED WITH YOUR NAUSEA MEDICATION  *UNUSUAL SHORTNESS OF BREATH  *UNUSUAL BRUISING OR BLEEDING  TENDERNESS IN MOUTH AND THROAT WITH OR WITHOUT PRESENCE OF ULCERS  *URINARY PROBLEMS  *BOWEL PROBLEMS  UNUSUAL RASH Items with * indicate a potential emergency and should be followed up as soon as possible.  One of the nurses will contact you 24 hours after your treatment. Please let the nurse know about any problems that you may have experienced. Feel free to call the clinic you have any questions or concerns. The clinic phone number is [phone].   I have been informed and understand all the instructions given to me. I know to contact the clinic, my physician, or go to the Emergency Department if any problems should occur. I do not have any questions at this time, but understand that I may call the clinic during office hours   should I have any questions or need assistance in obtaining follow up care.    __________________________________________  _____________  __________ Signature of Patient or Authorized Representative            Date                   Time    __________________________________________ Nurse's Signature    

## 2012-03-30 ENCOUNTER — Ambulatory Visit (HOSPITAL_BASED_OUTPATIENT_CLINIC_OR_DEPARTMENT_OTHER): Payer: Managed Care, Other (non HMO)

## 2012-03-30 VITALS — BP 118/83 | HR 86 | Temp 97.3°F

## 2012-03-30 DIAGNOSIS — C50419 Malignant neoplasm of upper-outer quadrant of unspecified female breast: Secondary | ICD-10-CM

## 2012-03-30 DIAGNOSIS — C50919 Malignant neoplasm of unspecified site of unspecified female breast: Secondary | ICD-10-CM

## 2012-03-30 DIAGNOSIS — Z5189 Encounter for other specified aftercare: Secondary | ICD-10-CM

## 2012-03-30 MED ORDER — PEGFILGRASTIM INJECTION 6 MG/0.6ML
6.0000 mg | Freq: Once | SUBCUTANEOUS | Status: AC
  Administered 2012-03-30: 6 mg via SUBCUTANEOUS
  Filled 2012-03-30: qty 0.6

## 2012-04-05 ENCOUNTER — Telehealth: Payer: Self-pay | Admitting: *Deleted

## 2012-04-05 ENCOUNTER — Encounter: Payer: Self-pay | Admitting: Physician Assistant

## 2012-04-05 ENCOUNTER — Ambulatory Visit (HOSPITAL_BASED_OUTPATIENT_CLINIC_OR_DEPARTMENT_OTHER): Payer: Managed Care, Other (non HMO) | Admitting: Physician Assistant

## 2012-04-05 ENCOUNTER — Other Ambulatory Visit (HOSPITAL_BASED_OUTPATIENT_CLINIC_OR_DEPARTMENT_OTHER): Payer: Managed Care, Other (non HMO) | Admitting: Lab

## 2012-04-05 VITALS — BP 129/87 | HR 96 | Temp 98.7°F | Resp 20 | Ht 68.0 in | Wt 171.9 lb

## 2012-04-05 DIAGNOSIS — Z170 Estrogen receptor positive status [ER+]: Secondary | ICD-10-CM

## 2012-04-05 DIAGNOSIS — C50419 Malignant neoplasm of upper-outer quadrant of unspecified female breast: Secondary | ICD-10-CM

## 2012-04-05 LAB — CBC WITH DIFFERENTIAL/PLATELET
BASO%: 0.3 % (ref 0.0–2.0)
HCT: 33.9 % — ABNORMAL LOW (ref 34.8–46.6)
LYMPH%: 17.4 % (ref 14.0–49.7)
MCH: 30.6 pg (ref 25.1–34.0)
MCHC: 33.9 g/dL (ref 31.5–36.0)
MCV: 90.2 fL (ref 79.5–101.0)
MONO%: 6.3 % (ref 0.0–14.0)
NEUT%: 75.6 % (ref 38.4–76.8)
Platelets: 226 10*3/uL (ref 145–400)
RBC: 3.76 10*6/uL (ref 3.70–5.45)

## 2012-04-05 NOTE — Progress Notes (Signed)
ID: Jody Dunn   DOB: August 29, 1975  MR#: [id number]  CSN#:[id number]  HISTORY OF PRESENT ILLNESS: Jody Dunn had a mammogram at [facility] of 2010 because of shooting pains in the right breast. This found no worrisome finding. More recently, in the spring of 2012, the patient at delivered her second child. She nursed but the child refused to take milk from the right breast. The patient tells me that she has been able to palpate easily from the right breast, so production is not an issue. More recently, early June 2013, the patient felt a new lump in the right breast, and brought this to the attention of [name] at [facility]. The patient had repeat mammography at [facility] 02/07/2012. This found at the breast tissue to be heterogeneously dense, which is not a surprise given that the patient is lactating. There was diffuse skin thickening. Erythema is not described. The nipple was retracted. There was no definitive peau d'orange appearance. Pleomorphic calcifications in the lateral right breast extended approximately 11 cm, without definite mass. On ultrasound there was a vague irregular hypoechoic mass measuring approximately 15 mm in the right breast, with a second ill-defined hypoechoic lobulated mass measuring approximately 17 mm. Both these masses were biopsied 02/09/2012, and the final pathology ([id number]) showed both IIb invasive ductal carcinoma, with a prognostic panel (from the mass at 11:00) as follows: estrogen receptor positive at 90%, progesterone receptor positive at 90%, Mib-1 of 30%, and HER-2 amplification with a ratio of 4.29 by C[facility]. MRI of the breast was performed 02/15/2012 and showed the mass in the 10:00 position to measure 3.1 cm, the one at the 11:00 position to measure 3.1 cm. There was diffuse skin thickening in the right breast and 2 enhancing abnormal-appearing lower right axillary lymph nodes measuring 2.0 and 1.8 cm respectively. There was no evidence for internal mammary  adenopathy, left axillary adenopathy, or significant findings in the left breast.  INTERVAL HISTORY: Jody Dunn returns today for followup of her locally advanced right breast carcinoma. She is currently day 8 cycle 3 of 4 planned dose dense cycles of doxorubicin/cyclophosphamide being given in the neoadjuvant setting. She received Neulasta on day 2 for granulocyte support.  Interval history is generally unremarkable. She continues to receive acupuncture which she finds very helpful and she's had no problems with nausea.   Jody Dunn has met with both Dr. Odis Luster and Dr. Kelly Splinter this week to discuss possibilities for reconstruction. She is still considering her options.   REVIEW OF SYSTEMS: [name] denies any fevers or chills. She's had no rashes or skin changes.  No mouth ulcers or oral sensitivity. She's had no signs of abnormal bleeding.  She's had no change in bowel habits. No cough or increased shortness of breath, and no chest pain. No current dizziness or change in vision. No unusual myalgias or arthralgias. No peripheral swelling.  A detailed review of systems is otherwise noncontributory.   PAST MEDICAL HISTORY: Past Medical History  Diagnosis Date  . Iron (Fe) deficiency anemia   . Asthma     exercise induced  . Breast cancer   . Back pain   . Migraines   . Lactose intolerance   . Hyperlipidemia     PAST SURGICAL HISTORY: Past Surgical History  Procedure Date  . Appendectomy   . Shoulder surgery   . Knee surgery     X2  . Portacath placement 02/21/2012    Procedure: INSERTION PORT-A-CATH;  Surgeon: Almond Lint, MD;  Location: [facility];  Service: General;  Laterality: N/A;  . Skin biopsy 02/21/2012    Procedure: BIOPSY SKIN;  Surgeon: Almond Lint, MD;  Location: [facility];  Service: General;  Laterality: Right;    FAMILY HISTORY Family History  Problem Relation Age of Onset  . Cancer Paternal Aunt 67    Breast cancer (half sibling, related through father)  . Spina bifida Paternal  Uncle 0    died around 5-62 months of age  . Cancer Maternal Grandmother 63    colon cancer  . Dementia Maternal Grandfather   . Cancer Paternal Grandmother     breast cancer between 22-42  . Cancer Paternal Grandfather     brain cancer  . Cancer Other     Paternal grandmother's siblings had breast cancer and pancreatic cancer  . Cancer Other 41    maternal great grandmother with breast cancer   the patient's parents are alive, in their early 34s. The patient is an only child. There is a significant family history of cancer including breast, brain, and colon, detailed in our genetic counselors report. BRCA and P. 53 testing is in progress.  GYNECOLOGIC HISTORY: Menarche age 36; she is GX P2, first live birth age 9. Most recent period was within the last month. She has a paraguard IUD in place. She has not had periods for a long time since she has been breast-feeding.  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 works as a Furniture conservator/restorer for [employer]. Their children are Jody Dunn (2008) and Jody Dunn (2012).   ADVANCED DIRECTIVES: not in place  HEALTH MAINTENANCE: History  Substance Use Topics  . Smoking status: Never Smoker   . Smokeless tobacco: Never Used  . Alcohol Use: 2.5 - 3.5 oz/week    5-7 drink(s) per week     OCCASIONAL     Colonoscopy: no  PAP: 2012  Bone density: no  Lipid panel:  Allergies  Allergen Reactions  . Zofran (Ondansetron Hcl) Other (See Comments)    Migraine headaches  . Antiseptic Products, Misc.   [name] (Antiseptic Products, Misc.) Rash  . Penicillins Rash    Current Outpatient Prescriptions  Medication Sig Dispense Refill  . dexamethasone (DECADRON) 4 MG tablet Take 8 mg by mouth 2 (two) times daily with a meal.      . lidocaine-prilocaine (EMLA) cream       . LORazepam (ATIVAN) 0.5 MG tablet       . metoCLOPramide (REGLAN) 10 MG tablet Take 1 tablet (10 mg  total) by mouth 4 (four) times daily as needed.  30 tablet  3  . omeprazole (PRILOSEC) 20 MG capsule Take 1 capsule (20 mg total) by mouth daily.  60 capsule  4  . Probiotic Product (PROBIOTIC & ACIDOPHILUS EX ST) CAPS Take 1 capsule by mouth daily.        . promethazine (PHENERGAN) 25 MG tablet Take 25 mg by mouth every 6 (six) hours as needed.      . SUMAtriptan (IMITREX) 50 MG tablet Take 1 tablet (50 mg total) by mouth as directed.  10 tablet  6    OBJECTIVE: Young white woman who appears comfortable and in no acute distress. Filed Vitals:   04/05/12 1416  BP: 129/87  Pulse: 96  Temp: 98.7 F (37.1 C)  Resp: 20     Body mass index is 26.14 kg/(m^2).    ECOG FS: 1 Filed Weights   04/05/12 1416  Weight: 171 lb 14.4 oz (77.973 kg)   Physical Exam: HEENT:  Sclerae anicteric.  Oropharynx clear.   Nodes:  No cervical, supraclavicular, or axillary lymphadenopathy palpated.  Breast Exam:  Deferred Lungs:  Clear to auscultation bilaterally.  No crackles, rhonchi, or wheezes.   Heart:  Regular rate and rhythm.   Abdomen:  Soft, nontender.  Positive bowel sounds.  No organomegaly or masses palpated.   Musculoskeletal:  No focal spinal tenderness to palpation.  Extremities:  Benign.  No peripheral edema or cyanosis.   Skin:  Benign.   Neuro:  Nonfocal.  Alert and oriented x3.    LAB RESULTS: Lab Results  Component Value Date   WBC 7.9 04/05/2012   NEUTROABS 6.0 04/05/2012   HGB 11.5* 04/05/2012   HCT 33.9* 04/05/2012   MCV 90.2 04/05/2012   PLT 226 04/05/2012      Chemistry      Component Value Date/Time   NA 135 03/15/2012 0926   K 3.9 03/15/2012 0926   CL 101 03/15/2012 0926   CO2 24 03/15/2012 0926   BUN 16 03/15/2012 0926   CREATININE 0.88 03/15/2012 0926      Component Value Date/Time   CALCIUM 8.9 03/15/2012 0926   ALKPHOS 117 03/15/2012 0926   AST 18 03/15/2012 0926   ALT 15 03/15/2012 0926   BILITOT 0.1* 03/15/2012 0926       Lab Results  Component Value Date    LABCA2 28 02/16/2012     STUDIES:  Echo July 3 showed an EF of 60%.     ASSESSMENT: 36 y.o. [place] woman   (1)  status post right breast biopsy 02/09/2012 for multifocal pT4 cN1, stage IIIB invasive ductal carcinoma. Prognostic profile from one of the 2 masses shows it to be triple positive with an MIB-1 of 30%.   (2)  being treated in the neoadjuvant setting, the goal being to complete 4 dose dense cycles of doxorubicin/Adriamycin, followed by 12 dose dense cycles of paclitaxel given along with trastuzumab. The trastuzumab will be continued for total of one year.  (3)  BRCA 1 and 2 negative  PLAN:  Lei continues to tolerate her chemotherapy remarkably well. She return next week on August 21 for followup prior to her fourth and final scheduled dose of doxorubicin/cyclophosphamide. She scheduled for repeat breast MRI on August 26, and we'll see Dr. Darnelle Catalan on August 28 to review those results. At that time there also discuss her upcoming regimen of paclitaxel with trastuzumab.  Deztinee voices understanding and agreement with our plan, noticed called any changes or problems prior to her next appointment.   Kennett Symes    04/05/2012

## 2012-04-05 NOTE — Telephone Encounter (Signed)
Gave patient appointment for 05-11-2012 at 1:00pm injection only appointment

## 2012-04-05 NOTE — Assessment & Plan Note (Addendum)
Role of the cardio-oncology clinic reviewed at length. Baseline echo reviewed in clinic. Given exposure to doxorubicin risk of cardiotoxicity with herceptin may be as high as 30%. Will continue with regular echo surveillance. Knows to call if developing HF symptoms which were reviewed.

## 2012-04-12 ENCOUNTER — Ambulatory Visit (HOSPITAL_BASED_OUTPATIENT_CLINIC_OR_DEPARTMENT_OTHER): Payer: Managed Care, Other (non HMO)

## 2012-04-12 ENCOUNTER — Ambulatory Visit (HOSPITAL_BASED_OUTPATIENT_CLINIC_OR_DEPARTMENT_OTHER): Payer: Managed Care, Other (non HMO) | Admitting: Physician Assistant

## 2012-04-12 ENCOUNTER — Encounter: Payer: Self-pay | Admitting: Physician Assistant

## 2012-04-12 ENCOUNTER — Other Ambulatory Visit (HOSPITAL_BASED_OUTPATIENT_CLINIC_OR_DEPARTMENT_OTHER): Payer: Managed Care, Other (non HMO) | Admitting: Lab

## 2012-04-12 VITALS — BP 124/88 | HR 85 | Temp 97.5°F

## 2012-04-12 DIAGNOSIS — C50419 Malignant neoplasm of upper-outer quadrant of unspecified female breast: Secondary | ICD-10-CM

## 2012-04-12 DIAGNOSIS — Z5111 Encounter for antineoplastic chemotherapy: Secondary | ICD-10-CM

## 2012-04-12 DIAGNOSIS — Z170 Estrogen receptor positive status [ER+]: Secondary | ICD-10-CM

## 2012-04-12 DIAGNOSIS — C50919 Malignant neoplasm of unspecified site of unspecified female breast: Secondary | ICD-10-CM

## 2012-04-12 LAB — COMPREHENSIVE METABOLIC PANEL
ALT: 15 U/L (ref 0–35)
AST: 19 U/L (ref 0–37)
CO2: 27 mEq/L (ref 19–32)
Creatinine, Ser: 0.77 mg/dL (ref 0.50–1.10)
Sodium: 137 mEq/L (ref 135–145)
Total Bilirubin: 0.2 mg/dL — ABNORMAL LOW (ref 0.3–1.2)
Total Protein: 6.5 g/dL (ref 6.0–8.3)

## 2012-04-12 LAB — CBC WITH DIFFERENTIAL/PLATELET
BASO%: 0.7 % (ref 0.0–2.0)
EOS%: 0.2 % (ref 0.0–7.0)
HCT: 33.9 % — ABNORMAL LOW (ref 34.8–46.6)
LYMPH%: 10.4 % — ABNORMAL LOW (ref 14.0–49.7)
MCH: 31 pg (ref 25.1–34.0)
MCHC: 34.1 g/dL (ref 31.5–36.0)
MONO#: 0.5 10*3/uL (ref 0.1–0.9)
NEUT%: 84.1 % — ABNORMAL HIGH (ref 38.4–76.8)
RBC: 3.73 10*6/uL (ref 3.70–5.45)
WBC: 11 10*3/uL — ABNORMAL HIGH (ref 3.9–10.3)
lymph#: 1.1 10*3/uL (ref 0.9–3.3)

## 2012-04-12 MED ORDER — SODIUM CHLORIDE 0.9 % IV SOLN
150.0000 mg | Freq: Once | INTRAVENOUS | Status: AC
  Administered 2012-04-12: 150 mg via INTRAVENOUS
  Filled 2012-04-12: qty 5

## 2012-04-12 MED ORDER — DEXAMETHASONE SODIUM PHOSPHATE 4 MG/ML IJ SOLN
12.0000 mg | Freq: Once | INTRAMUSCULAR | Status: AC
  Administered 2012-04-12: 20 mg via INTRAVENOUS

## 2012-04-12 MED ORDER — SODIUM CHLORIDE 0.9 % IV SOLN
600.0000 mg/m2 | Freq: Once | INTRAVENOUS | Status: AC
  Administered 2012-04-12: 1140 mg via INTRAVENOUS
  Filled 2012-04-12: qty 57

## 2012-04-12 MED ORDER — PALONOSETRON HCL INJECTION 0.25 MG/5ML
0.2500 mg | Freq: Once | INTRAVENOUS | Status: AC
  Administered 2012-04-12: 0.25 mg via INTRAVENOUS

## 2012-04-12 MED ORDER — SODIUM CHLORIDE 0.9 % IV SOLN
Freq: Once | INTRAVENOUS | Status: AC
  Administered 2012-04-12: 10:00:00 via INTRAVENOUS

## 2012-04-12 MED ORDER — DOXORUBICIN HCL CHEMO IV INJECTION 2 MG/ML
60.0000 mg/m2 | Freq: Once | INTRAVENOUS | Status: AC
  Administered 2012-04-12: 114 mg via INTRAVENOUS
  Filled 2012-04-12: qty 57

## 2012-04-12 MED ORDER — SODIUM CHLORIDE 0.9 % IJ SOLN
10.0000 mL | INTRAMUSCULAR | Status: DC | PRN
  Administered 2012-04-12: 10 mL
  Filled 2012-04-12: qty 10

## 2012-04-12 MED ORDER — HEPARIN SOD (PORK) LOCK FLUSH 100 UNIT/ML IV SOLN
500.0000 [IU] | Freq: Once | INTRAVENOUS | Status: AC | PRN
  Administered 2012-04-12: 500 [IU]
  Filled 2012-04-12: qty 5

## 2012-04-12 NOTE — Progress Notes (Signed)
ID: Jody Dunn   DOB: 04/15/1976  MR#: [id number]  CSN#:[id number]  HISTORY OF PRESENT ILLNESS: Jody Dunn had a mammogram at [facility] of 2010 because of shooting pains in the right breast. This found no worrisome finding. More recently, in the spring of 2012, the patient at delivered her second child. She nursed but the child refused to take milk from the right breast. The patient tells me that she has been able to palpate easily from the right breast, so production is not an issue. More recently, early June 2013, the patient felt a new lump in the right breast, and brought this to the attention of Jody Dunn at [facility]. The patient had repeat mammography at [facility] 02/07/2012. This found at the breast tissue to be heterogeneously dense, which is not a surprise given that the patient is lactating. There was diffuse skin thickening. Erythema is not described. The nipple was retracted. There was no definitive peau d'orange appearance. Pleomorphic calcifications in the lateral right breast extended approximately 11 cm, without definite mass. On ultrasound there was a vague irregular hypoechoic mass measuring approximately 15 mm in the right breast, with a second ill-defined hypoechoic lobulated mass measuring approximately 17 mm. Both these masses were biopsied 02/09/2012, and the final pathology ([id number]) showed both IIb invasive ductal carcinoma, with a prognostic panel (from the mass at 11:00) as follows: estrogen receptor positive at 90%, progesterone receptor positive at 90%, Mib-1 of 30%, and HER-2 amplification with a ratio of 4.29 by CISH. MRI of the breast was performed 02/15/2012 and showed the mass in the 10:00 position to measure 3.1 cm, the one at the 11:00 position to measure 3.1 cm. There was diffuse skin thickening in the right breast and 2 enhancing abnormal-appearing lower right axillary lymph nodes measuring 2.0 and 1.8 cm respectively. There was no evidence for internal mammary  adenopathy, left axillary adenopathy, or significant findings in the left breast.  INTERVAL HISTORY: Jody Dunn is seen in the infusion room today while receiving day 1 cycle 4 of 4 planned dose dense cycles of doxorubicin/cyclophosphamide which she is receiving in the neoadjuvant setting. She continues to tolerate chemotherapy remarkably well. She receives acupuncture for several days after each treatment which she finds very helpful, and she denies any signs at all for nausea or emesis.    In fact, interval history is remarkable only for the fact that Jody Dunn's son started kindergarten this past week and is "loving it".    Jody Dunn has met with both Dr. Odis Luster and Dr. Kelly Splinter to discuss possibilities for reconstruction. She is still considering her options.   REVIEW OF SYSTEMS: [name] denies any fevers or chills. She's had no rashes or skin changes.  No mouth ulcers or oral sensitivity. She's had no signs of abnormal bleeding.  She's had no change in bowel habits. No cough or increased shortness of breath, and no chest pain. No current dizziness or change in vision. No unusual myalgias or arthralgias. No peripheral swelling. No peripheral neuropathy. Her energy level is good.  A detailed review of systems is otherwise noncontributory.   PAST MEDICAL HISTORY: Past Medical History  Diagnosis Date  . Iron (Fe) deficiency anemia   . Asthma     exercise induced  . Breast cancer   . Back pain   . Migraines   . Lactose intolerance   . Hyperlipidemia     PAST SURGICAL HISTORY: Past Surgical History  Procedure Date  . Appendectomy   . Shoulder surgery   .  Knee surgery     X2  . Portacath placement 02/21/2012    Procedure: INSERTION PORT-A-CATH;  Surgeon: Almond Lint, MD;  Location: [facility];  Service: General;  Laterality: N/A;  . Skin biopsy 02/21/2012    Procedure: BIOPSY SKIN;  Surgeon: Almond Lint, MD;  Location: [facility];  Service: General;  Laterality: Right;    FAMILY HISTORY Family History    Problem Relation Age of Onset  . Cancer Paternal Aunt 67    Breast cancer (half sibling, related through father)  . Spina bifida Paternal Uncle 0    died around 59-35 months of age  . Cancer Maternal Grandmother 66    colon cancer  . Dementia Maternal Grandfather   . Cancer Paternal Grandmother     breast cancer between 36-42  . Cancer Paternal Grandfather     brain cancer  . Cancer Other     Paternal grandmother's siblings had breast cancer and pancreatic cancer  . Cancer Other 65    maternal great grandmother with breast cancer   the patient's parents are alive, in their early 33s. The patient is an only child. There is a significant family history of cancer including breast, brain, and colon, detailed in our genetic counselors report. BRCA and P. 53 testing is in progress.  GYNECOLOGIC HISTORY: Menarche age 52; she is GX P2, first live birth age 71. Most recent period was within the last month. She has a paraguard IUD in place. She has not had periods for a long time since she has been breast-feeding.  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 Her husband Jody Ramon Dredge "Ed" Jody Dunn. works as a Furniture conservator/restorer for [employer]. Their children are Jody Dunn (2008) and Jody Dunn (2012).   ADVANCED DIRECTIVES: not in place  HEALTH MAINTENANCE: History  Substance Use Topics  . Smoking status: Never Smoker   . Smokeless tobacco: Never Used  . Alcohol Use: 2.5 - 3.5 oz/week    5-7 drink(s) per week     OCCASIONAL     Colonoscopy: no  PAP: 2012  Bone density: no  Lipid panel:  Allergies  Allergen Reactions  . Zofran (Ondansetron Hcl) Other (See Comments)    Migraine headaches  . Antiseptic Products, Misc.   Jody Dunn (Antiseptic Products, Misc.) Rash  . Penicillins Rash    Current Outpatient Prescriptions  Medication Sig Dispense Refill  . dexamethasone (DECADRON) 4 MG tablet Take 8 mg by mouth 2 (two) times daily with a meal.      .  lidocaine-prilocaine (EMLA) cream       . LORazepam (ATIVAN) 0.5 MG tablet       . metoCLOPramide (REGLAN) 10 MG tablet Take 1 tablet (10 mg total) by mouth 4 (four) times daily as needed.  30 tablet  3  . omeprazole (PRILOSEC) 20 MG capsule Take 1 capsule (20 mg total) by mouth daily.  60 capsule  4  . Probiotic Product (PROBIOTIC & ACIDOPHILUS EX ST) CAPS Take 1 capsule by mouth daily.        . promethazine (PHENERGAN) 25 MG tablet Take 25 mg by mouth every 6 (six) hours as needed.      . SUMAtriptan (IMITREX) 50 MG tablet Take 1 tablet (50 mg total) by mouth as directed.  10 tablet  6   No current facility-administered medications for this visit.   Facility-Administered Medications Ordered in Other Visits  Medication Dose Route Frequency Provider Last Rate Last Dose  .  0.9 %  sodium chloride infusion   Intravenous Once Lowella Dell, MD 20 mL/hr at 04/12/12 1020    . cyclophosphamide (CYTOXAN) 1,140 mg in sodium chloride 0.9 % 250 mL chemo infusion  600 mg/m2 (Treatment Plan Actual) Intravenous Once Lowella Dell, MD      . dexamethasone (DECADRON) injection 12 mg  12 mg Intravenous Once Lowella Dell, MD   20 mg at 04/12/12 1048  . DOXOrubicin (ADRIAMYCIN) chemo injection 114 mg  60 mg/m2 (Treatment Plan Actual) Intravenous Once Lowella Dell, MD      . fosaprepitant (EMEND) 150 mg in sodium chloride 0.9 % 145 mL IVPB  150 mg Intravenous Once Lowella Dell, MD 300 mL/hr at 04/12/12 1048 150 mg at 04/12/12 1048  . heparin lock flush 100 unit/mL  500 Units Intracatheter Once PRN Lowella Dell, MD      . palonosetron (ALOXI) injection 0.25 mg  0.25 mg Intravenous Once Lowella Dell, MD   0.25 mg at 04/12/12 1030  . sodium chloride 0.9 % injection 10 mL  10 mL Intracatheter PRN Lowella Dell, MD        OBJECTIVE: Young white woman who is seen while receiving her IV treatment. She appears comfortable and is in in no acute distress. ECOG: 1 Vitals are in medical  records and were reviewed. Physical Exam: HEENT:  Sclerae anicteric.  Oropharynx clear.   Breast Exam:  Deferred Lungs:  Clear to auscultation bilaterally.  No crackles, rhonchi, or wheezes.   Heart:  Regular rate and rhythm.   Abdomen:  Soft, nontender.  Positive bowel sounds.   Extremities:  Benign.  No peripheral edema or cyanosis.   Skin:  Benign.   Neuro:  Nonfocal.  Alert and oriented x3.    LAB RESULTS: Lab Results  Component Value Date   WBC 11.0* 04/12/2012   NEUTROABS 9.2* 04/12/2012   HGB 11.6 04/12/2012   HCT 33.9* 04/12/2012   MCV 90.9 04/12/2012   PLT 263 04/12/2012      Chemistry      Component Value Date/Time   NA 137 04/12/2012 0913   K 3.7 04/12/2012 0913   CL 102 04/12/2012 0913   CO2 27 04/12/2012 0913   BUN 8 04/12/2012 0913   CREATININE 0.77 04/12/2012 0913      Component Value Date/Time   CALCIUM 9.1 04/12/2012 0913   ALKPHOS 122* 04/12/2012 0913   AST 19 04/12/2012 0913   ALT 15 04/12/2012 0913   BILITOT 0.2* 04/12/2012 0913       Lab Results  Component Value Date   LABCA2 28 02/16/2012     STUDIES:  Echo July 3 showed an EF of 60%.     ASSESSMENT: 36 y.o.  woman   (1)  status post right breast biopsy 02/09/2012 for multifocal pT4 cN1, stage IIIB invasive ductal carcinoma. Prognostic profile from one of the 2 masses shows it to be triple positive with an MIB-1 of 30%.   (2)  being treated in the neoadjuvant setting, the goal being to complete 4 dose dense cycles of doxorubicin/cyclophosphamide, followed by 12 dose dense cycles of paclitaxel given along with trastuzumab. The trastuzumab will be continued for total of one year.  (3)  BRCA 1 and 2 negative  PLAN:  Dinna will continue with her fourth and final dose of  Doxorubicin/cyclophosphamide today. She will receive Neulasta tomorrow. She's Re: scheduled for repeat breast MRI next week andon August 26, and will see Dr.  Magrinat on August 28 to review those results. At that time they  will also discuss her upcoming regimen of paclitaxel with trastuzumab.  Sharada voices understanding and agreement with our plan, noticed called any changes or problems prior to her next appointment.   Toneka Fullen    04/12/2012

## 2012-04-12 NOTE — Patient Instructions (Addendum)
[facility] Discharge Instructions for Patients Receiving Chemotherapy  Today you received the following chemotherapy agents :  Adriamycin,  Cytoxan.  To help prevent nausea and vomiting after your treatment, we encourage you to take your nausea medication as instructed by your physician, and take meds as needed for nausea.    If you develop nausea and vomiting that is not controlled by your nausea medication, call the clinic. If it is after clinic hours your family physician or the after hours number for the clinic or go to the Emergency Department.   BELOW ARE SYMPTOMS THAT SHOULD BE REPORTED IMMEDIATELY:  *FEVER GREATER THAN 100.5 F  *CHILLS WITH OR WITHOUT FEVER  NAUSEA AND VOMITING THAT IS NOT CONTROLLED WITH YOUR NAUSEA MEDICATION  *UNUSUAL SHORTNESS OF BREATH  *UNUSUAL BRUISING OR BLEEDING  TENDERNESS IN MOUTH AND THROAT WITH OR WITHOUT PRESENCE OF ULCERS  *URINARY PROBLEMS  *BOWEL PROBLEMS  UNUSUAL RASH Items with * indicate a potential emergency and should be followed up as soon as possible.  One of the nurses will contact you 24 hours after your treatment. Please let the nurse know about any problems that you may have experienced. Feel free to call the clinic you have any questions or concerns. The clinic phone number is [phone].   I have been informed and understand all the instructions given to me. I know to contact the clinic, my physician, or go to the Emergency Department if any problems should occur. I do not have any questions at this time, but understand that I may call the clinic during office hours   should I have any questions or need assistance in obtaining follow up care.    __________________________________________  _____________  __________ Signature of Patient or Authorized Representative            Date                   Time    __________________________________________ Nurse's Signature

## 2012-04-13 ENCOUNTER — Ambulatory Visit (HOSPITAL_BASED_OUTPATIENT_CLINIC_OR_DEPARTMENT_OTHER): Payer: Managed Care, Other (non HMO)

## 2012-04-13 VITALS — BP 121/78 | HR 75 | Temp 98.2°F | Resp 16

## 2012-04-13 DIAGNOSIS — C50919 Malignant neoplasm of unspecified site of unspecified female breast: Secondary | ICD-10-CM

## 2012-04-13 DIAGNOSIS — Z5189 Encounter for other specified aftercare: Secondary | ICD-10-CM

## 2012-04-13 DIAGNOSIS — C50419 Malignant neoplasm of upper-outer quadrant of unspecified female breast: Secondary | ICD-10-CM

## 2012-04-13 MED ORDER — PEGFILGRASTIM INJECTION 6 MG/0.6ML
6.0000 mg | Freq: Once | SUBCUTANEOUS | Status: AC
  Administered 2012-04-13: 6 mg via SUBCUTANEOUS
  Filled 2012-04-13: qty 0.6

## 2012-04-17 ENCOUNTER — Ambulatory Visit (HOSPITAL_COMMUNITY)
Admission: RE | Admit: 2012-04-17 | Discharge: 2012-04-17 | Disposition: A | Discharge status: Home / Self Care | Payer: Managed Care, Other (non HMO) | Source: Ambulatory Visit | Attending: Physician Assistant | Admitting: Physician Assistant

## 2012-04-17 DIAGNOSIS — C50919 Malignant neoplasm of unspecified site of unspecified female breast: Secondary | ICD-10-CM | POA: Insufficient documentation

## 2012-04-17 DIAGNOSIS — C50419 Malignant neoplasm of upper-outer quadrant of unspecified female breast: Secondary | ICD-10-CM

## 2012-04-17 MED ORDER — GADOBENATE DIMEGLUMINE 529 MG/ML IV SOLN
16.0000 mL | Freq: Once | INTRAVENOUS | Status: AC | PRN
  Administered 2012-04-17: 16 mL via INTRAVENOUS

## 2012-04-19 ENCOUNTER — Other Ambulatory Visit (HOSPITAL_BASED_OUTPATIENT_CLINIC_OR_DEPARTMENT_OTHER): Payer: Managed Care, Other (non HMO)

## 2012-04-19 ENCOUNTER — Ambulatory Visit (HOSPITAL_BASED_OUTPATIENT_CLINIC_OR_DEPARTMENT_OTHER): Payer: Managed Care, Other (non HMO) | Admitting: Oncology

## 2012-04-19 ENCOUNTER — Telehealth: Payer: Self-pay | Admitting: *Deleted

## 2012-04-19 VITALS — BP 113/80 | HR 89 | Temp 98.7°F | Resp 20 | Ht 68.0 in | Wt 171.2 lb

## 2012-04-19 DIAGNOSIS — C50419 Malignant neoplasm of upper-outer quadrant of unspecified female breast: Secondary | ICD-10-CM

## 2012-04-19 DIAGNOSIS — Z170 Estrogen receptor positive status [ER+]: Secondary | ICD-10-CM

## 2012-04-19 LAB — CBC WITH DIFFERENTIAL/PLATELET
BASO%: 0.7 % (ref 0.0–2.0)
EOS%: 0.4 % (ref 0.0–7.0)
HCT: 30.9 % — ABNORMAL LOW (ref 34.8–46.6)
LYMPH%: 15.3 % (ref 14.0–49.7)
MCH: 31.3 pg (ref 25.1–34.0)
MCHC: 34.5 g/dL (ref 31.5–36.0)
MCV: 90.9 fL (ref 79.5–101.0)
MONO%: 6.4 % (ref 0.0–14.0)
NEUT%: 77.2 % — ABNORMAL HIGH (ref 38.4–76.8)
Platelets: 205 10*3/uL (ref 145–400)
RBC: 3.4 10*6/uL — ABNORMAL LOW (ref 3.70–5.45)

## 2012-04-19 NOTE — Progress Notes (Signed)
ID: Jody Dunn   DOB: 06/28/1976  MR#: [id number]  CSN#:[id number]  HISTORY OF PRESENT ILLNESS: Jody Dunn had a mammogram at [facility] of 2010 because of shooting pains in the right breast. This found no worrisome finding. More recently, in the spring of 2012, the patient at delivered her second child. She nursed but the child refused to take milk from the right breast. The patient tells me that she has been able to palpate easily from the right breast, so production is not an issue. More recently, early June 2013, the patient felt a new lump in the right breast, and brought this to the attention of [name] at [facility]. The patient had repeat mammography at [facility] 02/07/2012. This found at the breast tissue to be heterogeneously dense, which is not a surprise given that the patient is lactating. There was diffuse skin thickening. Erythema is not described. The nipple was retracted. There was no definitive peau d'orange appearance. Pleomorphic calcifications in the lateral right breast extended approximately 11 cm, without definite mass. On ultrasound there was a vague irregular hypoechoic mass measuring approximately 15 mm in the right breast, with a second ill-defined hypoechoic lobulated mass measuring approximately 17 mm. Both these masses were biopsied 02/09/2012, and the final pathology ([id number]) showed both IIb invasive ductal carcinoma, with a prognostic panel (from the mass at 11:00) as follows: estrogen receptor positive at 90%, progesterone receptor positive at 90%, Mib-1 of 30%, and HER-2 amplification with a ratio of 4.29 by CISH. MRI of the breast was performed 02/15/2012 and showed the mass in the 10:00 position to measure 3.1 cm, the one at the 11:00 position to measure 3.1 cm. There was diffuse skin thickening in the right breast and 2 enhancing abnormal-appearing lower right axillary lymph nodes measuring 2.0 and 1.8 cm respectively. There was no evidence for internal mammary  adenopathy, left axillary adenopathy, or significant findings in the left breast.  INTERVAL HISTORY: Jody Dunn returns today with her baby daughter for followup of her breast cancer. She has now completed the first half of her 8 chemotherapy cycles. We obtained a restaging breast MRI, discussed below.   REVIEW OF SYSTEMS: Generally she tolerated treatment remarkably well. Of course she lost her hair, and has had some fatigue. She has not had any significant nausea problems, and she hasn't even been taking the full of antinausea prescriptions. She gets acupuncture and moxybustion after each treatment, and that helps her a lot. She has had a feeling that food can't go down, sometimes, and this tends to occur one or 2 days after chemotherapy. This has happened in her remote past, when it was attributed to a hiatal hernia. She has had no fevers, rash, or mucositis, and she has had no symptoms suggestive of congestive heart failure. She is having some hot flashes but after many years without a periods she had a period July 10 and now is again menstruating. A detailed review of systems is otherwise negative.  PAST MEDICAL HISTORY: Past Medical History  Diagnosis Date  . Iron (Fe) deficiency anemia   . Asthma     exercise induced  . Breast cancer   . Back pain   . Migraines   . Lactose intolerance   . Hyperlipidemia     PAST SURGICAL HISTORY: Past Surgical History  Procedure Date  . Appendectomy   . Shoulder surgery   . Knee surgery     X2  . Portacath placement 02/21/2012    Procedure: INSERTION PORT-A-CATH;  Surgeon: Almond Lint, MD;  Location: [facility];  Service: General;  Laterality: N/A;  . Skin biopsy 02/21/2012    Procedure: BIOPSY SKIN;  Surgeon: Almond Lint, MD;  Location: [facility];  Service: General;  Laterality: Right;    FAMILY HISTORY Family History  Problem Relation Age of Onset  . Cancer Paternal Aunt 65    Breast cancer (half sibling, related through father)  . Spina bifida  Paternal Uncle 0    died around 73-65 months of age  . Cancer Maternal Grandmother 41    colon cancer  . Dementia Maternal Grandfather   . Cancer Paternal Grandmother     breast cancer between 11-42  . Cancer Paternal Grandfather     brain cancer  . Cancer Other     Paternal grandmother's siblings had breast cancer and pancreatic cancer  . Cancer Other 23    maternal great grandmother with breast cancer   the patient's parents are alive, in their early 48s. The patient is an only child. There is a significant family history of cancer including breast, brain, and colon, detailed in our genetic counselors report. BRCA and P. 53 testing is negative  GYNECOLOGIC HISTORY: Menarche age 36; she is GX P2, first live birth age 28.  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 works as a Furniture conservator/restorer for [employer]. Their children are [name] (2008) and [name] (2012).   ADVANCED DIRECTIVES: not in place  HEALTH MAINTENANCE: History  Substance Use Topics  . Smoking status: Never Smoker   . Smokeless tobacco: Never Used  . Alcohol Use: 2.5 - 3.5 oz/week    5-7 drink(s) per week     OCCASIONAL     Colonoscopy: no  PAP: 2012  Bone density: no  Lipid panel:  Allergies  Allergen Reactions  . Zofran (Ondansetron Hcl) Other (See Comments)    Migraine headaches  . Antiseptic Products, Misc.   [name] (Antiseptic Products, Misc.) Rash  . Penicillins Rash    Current Outpatient Prescriptions  Medication Sig Dispense Refill  . dexamethasone (DECADRON) 4 MG tablet Take 8 mg by mouth 2 (two) times daily with a meal.      . lidocaine-prilocaine (EMLA) cream       . LORazepam (ATIVAN) 0.5 MG tablet       . metoCLOPramide (REGLAN) 10 MG tablet Take 1 tablet (10 mg total) by mouth 4 (four) times daily as  needed.  30 tablet  3  . omeprazole (PRILOSEC) 20 MG capsule Take 1 capsule (20 mg total) by mouth daily.  60 capsule  4  . Probiotic Product (PROBIOTIC & ACIDOPHILUS EX ST) CAPS Take 1 capsule by mouth daily.        . promethazine (PHENERGAN) 25 MG tablet Take 25 mg by mouth every 6 (six) hours as needed.      . SUMAtriptan (IMITREX) 50 MG tablet Take 1 tablet (50 mg total) by mouth as directed.  10 tablet  6    OBJECTIVE: Young white woman in no acute distress. ECOG: 1 Filed Vitals:   04/19/12 1107  BP: 113/80  Pulse: 89  Temp: 98.7 F (37.1 C)  Resp: 20    HEENT:  Sclerae anicteric.  Oropharynx clear.   Breast Exam:  I cannot palpate a discrete mass in the right breast.  The right axilla is clear. The left breast is unremarkable. Lungs:  Clear to auscultation bilaterally.  No crackles, rhonchi, or wheezes.   Heart:  Regular rate and rhythm.   Abdomen:  Soft, nontender.  Positive bowel sounds.   Extremities:  No peripheral edema   Neuro:  Nonfocal.  Alert and oriented x3.    LAB RESULTS: Lab Results  Component Value Date   WBC 7.3 04/19/2012   NEUTROABS 5.6 04/19/2012   HGB 10.6* 04/19/2012   HCT 30.9* 04/19/2012   MCV 90.9 04/19/2012   PLT 205 04/19/2012      Chemistry      Component Value Date/Time   NA 137 04/12/2012 0913   K 3.7 04/12/2012 0913   CL 102 04/12/2012 0913   CO2 27 04/12/2012 0913   BUN 8 04/12/2012 0913   CREATININE 0.77 04/12/2012 0913      Component Value Date/Time   CALCIUM 9.1 04/12/2012 0913   ALKPHOS 122* 04/12/2012 0913   AST 19 04/12/2012 0913   ALT 15 04/12/2012 0913   BILITOT 0.2* 04/12/2012 0913       Lab Results  Component Value Date   LABCA2 28 02/16/2012     STUDIES:  Mr Breast Bilateral W Wo Contrast  04/18/2012  *RADIOLOGY REPORT*  Clinical Data: Assess response to neoadjuvant chemotherapy for right breast cancer.  BILATERAL BREAST MRI WITH AND WITHOUT CONTRAST  Technique: Multiplanar, multisequence MR images of both breasts were  obtained prior to and following the intravenous administration of 16ml of MultiHance.  Three dimensional images were evaluated at the independent DynaCad workstation.  Comparison:  02/15/2012.  Findings: Significant reduction in background parenchymal enhancement in both breasts.  There is minimal background parenchymal enhancement in the left breast and mild background parenchymal enhancement in the right breast.  The previously seen biopsy proven malignant enhancing mass in the anterior aspect of the upper right breast, slightly medially, is no longer seen as a discrete enhancing mass.  The previously seen biopsy proven malignant enhancing mass in the more posterior upper outer right breast is no longer seen as a discrete mass.  Currently, in that area, there is some irregular, patchy, non mass enhancement in that region, measuring 3.3 x 3.2 x 1.1 cm in maximum dimensions.  This has predominately persistent enhancement kinetics with a small amount of plateau and rapid washin/washout kinetics.  There are additional small areas of nodular, mass-like enhancement, linear enhancement and patchy enhancement elsewhere throughout the right breast in the areas of previously demonstrated dense parenchymal enhancement.  These are asymmetrical compared to the left breast.  The previously seen abnormal right breast skin thickening is no longer demonstrated.  No masses or areas of enhancement suspicious for malignancy in the left breast.  The previously seen enlarged right axillary lymph nodes are no longer enlarged.  Interval post chemotherapy edema and enhancement within the sternum.  IMPRESSION:  1.  Significantly improved appearance of the right breast with scattered areas of residual enhancement throughout the breast, most likely representing mild residual malignancy, as described above. 2.  Resolved right axillary metastatic adenopathy.  3.  No evidence of malignancy in the left breast.  RECOMMENDATION: Treatment plan   THREE-DIMENSIONAL MR IMAGE RENDERING ON INDEPENDENT WORKSTATION:  Three-dimensional MR images were rendered by post-processing of the original MR data on an independent workstation.  The three- dimensional MR images were interpreted, and findings were reported in the accompanying complete MRI report for this study.  BI-RADS CATEGORY 6:  Known biopsy-proven  malignancy - appropriate action should be taken.   Original Report Authenticated By: Darrol Angel, M.D.      ASSESSMENT: 36 y.o. BRCA-negative [place] woman   (1)  status post right breast biopsy 02/09/2012 for multifocal pT4 cN1, stage IIIB invasive ductal carcinoma. Prognostic profile from one of the 2 masses shows it to be triple positive with an MIB-1 of 30%.   (2)  s/p 4 dose dense cycles of doxorubicin/ cyclophosphamide, to be followed by 4 cycles of paclitaxel, dose-dense, given with trastuzumab    PLAN:  She is a ready having a good response to treatment, and we haven't yet started the trastuzumab. This is very encouraging. Today we went over the possible toxicities side effects and complications of the paclitaxel and trastuzumab. I would prefer to have an echocardiogram before her first treatment, but if that cannot be arranged we will go ahead and give her the first treatment of Taxol and hold the trastuzumab until later. I suggested she take metoclopramide 3 times a day for 2 days after each treatment, which should help her swallowing issues. Otherwise her plan is for bilateral mastectomies, with immediate left reconstruction and delayed right reconstruction. She knows to call for any problems that may develop before the next visit.   Magdiel Bartles C    04/19/2012

## 2012-04-19 NOTE — Telephone Encounter (Signed)
Patient confirmed over the phone the new date and time on 04-21-2012 for the echo appointment

## 2012-04-21 ENCOUNTER — Ambulatory Visit (HOSPITAL_COMMUNITY)
Admission: RE | Admit: 2012-04-21 | Discharge: 2012-04-21 | Disposition: A | Discharge status: Home / Self Care | Payer: Managed Care, Other (non HMO) | Source: Ambulatory Visit | Attending: Oncology | Admitting: Oncology

## 2012-04-21 DIAGNOSIS — C50419 Malignant neoplasm of upper-outer quadrant of unspecified female breast: Secondary | ICD-10-CM | POA: Insufficient documentation

## 2012-04-21 DIAGNOSIS — Z09 Encounter for follow-up examination after completed treatment for conditions other than malignant neoplasm: Secondary | ICD-10-CM

## 2012-04-21 NOTE — Progress Notes (Signed)
  Echocardiogram 2D Echocardiogram has been performed.  Jody Dunn 04/21/2012, 11:52 AM

## 2012-04-26 ENCOUNTER — Encounter: Payer: Self-pay | Admitting: Physician Assistant

## 2012-04-26 ENCOUNTER — Other Ambulatory Visit (HOSPITAL_BASED_OUTPATIENT_CLINIC_OR_DEPARTMENT_OTHER): Payer: Managed Care, Other (non HMO) | Admitting: Lab

## 2012-04-26 ENCOUNTER — Ambulatory Visit (HOSPITAL_BASED_OUTPATIENT_CLINIC_OR_DEPARTMENT_OTHER): Payer: Managed Care, Other (non HMO) | Admitting: Physician Assistant

## 2012-04-26 ENCOUNTER — Ambulatory Visit (HOSPITAL_BASED_OUTPATIENT_CLINIC_OR_DEPARTMENT_OTHER): Payer: Managed Care, Other (non HMO)

## 2012-04-26 VITALS — BP 124/87 | HR 80 | Temp 97.5°F | Resp 20 | Ht 68.0 in | Wt 169.6 lb

## 2012-04-26 VITALS — BP 122/80 | HR 86 | Temp 98.2°F | Resp 18

## 2012-04-26 DIAGNOSIS — Z5111 Encounter for antineoplastic chemotherapy: Secondary | ICD-10-CM

## 2012-04-26 DIAGNOSIS — C50419 Malignant neoplasm of upper-outer quadrant of unspecified female breast: Secondary | ICD-10-CM

## 2012-04-26 DIAGNOSIS — Z5112 Encounter for antineoplastic immunotherapy: Secondary | ICD-10-CM

## 2012-04-26 DIAGNOSIS — Z170 Estrogen receptor positive status [ER+]: Secondary | ICD-10-CM

## 2012-04-26 LAB — CBC WITH DIFFERENTIAL/PLATELET
BASO%: 0.4 % (ref 0.0–2.0)
EOS%: 0.1 % (ref 0.0–7.0)
HCT: 34.3 % — ABNORMAL LOW (ref 34.8–46.6)
MCH: 30.5 pg (ref 25.1–34.0)
MCHC: 33.5 g/dL (ref 31.5–36.0)
MONO#: 0.3 10*3/uL (ref 0.1–0.9)
NEUT%: 84.5 % — ABNORMAL HIGH (ref 38.4–76.8)
RBC: 3.77 10*6/uL (ref 3.70–5.45)
RDW: 16.4 % — ABNORMAL HIGH (ref 11.2–14.5)
WBC: 7.4 10*3/uL (ref 3.9–10.3)
lymph#: 0.8 10*3/uL — ABNORMAL LOW (ref 0.9–3.3)

## 2012-04-26 LAB — COMPREHENSIVE METABOLIC PANEL (CC13)
ALT: 23 U/L (ref 0–55)
AST: 41 U/L — ABNORMAL HIGH (ref 5–34)
Calcium: 8.6 mg/dL (ref 8.4–10.4)
Chloride: 101 mEq/L (ref 98–107)
Creatinine: 0.9 mg/dL (ref 0.6–1.1)
Sodium: 138 mEq/L (ref 136–145)
Total Protein: 7.1 g/dL (ref 6.4–8.3)

## 2012-04-26 MED ORDER — ACETAMINOPHEN 325 MG PO TABS
650.0000 mg | ORAL_TABLET | Freq: Once | ORAL | Status: AC
  Administered 2012-04-26: 650 mg via ORAL

## 2012-04-26 MED ORDER — PALONOSETRON HCL INJECTION 0.25 MG/5ML
0.2500 mg | Freq: Once | INTRAVENOUS | Status: AC
  Administered 2012-04-26: 0.25 mg via INTRAVENOUS

## 2012-04-26 MED ORDER — SODIUM CHLORIDE 0.9 % IJ SOLN
10.0000 mL | INTRAMUSCULAR | Status: DC | PRN
  Administered 2012-04-26: 10 mL
  Filled 2012-04-26: qty 10

## 2012-04-26 MED ORDER — HEPARIN SOD (PORK) LOCK FLUSH 100 UNIT/ML IV SOLN
500.0000 [IU] | Freq: Once | INTRAVENOUS | Status: AC | PRN
  Administered 2012-04-26: 500 [IU]
  Filled 2012-04-26: qty 5

## 2012-04-26 MED ORDER — FAMOTIDINE IN NACL 20-0.9 MG/50ML-% IV SOLN
20.0000 mg | Freq: Once | INTRAVENOUS | Status: AC
  Administered 2012-04-26: 20 mg via INTRAVENOUS

## 2012-04-26 MED ORDER — SODIUM CHLORIDE 0.9 % IV SOLN
Freq: Once | INTRAVENOUS | Status: AC
  Administered 2012-04-26: 13:00:00 via INTRAVENOUS

## 2012-04-26 MED ORDER — PACLITAXEL CHEMO INJECTION 300 MG/50ML
175.0000 mg/m2 | Freq: Once | INTRAVENOUS | Status: AC
  Administered 2012-04-26: 336 mg via INTRAVENOUS
  Filled 2012-04-26: qty 56

## 2012-04-26 MED ORDER — DIPHENHYDRAMINE HCL 50 MG/ML IJ SOLN
25.0000 mg | Freq: Once | INTRAMUSCULAR | Status: AC
  Administered 2012-04-26: 25 mg via INTRAVENOUS

## 2012-04-26 MED ORDER — DEXAMETHASONE SODIUM PHOSPHATE 4 MG/ML IJ SOLN
20.0000 mg | Freq: Once | INTRAMUSCULAR | Status: AC
  Administered 2012-04-26: 20 mg via INTRAVENOUS

## 2012-04-26 MED ORDER — TRASTUZUMAB CHEMO INJECTION 440 MG
4.0000 mg/kg | Freq: Once | INTRAVENOUS | Status: AC
  Administered 2012-04-26: 315 mg via INTRAVENOUS
  Filled 2012-04-26: qty 15

## 2012-04-26 NOTE — Progress Notes (Signed)
Treatment Plan Dated 04/26/2012 

## 2012-04-26 NOTE — Patient Instructions (Addendum)
[facility] Discharge Instructions for Patients Receiving Chemotherapy  Today you received the following chemotherapy agents Taxol and Herceptin  To help prevent nausea and vomiting after your treatment, we encourage you to take your nausea medication  Begin taking it at 7 pm and take it as often as prescribed for the next 24 to 72 hours.   If you develop nausea and vomiting that is not controlled by your nausea medication, call the clinic. If it is after clinic hours your family physician or the after hours number for the clinic or go to the Emergency Department.   BELOW ARE SYMPTOMS THAT SHOULD BE REPORTED IMMEDIATELY:  *FEVER GREATER THAN 100.5 F  *CHILLS WITH OR WITHOUT FEVER  NAUSEA AND VOMITING THAT IS NOT CONTROLLED WITH YOUR NAUSEA MEDICATION  *UNUSUAL SHORTNESS OF BREATH  *UNUSUAL BRUISING OR BLEEDING  TENDERNESS IN MOUTH AND THROAT WITH OR WITHOUT PRESENCE OF ULCERS  *URINARY PROBLEMS  *BOWEL PROBLEMS  UNUSUAL RASH Items with * indicate a potential emergency and should be followed up as soon as possible.  One of the nurses will contact you 24 hours after your treatment. Please let the nurse know about any problems that you may have experienced. Feel free to call the clinic you have any questions or concerns. The clinic phone number is [phone].   I have been informed and understand all the instructions given to me. I know to contact the clinic, my physician, or go to the Emergency Department if any problems should occur. I do not have any questions at this time, but understand that I may call the clinic during office hours   should I have any questions or need assistance in obtaining follow up care.    __________________________________________  _____________  __________ Signature of Patient or Authorized Representative            Date                   Time    __________________________________________ Nurse's Signature  Paclitaxel  injection What is this medicine? PACLITAXEL (PAK li TAX el) is a chemotherapy drug. It targets fast dividing cells, like cancer cells, and causes these cells to die. This medicine is used to treat ovarian cancer, breast cancer, and other cancers. This medicine may be used for other purposes; ask your health care provider or pharmacist if you have questions. What should I tell my health care provider before I take this medicine? They need to know if you have any of these conditions: -blood disorders -irregular heartbeat -infection (especially a virus infection such as chickenpox, cold sores, or herpes) -liver disease -previous or ongoing radiation therapy -an unusual or allergic reaction to paclitaxel, alcohol, polyoxyethylated castor oil, other chemotherapy agents, other medicines, foods, dyes, or preservatives -pregnant or trying to get pregnant -breast-feeding How should I use this medicine? This drug is given as an infusion into a vein. It is administered in a hospital or clinic by a specially trained health care professional. Talk to your pediatrician regarding the use of this medicine in children. Special care may be needed. Overdosage: If you think you have taken too much of this medicine contact a poison control center or emergency room at once. NOTE: This medicine is only for you. Do not share this medicine with others. What if I miss a dose? It is important not to miss your dose. Call your doctor or health care professional if you are unable to keep an appointment. What may interact  with this medicine? Do not take this medicine with any of the following medications: -disulfiram -metronidazole This medicine may also interact with the following medications: -cyclosporine -dexamethasone -diazepam -ketoconazole -medicines to increase blood counts like filgrastim, pegfilgrastim, sargramostim -other chemotherapy drugs like cisplatin, doxorubicin, epirubicin, etoposide, teniposide,  vincristine -quinidine -testosterone -vaccines -verapamil Talk to your doctor or health care professional before taking any of these medicines: -acetaminophen -aspirin -ibuprofen -ketoprofen -naproxen This list may not describe all possible interactions. Give your health care provider a list of all the medicines, herbs, non-prescription drugs, or dietary supplements you use. Also tell them if you smoke, drink alcohol, or use illegal drugs. Some items may interact with your medicine. What should I watch for while using this medicine? Your condition will be monitored carefully while you are receiving this medicine. You will need important blood work done while you are taking this medicine. This drug may make you feel generally unwell. This is not uncommon, as chemotherapy can affect healthy cells as well as cancer cells. Report any side effects. Continue your course of treatment even though you feel ill unless your doctor tells you to stop. In some cases, you may be given additional medicines to help with side effects. Follow all directions for their use. Call your doctor or health care professional for advice if you get a fever, chills or sore throat, or other symptoms of a cold or flu. Do not treat yourself. This drug decreases your body's ability to fight infections. Try to avoid being around people who are sick. This medicine may increase your risk to bruise or bleed. Call your doctor or health care professional if you notice any unusual bleeding. Be careful brushing and flossing your teeth or using a toothpick because you may get an infection or bleed more easily. If you have any dental work done, tell your dentist you are receiving this medicine. Avoid taking products that contain aspirin, acetaminophen, ibuprofen, naproxen, or ketoprofen unless instructed by your doctor. These medicines may hide a fever. Do not become pregnant while taking this medicine. Women should inform their doctor if  they wish to become pregnant or think they might be pregnant. There is a potential for serious side effects to an unborn child. Talk to your health care professional or pharmacist for more information. Do not breast-feed an infant while taking this medicine. Men are advised not to father a child while receiving this medicine. What side effects may I notice from receiving this medicine? Side effects that you should report to your doctor or health care professional as soon as possible: -allergic reactions like skin rash, itching or hives, swelling of the face, lips, or tongue -low blood counts - This drug may decrease the number of white blood cells, red blood cells and platelets. You may be at increased risk for infections and bleeding. -signs of infection - fever or chills, cough, sore throat, pain or difficulty passing urine -signs of decreased platelets or bleeding - bruising, pinpoint red spots on the skin, black, tarry stools, nosebleeds -signs of decreased red blood cells - unusually weak or tired, fainting spells, lightheadedness -breathing problems -chest pain -high or low blood pressure -mouth sores -nausea and vomiting -pain, swelling, redness or irritation at the injection site -pain, tingling, numbness in the hands or feet -slow or irregular heartbeat -swelling of the ankle, feet, hands Side effects that usually do not require medical attention (report to your doctor or health care professional if they continue or are bothersome): -bone pain -complete  hair loss including hair on your head, underarms, pubic hair, eyebrows, and eyelashes -changes in the color of fingernails -diarrhea -loosening of the fingernails -loss of appetite -muscle or joint pain -red flush to skin -sweating This list may not describe all possible side effects. Call your doctor for medical advice about side effects. You may report side effects to FDA at 1-800-FDA-1088. Where should I keep my  medicine? This drug is given in a hospital or clinic and will not be stored at home. NOTE: This sheet is a summary. It may not cover all possible information. If you have questions about this medicine, talk to your doctor, pharmacist, or health care provider.  2012, [employer]. (07/22/2008 11:54:26 AM)   Trastuzumab injection for infusion What is this medicine? TRASTUZUMAB (tras TOO zoo mab) is a monoclonal antibody. It targets a protein called HER2. This protein is found in some stomach and breast cancers. This medicine can stop cancer cell growth. This medicine may be used with other cancer treatments. This medicine may be used for other purposes; ask your health care provider or pharmacist if you have questions. What should I tell my health care provider before I take this medicine? They need to know if you have any of these conditions: -heart disease -heart failure -infection (especially a virus infection such as chickenpox, cold sores, or herpes) -lung or breathing disease, like asthma -recent or ongoing radiation therapy -an unusual or allergic reaction to trastuzumab, benzyl alcohol, or other medications, foods, dyes, or preservatives -pregnant or trying to get pregnant -breast-feeding How should I use this medicine? This drug is given as an infusion into a vein. It is administered in a hospital or clinic by a specially trained health care professional. Talk to your pediatrician regarding the use of this medicine in children. This medicine is not approved for use in children. Overdosage: If you think you have taken too much of this medicine contact a poison control center or emergency room at once. NOTE: This medicine is only for you. Do not share this medicine with others. What if I miss a dose? It is important not to miss a dose. Call your doctor or health care professional if you are unable to keep an appointment. What may interact with this  medicine? -cyclophosphamide -doxorubicin -warfarin This list may not describe all possible interactions. Give your health care provider a list of all the medicines, herbs, non-prescription drugs, or dietary supplements you use. Also tell them if you smoke, drink alcohol, or use illegal drugs. Some items may interact with your medicine. What should I watch for while using this medicine? Visit your doctor for checks on your progress. Report any side effects. Continue your course of treatment even though you feel ill unless your doctor tells you to stop. Call your doctor or health care professional for advice if you get a fever, chills or sore throat, or other symptoms of a cold or flu. Do not treat yourself. Try to avoid being around people who are sick. You may experience fever, chills and shaking during your first infusion. These effects are usually mild and can be treated with other medicines. Report any side effects during the infusion to your health care professional. Fever and chills usually do not happen with later infusions. What side effects may I notice from receiving this medicine? Side effects that you should report to your doctor or other health care professional as soon as possible: -breathing difficulties -chest pain or palpitations -cough -dizziness or fainting -fever  or chills, sore throat -skin rash, itching or hives -swelling of the legs or ankles -unusually weak or tired Side effects that usually do not require medical attention (report to your doctor or other health care professional if they continue or are bothersome): -loss of appetite -headache -muscle aches -nausea This list may not describe all possible side effects. Call your doctor for medical advice about side effects. You may report side effects to FDA at 1-800-FDA-1088. Where should I keep my medicine? This drug is given in a hospital or clinic and will not be stored at home. NOTE: This sheet is a summary. It  may not cover all possible information. If you have questions about this medicine, talk to your doctor, pharmacist, or health care provider.  2012, [employer]. (06/13/2009 1:43:15 PM)

## 2012-04-26 NOTE — Progress Notes (Signed)
ID: Jody Dunn   DOB: 1975-09-26  MR#: [id number]  WUJ#:[id number]  HISTORY OF PRESENT ILLNESS: Jody Dunn had a mammogram at [facility] of 2010 because of shooting pains in the right breast. This found no worrisome finding. More recently, in the spring of 2012, the patient at delivered her second child. She nursed but the child refused to take milk from the right breast. The patient tells me that she has been able to palpate easily from the right breast, so production is not an issue. More recently, early June 2013, the patient felt a new lump in the right breast, and brought this to the attention of [name] at [facility]. The patient had repeat mammography at [facility] 02/07/2012. This found at the breast tissue to be heterogeneously dense, which is not a surprise given that the patient is lactating. There was diffuse skin thickening. Erythema is not described. The nipple was retracted. There was no definitive peau d'orange appearance. Pleomorphic calcifications in the lateral right breast extended approximately 11 cm, without definite mass. On ultrasound there was a vague irregular hypoechoic mass measuring approximately 15 mm in the right breast, with a second ill-defined hypoechoic lobulated mass measuring approximately 17 mm. Both these masses were biopsied 02/09/2012, and the final pathology ([id number]) showed both IIb invasive ductal carcinoma, with a prognostic panel (from the mass at 11:00) as follows: estrogen receptor positive at 90%, progesterone receptor positive at 90%, Mib-1 of 30%, and HER-2 amplification with a ratio of 4.29 by CISH. MRI of the breast was performed 02/15/2012 and showed the mass in the 10:00 position to measure 3.1 cm, the one at the 11:00 position to measure 3.1 cm. There was diffuse skin thickening in the right breast and 2 enhancing abnormal-appearing lower right axillary lymph nodes measuring 2.0 and 1.8 cm respectively. There was no evidence for internal mammary  adenopathy, left axillary adenopathy, or significant findings in the left breast.  INTERVAL HISTORY: Jody Dunn returns today accompanied by a friend for followup of her locally advanced right breast cancer. She is ready to initiate her second half of neoadjuvant chemotherapy, and is due for day 1 cycle 1 of 4 planned dose dense cycles of paclitaxel/trastuzumab today. She had an echocardiogram last week which showed an ejection fraction of 55%.  Interval history is remarkable for both Jody Dunn and her husband having developed "food poisoning" earlier this week with nausea, emesis, and diarrhea. There were no fevers or chills. They have both been the same thing for breakfast at a local restaurant, and begin vomiting at approximately the same time early the next morning.  Fortunately this resolved approximately 24 hours ago. Jody Dunn has been able to hydrate herself, and was also able to eat this morning.   REVIEW OF SYSTEMS: Jody Dunn has had no fevers, chills, but does have occasional hot flashes. She currently denies any nausea. She's had no mouth ulcers or oral sensitivity. She denies cough, phlegm production, shortness of breath, or chest pain. No peripheral swelling. At baseline, she also denies any signs of peripheral neuropathy in either the upper or lower extremities. She's had no abnormal headaches, and no unusual myalgias or arthralgias.  A detailed review of systems is otherwise stable and noncontributory.  PAST MEDICAL HISTORY: Past Medical History  Diagnosis Date  . Iron (Fe) deficiency anemia   . Asthma     exercise induced  . Breast cancer   . Back pain   . Migraines   . Lactose intolerance   . Hyperlipidemia  PAST SURGICAL HISTORY: Past Surgical History  Procedure Date  . Appendectomy   . Shoulder surgery   . Knee surgery     X2  . Portacath placement 02/21/2012    Procedure: INSERTION PORT-A-CATH;  Surgeon: Jody Lint, MD;  Location: [facility];  Service: General;  Laterality: N/A;    . Skin biopsy 02/21/2012    Procedure: BIOPSY SKIN;  Surgeon: Jody Lint, MD;  Location: [facility];  Service: General;  Laterality: Right;    FAMILY HISTORY Family History  Problem Relation Age of Onset  . Cancer Paternal Aunt 34    Breast cancer (half sibling, related through father)  . Spina bifida Paternal Uncle 0    died around 23-59 months of age  . Cancer Maternal Grandmother 94    colon cancer  . Dementia Maternal Grandfather   . Cancer Paternal Grandmother     breast cancer between 89-42  . Cancer Paternal Grandfather     brain cancer  . Cancer Other     Paternal grandmother's siblings had breast cancer and pancreatic cancer  . Cancer Other 64    maternal great grandmother with breast cancer   the patient's parents are alive, in their early 15s. The patient is an only child. There is a significant family history of cancer including breast, brain, and colon, detailed in our genetic counselors report. BRCA and P. 53 testing is negative  GYNECOLOGIC HISTORY: Menarche age 5; she is GX P2, first live birth age 53.  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 Her husband Jody Dunn. works as a Furniture conservator/restorer for [employer]. Their children are Jody Dunn (2008) and Jody Dunn (2012).   ADVANCED DIRECTIVES: not in place  HEALTH MAINTENANCE: History  Substance Use Topics  . Smoking status: Never Smoker   . Smokeless tobacco: Never Used  . Alcohol Use: 2.5 - 3.5 oz/week    5-7 drink(s) per week     OCCASIONAL     Colonoscopy: no  PAP: 2012  Bone density: no  Lipid panel:  Allergies  Allergen Reactions  . Zofran (Ondansetron Hcl) Other (See Comments)    Migraine headaches  . Antiseptic Products, Misc.   Jody Dunn (Antiseptic Products, Misc.) Rash  . Penicillins Rash    Current Outpatient Prescriptions   Medication Sig Dispense Refill  . dexamethasone (DECADRON) 4 MG tablet Take 8 mg by mouth 2 (two) times daily with a meal.      . lidocaine-prilocaine (EMLA) cream       . LORazepam (ATIVAN) 0.5 MG tablet       . metoCLOPramide (REGLAN) 10 MG tablet Take 1 tablet (10 mg total) by mouth 4 (four) times daily as needed.  30 tablet  3  . omeprazole (PRILOSEC) 20 MG capsule Take 1 capsule (20 mg total) by mouth daily.  60 capsule  4  . Probiotic Product (PROBIOTIC & ACIDOPHILUS EX ST) CAPS Take 1 capsule by mouth daily.        . promethazine (PHENERGAN) 25 MG tablet Take 25 mg by mouth every 6 (six) hours as needed.      . SUMAtriptan (IMITREX) 50 MG tablet Take 1 tablet (50 mg total) by mouth as directed.  10 tablet  6   No current facility-administered medications for this visit.   Facility-Administered Medications Ordered in Other Visits  Medication  Dose Route Frequency Provider Last Rate Last Dose  . 0.9 %  sodium chloride infusion   Intravenous Once Lowella Dell, MD 20 mL/hr at 04/26/12 1234    . acetaminophen (TYLENOL) tablet 650 mg  650 mg Oral Once Jody Gravel, PA   650 mg at 04/26/12 1246  . dexamethasone (DECADRON) injection 20 mg  20 mg Intravenous Once Lowella Dell, MD   20 mg at 04/26/12 1234  . diphenhydrAMINE (BENADRYL) injection 25 mg  25 mg Intravenous Once Lowella Dell, MD   25 mg at 04/26/12 1234  . famotidine (PEPCID) IVPB 20 mg  20 mg Intravenous Once Lowella Dell, MD   20 mg at 04/26/12 1303  . heparin lock flush 100 unit/mL  500 Units Intracatheter Once PRN Lowella Dell, MD      . PACLitaxel (TAXOL) 336 mg in dextrose 5 % 500 mL chemo infusion (> 80mg /m2)  175 mg/m2 (Treatment Plan Actual) Intravenous Once Lowella Dell, MD 185 mL/hr at 04/26/12 1449 336 mg at 04/26/12 1449  . palonosetron (ALOXI) injection 0.25 mg  0.25 mg Intravenous Once Jody Gravel, PA   0.25 mg at 04/26/12 1246  . sodium chloride 0.9 % injection 10 mL  10 mL Intracatheter  PRN Lowella Dell, MD      . trastuzumab (HERCEPTIN) 315 mg in sodium chloride 0.9 % 250 mL chemo infusion  4 mg/kg (Treatment Plan Actual) Intravenous Once Jody Arenson Allegra Grana, PA        OBJECTIVE: Young white woman in no acute distress. ECOG: 1 Filed Vitals:   04/26/12 1145  BP: 124/87  Pulse: 80  Temp: 97.5 F (36.4 C)  Resp: 20   Filed Weights   04/26/12 1145  Weight: 169 lb 9.6 oz (76.93 kg)   HEENT:  Sclerae anicteric.  Oropharynx clear.   Nodes:  No cervical or supraclavicular lymphadenopathy palpated Breast Exam: Deferred. The right axilla is clear, as is the left.  Lungs:  Clear to auscultation bilaterally.  No crackles, rhonchi, or wheezes.   Heart:  Regular rate and rhythm.   Abdomen:  Soft, nontender.  Positive bowel sounds.   Extremities:  No peripheral edema   Neuro:  Nonfocal.  Alert and oriented x3.    LAB RESULTS: Lab Results  Component Value Date   WBC 7.4 04/26/2012   NEUTROABS 6.2 04/26/2012   HGB 11.5* 04/26/2012   HCT 34.3* 04/26/2012   MCV 91.0 04/26/2012   PLT 201 04/26/2012      Chemistry      Component Value Date/Time   NA 138 04/26/2012 1051   NA 137 04/12/2012 0913   K 3.1* 04/26/2012 1051   K 3.7 04/12/2012 0913   CL 101 04/26/2012 1051   CL 102 04/12/2012 0913   CO2 24 04/26/2012 1051   CO2 27 04/12/2012 0913   BUN 10.0 04/26/2012 1051   BUN 8 04/12/2012 0913   CREATININE 0.9 04/26/2012 1051   CREATININE 0.77 04/12/2012 0913      Component Value Date/Time   CALCIUM 8.6 04/26/2012 1051   CALCIUM 9.1 04/12/2012 0913   ALKPHOS 119 04/26/2012 1051   ALKPHOS 122* 04/12/2012 0913   AST 41* 04/26/2012 1051   AST 19 04/12/2012 0913   ALT 23 04/26/2012 1051   ALT 15 04/12/2012 0913   BILITOT 0.50 04/26/2012 1051   BILITOT 0.2* 04/12/2012 0913       Lab Results  Component Value Date   LABCA2 28 02/16/2012  STUDIES:  Echocardiogram on 04/21/2012 shows an ejection fraction of 55%.   Mr Breast Bilateral W Wo Contrast  04/18/2012  *RADIOLOGY REPORT*  Clinical Data:  Assess response to neoadjuvant chemotherapy for right breast cancer.  BILATERAL BREAST MRI WITH AND WITHOUT CONTRAST  Technique: Multiplanar, multisequence MR images of both breasts were obtained prior to and following the intravenous administration of 16ml of MultiHance.  Three dimensional images were evaluated at the independent DynaCad workstation.  Comparison:  02/15/2012.  Findings: Significant reduction in background parenchymal enhancement in both breasts.  There is minimal background parenchymal enhancement in the left breast and mild background parenchymal enhancement in the right breast.  The previously seen biopsy proven malignant enhancing mass in the anterior aspect of the upper right breast, slightly medially, is no longer seen as a discrete enhancing mass.  The previously seen biopsy proven malignant enhancing mass in the more posterior upper outer right breast is no longer seen as a discrete mass.  Currently, in that area, there is some irregular, patchy, non mass enhancement in that region, measuring 3.3 x 3.2 x 1.1 cm in maximum dimensions.  This has predominately persistent enhancement kinetics with a small amount of plateau and rapid washin/washout kinetics.  There are additional small areas of nodular, mass-like enhancement, linear enhancement and patchy enhancement elsewhere throughout the right breast in the areas of previously demonstrated dense parenchymal enhancement.  These are asymmetrical compared to the left breast.  The previously seen abnormal right breast skin thickening is no longer demonstrated.  No masses or areas of enhancement suspicious for malignancy in the left breast.  The previously seen enlarged right axillary lymph nodes are no longer enlarged.  Interval post chemotherapy edema and enhancement within the sternum.  IMPRESSION:  1.  Significantly improved appearance of the right breast with scattered areas of residual enhancement throughout the breast, most likely  representing mild residual malignancy, as described above. 2.  Resolved right axillary metastatic adenopathy.  3.  No evidence of malignancy in the left breast.  RECOMMENDATION: Treatment plan  THREE-DIMENSIONAL MR IMAGE RENDERING ON INDEPENDENT WORKSTATION:  Three-dimensional MR images were rendered by post-processing of the original MR data on an independent workstation.  The three- dimensional MR images were interpreted, and findings were reported in the accompanying complete MRI report for this study.  BI-RADS CATEGORY 6:  Known biopsy-proven malignancy - appropriate action should be taken.   Original Report Authenticated By: Darrol Angel, M.D.      ASSESSMENT: 36 y.o. BRCA-negative [place] woman   (1)  status post right breast biopsy 02/09/2012 for multifocal pT4 cN1, stage IIIB invasive ductal carcinoma. Prognostic profile from one of the 2 masses shows it to be triple positive with an MIB-1 of 30%.   (2)  s/p 4 dose dense cycles of doxorubicin/ cyclophosphamide, to be followed by 4 cycles of paclitaxel, dose-dense, given with trastuzumab    PLAN:  Jody Dunn will proceed to treatment today as scheduled for her first dose of paclitaxel/trastuzumab. We again reviewed her antinausea regimen, and review possible side effects and chemotoxicities. She voices her understanding of all of the above. She will return tomorrow for her Neulasta injection, and I will see her next week on 05/03/2012 for assessment of chemotoxicity.  Jody Dunn will keep her appointment with Dr. Gala Romney on September 24 to review her cardiac function and recent echocardiogram.   Following neoadjuvant chemotherapy, her plan is for bilateral mastectomies, with immediate left reconstruction and delayed right reconstruction. She knows to call us  with any changes or problems.  Jody Dunn    04/26/2012

## 2012-04-27 ENCOUNTER — Ambulatory Visit (HOSPITAL_BASED_OUTPATIENT_CLINIC_OR_DEPARTMENT_OTHER): Payer: Managed Care, Other (non HMO)

## 2012-04-27 VITALS — BP 117/79 | HR 80 | Temp 97.3°F | Resp 18

## 2012-04-27 DIAGNOSIS — Z5189 Encounter for other specified aftercare: Secondary | ICD-10-CM

## 2012-04-27 DIAGNOSIS — C50419 Malignant neoplasm of upper-outer quadrant of unspecified female breast: Secondary | ICD-10-CM

## 2012-04-27 MED ORDER — PEGFILGRASTIM INJECTION 6 MG/0.6ML
6.0000 mg | Freq: Once | SUBCUTANEOUS | Status: AC
  Administered 2012-04-27: 6 mg via SUBCUTANEOUS
  Filled 2012-04-27: qty 0.6

## 2012-04-28 ENCOUNTER — Other Ambulatory Visit: Payer: Self-pay | Admitting: *Deleted

## 2012-04-28 MED ORDER — HYDROCODONE-ACETAMINOPHEN 5-325 MG PO TABS: 2.0000 | ORAL_TABLET | Freq: Four times a day (QID) | ORAL | Status: AC | PRN

## 2012-04-28 NOTE — Telephone Encounter (Signed)
Message copied by Augusto Garbe on Fri Apr 28, 2012 12:23 PM ------      Message from: Melton Krebs D      Created: Wed Apr 26, 2012  5:42 PM      Regarding: 1st Time Taxol and Herceptin (Patient of Dr. Darnelle Catalan)      Contact: [phone]       Jody Dunn is changing Chemo regimens from Adriamycin and Cytoxan to Taxol/Herceptin. She received her first treatment of Herceptin and Taxol on 04/26/2012. She will come to the Infusion Center on 04/27/2012 for a Neulasta Shot. Will you please check on her to see how she is doing on Friday?            Thank You,

## 2012-04-28 NOTE — Telephone Encounter (Signed)
Message copied by WINSTON-SPRUIELL, Davisha Linthicum on Fri Apr 28, 2012 12:23 PM ------      Message from: BAUER, STEPHANYE D      Created: Wed Apr 26, 2012  5:42 PM      Regarding: 1st Time Taxol and Herceptin (Patient of Dr. Magrinat)      Contact: [phone]       Jody Dunn is changing Chemo regimens from Adriamycin and Cytoxan to Taxol/Herceptin. She received her first treatment of Herceptin and Taxol on 04/26/2012. She will come to the Infusion Center on 04/27/2012 for a Neulasta Shot. Will you please check on her to see how she is doing on Friday?            Thank You, 

## 2012-04-28 NOTE — Telephone Encounter (Signed)
Pt called to this RN stating concern due to onset in 2 weeks of " another period ". Per pt she has had 3 periods since starting chemotherpy.  Per discussion Milanna's menstrual history has been irregular " for about 81yrs " due to pregnancies and nursing.  Of note Harlean's last period prior to starting chemo was several years ago due to pregnancy and nursing- she had to cease nursing per use of chemotherapy.  She states periods as " heavy after the 1st day " with 1st menstraul cycle post chemo " as the worst ".  This RN discussed with pt above not a common side effect and may be related return of menses while under chemotherapy. As well as chemo may be interrupting hormonal cycle.  Evellyn states she is already scheduled for her routine PAP at [facility] under midwife Tanya Daily on Wednesday 9/11.  This RN informed Zea above would be recommendation per this office per ongoing irregular periods which Mixtli felt good about proceeding with.  Records will be sent for her appt due to concerns.  Adanna also states she is having increased bone pain and was hoping to get a script for stronger pain med " but not too strong because I have my children to care for ".  Vicoden called in for pain relief.  No other needs at this time.

## 2012-04-28 NOTE — Telephone Encounter (Signed)
Called patient for follow up after new treatment regimen.  Message left requesting a return call.  Awaiting return call.

## 2012-05-03 ENCOUNTER — Encounter: Payer: Self-pay | Admitting: Physician Assistant

## 2012-05-03 ENCOUNTER — Telehealth: Payer: Self-pay | Admitting: *Deleted

## 2012-05-03 ENCOUNTER — Ambulatory Visit (HOSPITAL_BASED_OUTPATIENT_CLINIC_OR_DEPARTMENT_OTHER): Payer: Managed Care, Other (non HMO) | Admitting: Physician Assistant

## 2012-05-03 ENCOUNTER — Other Ambulatory Visit (HOSPITAL_BASED_OUTPATIENT_CLINIC_OR_DEPARTMENT_OTHER): Payer: Managed Care, Other (non HMO) | Admitting: Lab

## 2012-05-03 VITALS — BP 124/81 | HR 91 | Temp 98.6°F | Resp 20 | Ht 68.0 in | Wt 172.8 lb

## 2012-05-03 DIAGNOSIS — C50419 Malignant neoplasm of upper-outer quadrant of unspecified female breast: Secondary | ICD-10-CM

## 2012-05-03 DIAGNOSIS — Z170 Estrogen receptor positive status [ER+]: Secondary | ICD-10-CM

## 2012-05-03 LAB — CBC WITH DIFFERENTIAL/PLATELET
BASO%: 1.3 % (ref 0.0–2.0)
EOS%: 0.6 % (ref 0.0–7.0)
Eosinophils Absolute: 0.2 10*3/uL (ref 0.0–0.5)
LYMPH%: 4.9 % — ABNORMAL LOW (ref 14.0–49.7)
MCHC: 33.4 g/dL (ref 31.5–36.0)
MCV: 93 fL (ref 79.5–101.0)
MONO%: 4.1 % (ref 0.0–14.0)
NEUT#: 28.5 10*3/uL — ABNORMAL HIGH (ref 1.5–6.5)
Platelets: 296 10*3/uL (ref 145–400)
RBC: 3.2 10*6/uL — ABNORMAL LOW (ref 3.70–5.45)
RDW: 16.3 % — ABNORMAL HIGH (ref 11.2–14.5)

## 2012-05-03 NOTE — Telephone Encounter (Signed)
lab, AB and chemo 10/2 and 10/16; lab and AB 10/9; lab and GM 10/23  Sent michelle email to set up patient treatment  Patient aware

## 2012-05-03 NOTE — Telephone Encounter (Signed)
Per staff message and POF I have scheduled appts.  JMW  

## 2012-05-03 NOTE — Progress Notes (Signed)
ID: Jody Dunn   DOB: Nov 26, 1975  MR#: [id number]  WUJ#:[id number]  HISTORY OF PRESENT ILLNESS: Bevan had a mammogram at [facility] of 2010 because of shooting pains in the right breast. This found no worrisome finding. More recently, in the spring of 2012, the patient at delivered her second child. She nursed but the child refused to take milk from the right breast. The patient tells me that she has been able to palpate easily from the right breast, so production is not an issue. More recently, early June 2013, the patient felt a new lump in the right breast, and brought this to the attention of [name] at [facility]. The patient had repeat mammography at [facility] 02/07/2012. This found at the breast tissue to be heterogeneously dense, which is not a surprise given that the patient is lactating. There was diffuse skin thickening. Erythema is not described. The nipple was retracted. There was no definitive peau d'orange appearance. Pleomorphic calcifications in the lateral right breast extended approximately 11 cm, without definite mass. On ultrasound there was a vague irregular hypoechoic mass measuring approximately 15 mm in the right breast, with a second ill-defined hypoechoic lobulated mass measuring approximately 17 mm. Both these masses were biopsied 02/09/2012, and the final pathology ([id number]) showed both IIb invasive ductal carcinoma, with a prognostic panel (from the mass at 11:00) as follows: estrogen receptor positive at 90%, progesterone receptor positive at 90%, Mib-1 of 30%, and HER-2 amplification with a ratio of 4.29 by CISH. MRI of the breast was performed 02/15/2012 and showed the mass in the 10:00 position to measure 3.1 cm, the one at the 11:00 position to measure 3.1 cm. There was diffuse skin thickening in the right breast and 2 enhancing abnormal-appearing lower right axillary lymph nodes measuring 2.0 and 1.8 cm respectively. There was no evidence for internal mammary  adenopathy, left axillary adenopathy, or significant findings in the left breast.  INTERVAL HISTORY: Roni returns today  for followup of her locally advanced right breast cancer, currently day 8 cycle 1 of 4 planned dose dense cycles of paclitaxel/trastuzumab.  Jamie tolerated the treatment well. Her biggest complaint is diffuse bony pain which has been "terrible".  She had some Vicodin on hand at home which helped somewhat with the pain. Fortunately, it is beginning to improve.  Since initiating chemotherapy, Breashia has had irregular vaginal bleeding. She saw her midwife this morning and they certainly think this is due to shifts in Bailei's hormones. The recommendation at this point is to proceed with total hysterectomy and salpingo-oophorectomy following breast surgery.  REVIEW OF SYSTEMS: Camera has had no fevers, chills, but does have occasional hot flashes. She denies nausea or emesis. She is slightly constipated after taking the Vicodin, but otherwise has had no change in bowel habits. She's had no mouth ulcers or oral sensitivity. She denies cough, phlegm production, shortness of breath, or chest pain. No peripheral swelling. She's had no signs of peripheral neuropathy in either the upper or lower extremities. She's had no abnormal headaches, and no unusual myalgias or arthralgias.  A detailed review of systems is otherwise stable and noncontributory.  PAST MEDICAL HISTORY: Past Medical History  Diagnosis Date  . Iron (Fe) deficiency anemia   . Asthma     exercise induced  . Breast cancer   . Back pain   . Migraines   . Lactose intolerance   . Hyperlipidemia     PAST SURGICAL HISTORY: Past Surgical History  Procedure Date  .  Appendectomy   . Shoulder surgery   . Knee surgery     X2  . Portacath placement 02/21/2012    Procedure: INSERTION PORT-A-CATH;  Surgeon: Almond Lint, MD;  Location: [facility];  Service: General;  Laterality: N/A;  . Skin biopsy 02/21/2012    Procedure:  BIOPSY SKIN;  Surgeon: Almond Lint, MD;  Location: [facility];  Service: General;  Laterality: Right;    FAMILY HISTORY Family History  Problem Relation Age of Onset  . Cancer Paternal Aunt 71    Breast cancer (half sibling, related through father)  . Spina bifida Paternal Uncle 0    died around 105-19 months of age  . Cancer Maternal Grandmother 2    colon cancer  . Dementia Maternal Grandfather   . Cancer Paternal Grandmother     breast cancer between 33-42  . Cancer Paternal Grandfather     brain cancer  . Cancer Other     Paternal grandmother's siblings had breast cancer and pancreatic cancer  . Cancer Other 43    maternal great grandmother with breast cancer   the patient's parents are alive, in their early 43s. The patient is an only child. There is a significant family history of cancer including breast, brain, and colon, detailed in our genetic counselors report. BRCA and P. 53 testing is negative  GYNECOLOGIC HISTORY: Menarche age 55; she is GX P2, first live birth age 19.  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 works as a Furniture conservator/restorer for [employer]. Their children are [name] (2008) and [name] (2012).   ADVANCED DIRECTIVES: not in place  HEALTH MAINTENANCE: History  Substance Use Topics  . Smoking status: Never Smoker   . Smokeless tobacco: Never Used  . Alcohol Use: 2.5 - 3.5 oz/week    5-7 drink(s) per week     OCCASIONAL     Colonoscopy: no  PAP: 2012  Bone density: no  Lipid panel:  Allergies  Allergen Reactions  . Zofran (Ondansetron Hcl) Other (See Comments)    Migraine headaches  . Antiseptic Products, Misc.   [name] (Antiseptic Products, Misc.) Rash  . Penicillins Rash    Current Outpatient Prescriptions  Medication Sig Dispense Refill  .  dexamethasone (DECADRON) 4 MG tablet Take 8 mg by mouth 2 (two) times daily with a meal.      . HYDROcodone-acetaminophen (NORCO/VICODIN) 5-325 MG per tablet Take 2 tablets by mouth every 6 (six) hours as needed for pain.  60 tablet  0  . lidocaine-prilocaine (EMLA) cream       . LORazepam (ATIVAN) 0.5 MG tablet       . metoCLOPramide (REGLAN) 10 MG tablet Take 1 tablet (10 mg total) by mouth 4 (four) times daily as needed.  30 tablet  3  . omeprazole (PRILOSEC) 20 MG capsule Take 1 capsule (20 mg total) by mouth daily.  60 capsule  4  . Probiotic Product (PROBIOTIC & ACIDOPHILUS EX ST) CAPS Take 1 capsule by mouth daily.        . promethazine (PHENERGAN) 25 MG tablet Take 25 mg by mouth every 6 (six) hours as needed.      . SUMAtriptan (IMITREX) 50 MG tablet Take 1 tablet (50 mg total) by mouth as directed.  10 tablet  6  . zaleplon (SONATA) 10  MG capsule         OBJECTIVE: Young white woman in no acute distress. ECOG: 1 Filed Vitals:   05/03/12 1317  BP: 124/81  Pulse: 91  Temp: 98.6 F (37 C)  Resp: 20   Filed Weights   05/03/12 1317  Weight: 172 lb 12.8 oz (78.382 kg)   HEENT:  Sclerae anicteric.  Oropharynx clear.   Nodes:  No cervical or supraclavicular lymphadenopathy palpated Breast Exam: I was unable to palpate a mass in the right breast today. There is no obvious skin change or nipple inversion on the right. Left breast is unremarkable. The right axilla is clear, as is the left.  Lungs:  Clear to auscultation bilaterally.  No crackles, rhonchi, or wheezes.   Heart:  Regular rate and rhythm.   Abdomen:  Soft, nontender.  Positive bowel sounds.   Extremities:  No peripheral edema   Neuro:  Nonfocal.  Alert and oriented x3.    LAB RESULTS: Lab Results  Component Value Date   WBC 7.4 04/26/2012   NEUTROABS 6.2 04/26/2012   HGB 11.5* 04/26/2012   HCT 34.3* 04/26/2012   MCV 91.0 04/26/2012   PLT 201 04/26/2012       Chemistry      Component Value Date/Time   NA 138  04/26/2012 1051   NA 137 04/12/2012 0913   K 3.1* 04/26/2012 1051   K 3.7 04/12/2012 0913   CL 101 04/26/2012 1051   CL 102 04/12/2012 0913   CO2 24 04/26/2012 1051   CO2 27 04/12/2012 0913   BUN 10.0 04/26/2012 1051   BUN 8 04/12/2012 0913   CREATININE 0.9 04/26/2012 1051   CREATININE 0.77 04/12/2012 0913      Component Value Date/Time   CALCIUM 8.6 04/26/2012 1051   CALCIUM 9.1 04/12/2012 0913   ALKPHOS 119 04/26/2012 1051   ALKPHOS 122* 04/12/2012 0913   AST 41* 04/26/2012 1051   AST 19 04/12/2012 0913   ALT 23 04/26/2012 1051   ALT 15 04/12/2012 0913   BILITOT 0.50 04/26/2012 1051   BILITOT 0.2* 04/12/2012 0913       Lab Results  Component Value Date   LABCA2 28 02/16/2012     STUDIES:  Echocardiogram on 04/21/2012 shows an ejection fraction of 55%.    ASSESSMENT: 36 y.o. BRCA-negative [place] woman   (1)  status post right breast biopsy 02/09/2012 for multifocal pT4 cN1, stage IIIB invasive ductal carcinoma. Prognostic profile from one of the 2 masses shows it to be triple positive with an MIB-1 of 30%.   (2)  s/p 4 dose dense cycles of doxorubicin/ cyclophosphamide, to be followed by 4 cycles of paclitaxel, dose-dense, given with trastuzumab    PLAN:  Bobbe has tolerated her first dose of paclitaxel/trastuzumab well. She will return next week for followup with Dr. Darnelle Catalan prior to cycle 2 on September 18.  Tentatively, Jamie's plan is to have a left mastectomy with immediate reconstruction at the time of her right mastectomy. Following radiation therapy, and prior to trauma reconstruction on the right, she plans to undergo a hysterectomy with salpingo-oophorectomy.    Puja will keep her appointment with Dr. Gala Romney on September 24 to review her cardiac function and recent echocardiogram.   She knows to call us with any changes or problems.  Stanley Lyness    05/03/2012

## 2012-05-10 ENCOUNTER — Ambulatory Visit (HOSPITAL_BASED_OUTPATIENT_CLINIC_OR_DEPARTMENT_OTHER): Payer: Managed Care, Other (non HMO) | Admitting: Oncology

## 2012-05-10 ENCOUNTER — Ambulatory Visit (HOSPITAL_BASED_OUTPATIENT_CLINIC_OR_DEPARTMENT_OTHER): Payer: Managed Care, Other (non HMO)

## 2012-05-10 ENCOUNTER — Other Ambulatory Visit (HOSPITAL_BASED_OUTPATIENT_CLINIC_OR_DEPARTMENT_OTHER): Payer: Managed Care, Other (non HMO) | Admitting: Lab

## 2012-05-10 VITALS — BP 128/83 | HR 93 | Temp 97.9°F | Resp 20 | Ht 68.0 in | Wt 173.4 lb

## 2012-05-10 DIAGNOSIS — C50419 Malignant neoplasm of upper-outer quadrant of unspecified female breast: Secondary | ICD-10-CM

## 2012-05-10 DIAGNOSIS — M899 Disorder of bone, unspecified: Secondary | ICD-10-CM

## 2012-05-10 DIAGNOSIS — Z5111 Encounter for antineoplastic chemotherapy: Secondary | ICD-10-CM

## 2012-05-10 DIAGNOSIS — Z5112 Encounter for antineoplastic immunotherapy: Secondary | ICD-10-CM

## 2012-05-10 DIAGNOSIS — M949 Disorder of cartilage, unspecified: Secondary | ICD-10-CM

## 2012-05-10 LAB — COMPREHENSIVE METABOLIC PANEL (CC13)
BUN: 12 mg/dL (ref 7.0–26.0)
CO2: 22 mEq/L (ref 22–29)
Creatinine: 0.9 mg/dL (ref 0.6–1.1)
Glucose: 116 mg/dl — ABNORMAL HIGH (ref 70–99)
Sodium: 138 mEq/L (ref 136–145)
Total Bilirubin: 0.5 mg/dL (ref 0.20–1.20)
Total Protein: 6.8 g/dL (ref 6.4–8.3)

## 2012-05-10 LAB — CBC WITH DIFFERENTIAL/PLATELET
Basophils Absolute: 0.1 10*3/uL (ref 0.0–0.1)
Eosinophils Absolute: 0.1 10*3/uL (ref 0.0–0.5)
HCT: 30.5 % — ABNORMAL LOW (ref 34.8–46.6)
HGB: 10.5 g/dL — ABNORMAL LOW (ref 11.6–15.9)
LYMPH%: 31.7 % (ref 14.0–49.7)
MONO#: 0.6 10*3/uL (ref 0.1–0.9)
NEUT#: 4.7 10*3/uL (ref 1.5–6.5)
NEUT%: 58.9 % (ref 38.4–76.8)
Platelets: 164 10*3/uL (ref 145–400)
WBC: 7.9 10*3/uL (ref 3.9–10.3)

## 2012-05-10 MED ORDER — TRASTUZUMAB CHEMO INJECTION 440 MG
4.0000 mg/kg | Freq: Once | INTRAVENOUS | Status: AC
  Administered 2012-05-10: 315 mg via INTRAVENOUS
  Filled 2012-05-10: qty 15

## 2012-05-10 MED ORDER — ACETAMINOPHEN 325 MG PO TABS
650.0000 mg | ORAL_TABLET | Freq: Once | ORAL | Status: AC
  Administered 2012-05-10: 650 mg via ORAL

## 2012-05-10 MED ORDER — SODIUM CHLORIDE 0.9 % IJ SOLN
10.0000 mL | INTRAMUSCULAR | Status: DC | PRN
  Administered 2012-05-10: 10 mL
  Filled 2012-05-10: qty 10

## 2012-05-10 MED ORDER — PACLITAXEL CHEMO INJECTION 300 MG/50ML
175.0000 mg/m2 | Freq: Once | INTRAVENOUS | Status: AC
  Administered 2012-05-10: 336 mg via INTRAVENOUS
  Filled 2012-05-10: qty 56

## 2012-05-10 MED ORDER — PALONOSETRON HCL INJECTION 0.25 MG/5ML
0.2500 mg | Freq: Once | INTRAVENOUS | Status: AC
  Administered 2012-05-10: 0.25 mg via INTRAVENOUS

## 2012-05-10 MED ORDER — HEPARIN SOD (PORK) LOCK FLUSH 100 UNIT/ML IV SOLN
500.0000 [IU] | Freq: Once | INTRAVENOUS | Status: AC | PRN
  Administered 2012-05-10: 500 [IU]
  Filled 2012-05-10: qty 5

## 2012-05-10 MED ORDER — DIPHENHYDRAMINE HCL 50 MG/ML IJ SOLN
25.0000 mg | Freq: Once | INTRAMUSCULAR | Status: AC
  Administered 2012-05-10: 50 mg via INTRAVENOUS

## 2012-05-10 MED ORDER — SODIUM CHLORIDE 0.9 % IV SOLN
Freq: Once | INTRAVENOUS | Status: AC
  Administered 2012-05-10: 11:00:00 via INTRAVENOUS

## 2012-05-10 MED ORDER — FAMOTIDINE IN NACL 20-0.9 MG/50ML-% IV SOLN
20.0000 mg | Freq: Once | INTRAVENOUS | Status: AC
  Administered 2012-05-10: 20 mg via INTRAVENOUS

## 2012-05-10 MED ORDER — DEXAMETHASONE SODIUM PHOSPHATE 4 MG/ML IJ SOLN
20.0000 mg | Freq: Once | INTRAMUSCULAR | Status: AC
  Administered 2012-05-10: 20 mg via INTRAVENOUS

## 2012-05-10 NOTE — Patient Instructions (Addendum)
[facility] Discharge Instructions for Patients Receiving Chemotherapy  Today you received the following chemotherapy agents taxol  To help prevent nausea and vomiting after your treatment, we encourage you to take your nausea medication  and take it as often as prescribed   If you develop nausea and vomiting that is not controlled by your nausea medication, call the clinic. If it is after clinic hours your family physician or the after hours number for the clinic or go to the Emergency Department.   BELOW ARE SYMPTOMS THAT SHOULD BE REPORTED IMMEDIATELY:  *FEVER GREATER THAN 100.5 F  *CHILLS WITH OR WITHOUT FEVER  NAUSEA AND VOMITING THAT IS NOT CONTROLLED WITH YOUR NAUSEA MEDICATION  *UNUSUAL SHORTNESS OF BREATH  *UNUSUAL BRUISING OR BLEEDING  TENDERNESS IN MOUTH AND THROAT WITH OR WITHOUT PRESENCE OF ULCERS  *URINARY PROBLEMS  *BOWEL PROBLEMS  UNUSUAL RASH Items with * indicate a potential emergency and should be followed up as soon as possible.  One of the nurses will contact you 24 hours after your treatment. Please let the nurse know about any problems that you may have experienced. Feel free to call the clinic you have any questions or concerns. The clinic phone number is [phone].   I have been informed and understand all the instructions given to me. I know to contact the clinic, my physician, or go to the Emergency Department if any problems should occur. I do not have any questions at this time, but understand that I may call the clinic during office hours   should I have any questions or need assistance in obtaining follow up care.    __________________________________________  _____________  __________ Signature of Patient or Authorized Representative            Date                   Time    __________________________________________ Nurse's Signature    

## 2012-05-10 NOTE — Progress Notes (Signed)
ID: Jody Dunn   DOB: 02-13-1976  MR#: [id number]  CSN#:[id number]  HISTORY OF PRESENT ILLNESS: Veanna had a mammogram at [facility] of 2010 because of shooting pains in the right breast. This found no worrisome finding. More recently, in the spring of 2012, the patient at delivered her second child. She nursed but the child refused to take milk from the right breast. The patient tells me that she has been able to palpate easily from the right breast, so production is not an issue. More recently, early June 2013, the patient felt a new lump in the right breast, and brought this to the attention of [name] at [facility]. The patient had repeat mammography at [facility] 02/07/2012. This found at the breast tissue to be heterogeneously dense, which is not a surprise given that the patient is lactating. There was diffuse skin thickening. Erythema is not described. The nipple was retracted. There was no definitive peau d'orange appearance. Pleomorphic calcifications in the lateral right breast extended approximately 11 cm, without definite mass. On ultrasound there was a vague irregular hypoechoic mass measuring approximately 15 mm in the right breast, with a second ill-defined hypoechoic lobulated mass measuring approximately 17 mm. Both these masses were biopsied 02/09/2012, and the final pathology ([id number]) showed both IIb invasive ductal carcinoma, with a prognostic panel (from the mass at 11:00) as follows: estrogen receptor positive at 90%, progesterone receptor positive at 90%, Mib-1 of 30%, and HER-2 amplification with a ratio of 4.29 by CISH. MRI of the breast was performed 02/15/2012 and showed the mass in the 10:00 position to measure 3.1 cm, the one at the 11:00 position to measure 3.1 cm. There was diffuse skin thickening in the right breast and 2 enhancing abnormal-appearing lower right axillary lymph nodes measuring 2.0 and 1.8 cm respectively. There was no evidence for internal mammary  adenopathy, left axillary adenopathy, or significant findings in the left breast.  INTERVAL HISTORY: Jasmina returns today with her friend Madison Hickman for followup of her locally advanced right breast cancer, currently day 1 cycle 2 of 4 planned dose dense cycles of paclitaxel/trastuzumab.  REVIEW OF SYSTEMS: Leronda is having no neuropathy problems. The big issue was bone pain, days 2 and 3 after her first cycle of Taxol. Hopefully this will not be as severe with cycle 2, but that is discussed further below. Otherwise her port is working well, she had a period 2 weeks ago (again), and a detailed review of systems today was entirely noncontributory. She continues to take care of HER-2 children, work full-time, and occasionally exercise.  PAST MEDICAL HISTORY: Past Medical History  Diagnosis Date  . Iron (Fe) deficiency anemia   . Asthma     exercise induced  . Breast cancer   . Back pain   . Migraines   . Lactose intolerance   . Hyperlipidemia     PAST SURGICAL HISTORY: Past Surgical History  Procedure Date  . Appendectomy   . Shoulder surgery   . Knee surgery     X2  . Portacath placement 02/21/2012    Procedure: INSERTION PORT-A-CATH;  Surgeon: Almond Lint, MD;  Location: [facility];  Service: General;  Laterality: N/A;  . Skin biopsy 02/21/2012    Procedure: BIOPSY SKIN;  Surgeon: Almond Lint, MD;  Location: [facility];  Service: General;  Laterality: Right;    FAMILY HISTORY Family History  Problem Relation Age of Onset  . Cancer Paternal Aunt 12    Breast cancer (half sibling, related  through father)  . Spina bifida Paternal Uncle 0    died around 16-97 months of age  . Cancer Maternal Grandmother 24    colon cancer  . Dementia Maternal Grandfather   . Cancer Paternal Grandmother     breast cancer between 46-42  . Cancer Paternal Grandfather     brain cancer  . Cancer Other     Paternal grandmother's siblings had breast cancer and pancreatic cancer  . Cancer Other 8    maternal  great grandmother with breast cancer   the patient's parents are alive, in their early 62s. The patient is an only child. There is a significant family history of cancer including breast, brain, and colon, detailed in our genetic counselors report. BRCA and P. 53 testing is negative  GYNECOLOGIC HISTORY: Menarche age 46; she is GX P2, first live birth age 67.  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 works as a Furniture conservator/restorer for [employer]. Their children are [name] (2008) and [name] (2012).   ADVANCED DIRECTIVES: not in place  HEALTH MAINTENANCE: History  Substance Use Topics  . Smoking status: Never Smoker   . Smokeless tobacco: Never Used  . Alcohol Use: 2.5 - 3.5 oz/week    5-7 drink(s) per week     OCCASIONAL     Colonoscopy: no  PAP: 2012  Bone density: no  Lipid panel:  Allergies  Allergen Reactions  . Zofran (Ondansetron Hcl) Other (See Comments)    Migraine headaches  . Antiseptic Products, Misc.   [name] (Antiseptic Products, Misc.) Rash  . Penicillins Rash    Current Outpatient Prescriptions  Medication Sig Dispense Refill  . dexamethasone (DECADRON) 4 MG tablet Take 8 mg by mouth 2 (two) times daily with a meal.      . lidocaine-prilocaine (EMLA) cream       . LORazepam (ATIVAN) 0.5 MG tablet       . metoCLOPramide (REGLAN) 10 MG tablet Take 1 tablet (10 mg total) by mouth 4 (four) times daily as needed.  30 tablet  3  . omeprazole (PRILOSEC) 20 MG capsule Take 1 capsule (20 mg total) by mouth daily.  60 capsule  4  . Probiotic Product (PROBIOTIC & ACIDOPHILUS EX ST) CAPS Take 1 capsule by mouth daily.        . promethazine (PHENERGAN) 25 MG tablet Take 25 mg by mouth every 6 (six) hours as needed.      . SUMAtriptan (IMITREX) 50 MG tablet Take 1 tablet (50 mg total)  by mouth as directed.  10 tablet  6  . zaleplon (SONATA) 10 MG capsule         OBJECTIVE: Young white woman who appears well ECOG: 0 Filed Vitals:   05/10/12 0900  BP: 128/83  Pulse: 93  Temp: 97.9 F (36.6 C)  Resp: 20   Filed Weights   05/10/12 0900  Weight: 173 lb 6.4 oz (78.654 kg)   HEENT:  Sclerae anicteric.  Oropharynx clear.   Nodes:  No cervical or supraclavicular lymphadenopathy palpated Breast Exam: Deferred  Lungs:  Clear to auscultation bilaterally.  No crackles, rhonchi, or wheezes.   Heart:  Regular rate and rhythm.   Abdomen:  Soft, nontender.  Positive bowel sounds.   Extremities:  No peripheral edema   Neuro:  Nonfocal.  Alert and oriented x3.    LAB RESULTS: Lab Results  Component Value Date   WBC 7.9 05/10/2012   NEUTROABS 4.7 05/10/2012   HGB 10.5* 05/10/2012   HCT 30.5* 05/10/2012   MCV 93.3 05/10/2012   PLT 164 05/10/2012       Chemistry      Component Value Date/Time   NA 138 05/10/2012 0842   NA 137 04/12/2012 0913   K 3.6 05/10/2012 0842   K 3.7 04/12/2012 0913   CL 106 05/10/2012 0842   CL 102 04/12/2012 0913   CO2 22 05/10/2012 0842   CO2 27 04/12/2012 0913   BUN 12.0 05/10/2012 0842   BUN 8 04/12/2012 0913   CREATININE 0.9 05/10/2012 0842   CREATININE 0.77 04/12/2012 0913      Component Value Date/Time   CALCIUM 8.6 05/10/2012 0842   CALCIUM 9.1 04/12/2012 0913   ALKPHOS 124 05/10/2012 0842   ALKPHOS 122* 04/12/2012 0913   AST 27 05/10/2012 0842   AST 19 04/12/2012 0913   ALT 20 05/10/2012 0842   ALT 15 04/12/2012 0913   BILITOT 0.50 05/10/2012 0842   BILITOT 0.2* 04/12/2012 0913       Lab Results  Component Value Date   LABCA2 28 02/16/2012     STUDIES:  Echocardiogram on 04/21/2012 shows an ejection fraction of 55%.  ASSESSMENT: 36 y.o. BRCA-negative [place] woman   (1)  status post right breast biopsy 02/09/2012 for multifocal pT4 cN1, stage IIIB invasive ductal carcinoma. Prognostic profile from one of the 2 masses shows it to  be triple positive with an MIB-1 of 30%.   (2)  s/p 4 dose dense cycles of doxorubicin/ cyclophosphamide, followed by 4 cycles of paclitaxel, dose-dense, given with trastuzumab (ongoing)   PLAN:  She is ready for cycle 2 of Taxol/trastuzumab today. I suggested she take Aleve, 2 tablets with food 3 times daily for the next couple of days, and use of Vicodin as needed. Nonetheless frequently the "first cycle aches and pains" are much at attenuated with the remaining cycles.   She will have completed chemotherapy on October 16, I anticipate she will be having her surgery late October or early November. We are not going to repeat an MRI preop, since she is quite clear on her decision to have bilateral mastectomies. I am hoping for a very good pathology report once we get the definitive surgery. She knows to call for any problems that may develop before the next visit.  MAGRINAT,GUSTAV C    05/10/2012

## 2012-05-11 ENCOUNTER — Ambulatory Visit (HOSPITAL_BASED_OUTPATIENT_CLINIC_OR_DEPARTMENT_OTHER): Payer: Managed Care, Other (non HMO)

## 2012-05-11 VITALS — BP 119/78 | HR 89 | Temp 97.9°F | Resp 16

## 2012-05-11 DIAGNOSIS — Z5189 Encounter for other specified aftercare: Secondary | ICD-10-CM

## 2012-05-11 DIAGNOSIS — C50419 Malignant neoplasm of upper-outer quadrant of unspecified female breast: Secondary | ICD-10-CM

## 2012-05-11 MED ORDER — PEGFILGRASTIM INJECTION 6 MG/0.6ML
6.0000 mg | Freq: Once | SUBCUTANEOUS | Status: AC
  Administered 2012-05-11: 6 mg via SUBCUTANEOUS
  Filled 2012-05-11: qty 0.6

## 2012-05-16 ENCOUNTER — Encounter (HOSPITAL_COMMUNITY): Payer: Managed Care, Other (non HMO)

## 2012-05-16 ENCOUNTER — Other Ambulatory Visit (HOSPITAL_COMMUNITY): Payer: Managed Care, Other (non HMO)

## 2012-05-17 ENCOUNTER — Encounter: Payer: Self-pay | Admitting: Physician Assistant

## 2012-05-17 ENCOUNTER — Other Ambulatory Visit (HOSPITAL_BASED_OUTPATIENT_CLINIC_OR_DEPARTMENT_OTHER): Payer: Managed Care, Other (non HMO) | Admitting: Lab

## 2012-05-17 ENCOUNTER — Ambulatory Visit (HOSPITAL_BASED_OUTPATIENT_CLINIC_OR_DEPARTMENT_OTHER): Payer: Managed Care, Other (non HMO) | Admitting: Physician Assistant

## 2012-05-17 VITALS — BP 120/79 | HR 97 | Temp 97.7°F | Resp 20 | Ht 68.0 in | Wt 171.6 lb

## 2012-05-17 DIAGNOSIS — Z170 Estrogen receptor positive status [ER+]: Secondary | ICD-10-CM

## 2012-05-17 DIAGNOSIS — C50419 Malignant neoplasm of upper-outer quadrant of unspecified female breast: Secondary | ICD-10-CM

## 2012-05-17 LAB — CBC WITH DIFFERENTIAL/PLATELET
BASO%: 0.5 % (ref 0.0–2.0)
Basophils Absolute: 0.2 10*3/uL — ABNORMAL HIGH (ref 0.0–0.1)
EOS%: 1.9 % (ref 0.0–7.0)
HGB: 9.3 g/dL — ABNORMAL LOW (ref 11.6–15.9)
MCH: 31.5 pg (ref 25.1–34.0)
MONO%: 6.6 % (ref 0.0–14.0)
RBC: 2.96 10*6/uL — ABNORMAL LOW (ref 3.70–5.45)
RDW: 16.3 % — ABNORMAL HIGH (ref 11.2–14.5)
lymph#: 3.3 10*3/uL (ref 0.9–3.3)

## 2012-05-17 NOTE — Progress Notes (Signed)
ID: Jody Dunn   DOB: 14-Jul-1976  MR#: [id number]  WUJ#:[id number]  HISTORY OF PRESENT ILLNESS: Jody Dunn had a mammogram at [facility] of 2010 because of shooting pains in the right breast. This found no worrisome finding. More recently, in the spring of 2012, the patient at delivered her second child. She nursed but the child refused to take milk from the right breast. The patient tells me that she has been able to palpate easily from the right breast, so production is not an issue. More recently, early June 2013, the patient felt a new lump in the right breast, and brought this to the attention of Jody Dunn at [facility]. The patient had repeat mammography at [facility] 02/07/2012. This found at the breast tissue to be heterogeneously dense, which is not a surprise given that the patient is lactating. There was diffuse skin thickening. Erythema is not described. The nipple was retracted. There was no definitive peau d'orange appearance. Pleomorphic calcifications in the lateral right breast extended approximately 11 cm, without definite mass. On ultrasound there was a vague irregular hypoechoic mass measuring approximately 15 mm in the right breast, with a second ill-defined hypoechoic lobulated mass measuring approximately 17 mm. Both these masses were biopsied 02/09/2012, and the final pathology ([id number]) showed both IIb invasive ductal carcinoma, with a prognostic panel (from the mass at 11:00) as follows: estrogen receptor positive at 90%, progesterone receptor positive at 90%, Mib-1 of 30%, and HER-2 amplification with a ratio of 4.29 by CISH. MRI of the breast was performed 02/15/2012 and showed the mass in the 10:00 position to measure 3.1 cm, the one at the 11:00 position to measure 3.1 cm. There was diffuse skin thickening in the right breast and 2 enhancing abnormal-appearing lower right axillary lymph nodes measuring 2.0 and 1.8 cm respectively. There was no evidence for internal mammary  adenopathy, left axillary adenopathy, or significant findings in the left breast.  INTERVAL HISTORY: Jody Dunn returns today for followup of her locally advanced right breast cancer, currently day 8 cycle 2 of 4 planned dose dense cycles of paclitaxel/trastuzumab.  She continues to tolerate treatment very well, and interval history is generally unremarkable.  REVIEW OF SYSTEMS: Jody Dunn  has few complaints today. She has developed some nasal congestion and a cough productive of clear phlegm. She's had no fevers, chills, or night sweats. She denies any pleurisy or shortness of breath. She continues to have some bone pain associated with Neulasta injection, but this has improved. She's had no additional myalgias or arthralgias, no peripheral swelling, and denies any signs of numbness or tingling in the upper or lower extremities. She's had a couple episodes of nausea with emesis, but these are brief and resolved quickly. She's had no change in bowel habits. No chest pain or palpitations. No abnormal headaches or dizziness.  A detailed review of systems is otherwise noncontributory.   PAST MEDICAL HISTORY: Past Medical History  Diagnosis Date  . Iron (Fe) deficiency anemia   . Asthma     exercise induced  . Breast cancer   . Back pain   . Migraines   . Lactose intolerance   . Hyperlipidemia     PAST SURGICAL HISTORY: Past Surgical History  Procedure Date  . Appendectomy   . Shoulder surgery   . Knee surgery     X2  . Portacath placement 02/21/2012    Procedure: INSERTION PORT-A-CATH;  Surgeon: Almond Lint, MD;  Location: [facility];  Service: General;  Laterality: N/A;  .  Skin biopsy 02/21/2012    Procedure: BIOPSY SKIN;  Surgeon: Almond Lint, MD;  Location: [facility];  Service: General;  Laterality: Right;    FAMILY HISTORY Family History  Problem Relation Age of Onset  . Cancer Paternal Aunt 13    Breast cancer (half sibling, related through father)  . Spina bifida Paternal Uncle 0    died  around 59-67 months of age  . Cancer Maternal Grandmother 14    colon cancer  . Dementia Maternal Grandfather   . Cancer Paternal Grandmother     breast cancer between 33-42  . Cancer Paternal Grandfather     brain cancer  . Cancer Other     Paternal grandmother's siblings had breast cancer and pancreatic cancer  . Cancer Other 72    maternal great grandmother with breast cancer   the patient's parents are alive, in their early 58s. The patient is an only child. There is a significant family history of cancer including breast, brain, and colon, detailed in our genetic counselors report. BRCA and P. 53 testing is negative  GYNECOLOGIC HISTORY: Menarche age 27; she is GX P2, first live birth age 43.  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 Her husband Jody Dunn. works as a Furniture conservator/restorer for [employer]. Their children are Jody Dunn (2008) and Jody Dunn (2012).   ADVANCED DIRECTIVES: not in place  HEALTH MAINTENANCE: History  Substance Use Topics  . Smoking status: Never Smoker   . Smokeless tobacco: Never Used  . Alcohol Use: 2.5 - 3.5 oz/week    5-7 drink(s) per week     OCCASIONAL     Colonoscopy: no  PAP: 2012  Bone density: no  Lipid panel:  Allergies  Allergen Reactions  . Zofran (Ondansetron Hcl) Other (See Comments)    Migraine headaches  . Antiseptic Products, Misc.   Jody Dunn (Antiseptic Products, Misc.) Rash  . Penicillins Rash    Current Outpatient Prescriptions  Medication Sig Dispense Refill  . HYDROcodone-acetaminophen (NORCO/VICODIN) 5-325 MG per tablet       . dexamethasone (DECADRON) 4 MG tablet Take 8 mg by mouth 2 (two) times daily with a meal.      . lidocaine-prilocaine (EMLA) cream       . LORazepam (ATIVAN) 0.5 MG tablet       . metoCLOPramide (REGLAN) 10 MG tablet Take 1 tablet (10  mg total) by mouth 4 (four) times daily as needed.  30 tablet  3  . omeprazole (PRILOSEC) 20 MG capsule Take 1 capsule (20 mg total) by mouth daily.  60 capsule  4  . Probiotic Product (PROBIOTIC & ACIDOPHILUS EX ST) CAPS Take 1 capsule by mouth daily.        . promethazine (PHENERGAN) 25 MG tablet Take 25 mg by mouth every 6 (six) hours as needed.      . SUMAtriptan (IMITREX) 50 MG tablet Take 1 tablet (50 mg total) by mouth as directed.  10 tablet  6  . zaleplon (SONATA) 10 MG capsule         OBJECTIVE: Young white woman who appears well ECOG: 0 Filed Vitals:   05/17/12 1400  BP: 120/79  Pulse: 97  Temp: 97.7 F (36.5 C)  Resp: 20   Filed Weights   05/17/12 1400  Weight: 171 lb 9.6 oz (77.837 kg)  HEENT:  Sclerae anicteric.  Oropharynx clear.   Nodes:  No cervical or supraclavicular lymphadenopathy palpated Breast Exam: Deferred  Lungs:  Clear to auscultation bilaterally.  No crackles, rhonchi, or wheezes.   Heart:  Regular rate and rhythm.   Abdomen:  Soft, nontender.  Positive bowel sounds.   Extremities:  No peripheral edema   Neuro:  Nonfocal.  Alert and oriented x3.    LAB RESULTS: Lab Results  Component Value Date   WBC 33.4* 05/17/2012   NEUTROABS 27.1* 05/17/2012   HGB 9.3* 05/17/2012   HCT 27.7* 05/17/2012   MCV 93.4 05/17/2012   PLT 304 05/17/2012       Chemistry      Component Value Date/Time   NA 138 05/10/2012 0842   NA 137 04/12/2012 0913   K 3.6 05/10/2012 0842   K 3.7 04/12/2012 0913   CL 106 05/10/2012 0842   CL 102 04/12/2012 0913   CO2 22 05/10/2012 0842   CO2 27 04/12/2012 0913   BUN 12.0 05/10/2012 0842   BUN 8 04/12/2012 0913   CREATININE 0.9 05/10/2012 0842   CREATININE 0.77 04/12/2012 0913      Component Value Date/Time   CALCIUM 8.6 05/10/2012 0842   CALCIUM 9.1 04/12/2012 0913   ALKPHOS 124 05/10/2012 0842   ALKPHOS 122* 04/12/2012 0913   AST 27 05/10/2012 0842   AST 19 04/12/2012 0913   ALT 20 05/10/2012 0842   ALT 15 04/12/2012 0913   BILITOT  0.50 05/10/2012 0842   BILITOT 0.2* 04/12/2012 0913       Lab Results  Component Value Date   LABCA2 28 02/16/2012     STUDIES:  Echocardiogram on 04/21/2012 shows an ejection fraction of 55%.  She will be following up with Dr. Gala Romney within the next couple of weeks.  ASSESSMENT: 36 y.o. BRCA-negative [place] woman   (1)  status post right breast biopsy 02/09/2012 for multifocal pT4 cN1, stage IIIB invasive ductal carcinoma. Prognostic profile from one of the 2 masses shows it to be triple positive with an MIB-1 of 30%.   (2)  s/p 4 dose dense cycles of doxorubicin/ cyclophosphamide, followed by 4 cycles of paclitaxel, dose-dense, given with trastuzumab (ongoing)   PLAN:  Brettney continues to tolerate her therapy well, and at this point, we are making no changes.    She does note that she can utilize over-the-counter medications for her cold, but will call us with any worsening symptoms, and certainly with any fevers of 100 or above.   She is scheduled to meet with a plastic surgeon in [place] on Monday for a second opinion regarding reconstruction. She will see me again next week on October 2, in anticipation of her third dose of paclitaxel/trastuzumab. I will mention she is also scheduled to meet with her surgeon, Dr. Donell Beers, next week on October 4. We are not going to repeat an MRI preop, since she is quite clear on her decision to have bilateral mastectomies.   Myleena voices understanding and agreement with our plan, and call with any changes or problems.   Sheriann Newmann    05/17/2012

## 2012-05-19 ENCOUNTER — Other Ambulatory Visit: Payer: Self-pay | Admitting: *Deleted

## 2012-05-22 ENCOUNTER — Encounter (HOSPITAL_COMMUNITY): Payer: Managed Care, Other (non HMO)

## 2012-05-24 ENCOUNTER — Ambulatory Visit (HOSPITAL_BASED_OUTPATIENT_CLINIC_OR_DEPARTMENT_OTHER): Payer: Managed Care, Other (non HMO) | Admitting: Physician Assistant

## 2012-05-24 ENCOUNTER — Encounter: Payer: Self-pay | Admitting: Physician Assistant

## 2012-05-24 ENCOUNTER — Other Ambulatory Visit (HOSPITAL_BASED_OUTPATIENT_CLINIC_OR_DEPARTMENT_OTHER): Payer: Managed Care, Other (non HMO) | Admitting: Lab

## 2012-05-24 ENCOUNTER — Ambulatory Visit (HOSPITAL_BASED_OUTPATIENT_CLINIC_OR_DEPARTMENT_OTHER): Payer: Managed Care, Other (non HMO)

## 2012-05-24 VITALS — BP 126/80 | HR 90 | Temp 98.4°F | Resp 20 | Ht 68.0 in | Wt 174.2 lb

## 2012-05-24 DIAGNOSIS — Z5111 Encounter for antineoplastic chemotherapy: Secondary | ICD-10-CM

## 2012-05-24 DIAGNOSIS — Z170 Estrogen receptor positive status [ER+]: Secondary | ICD-10-CM

## 2012-05-24 DIAGNOSIS — C50419 Malignant neoplasm of upper-outer quadrant of unspecified female breast: Secondary | ICD-10-CM

## 2012-05-24 LAB — CBC WITH DIFFERENTIAL/PLATELET
Basophils Absolute: 0.1 10*3/uL (ref 0.0–0.1)
EOS%: 2.8 % (ref 0.0–7.0)
HCT: 32.4 % — ABNORMAL LOW (ref 34.8–46.6)
HGB: 10.6 g/dL — ABNORMAL LOW (ref 11.6–15.9)
MCH: 31.3 pg (ref 25.1–34.0)
MCV: 95.6 fL (ref 79.5–101.0)
MONO%: 2.4 % (ref 0.0–14.0)
NEUT%: 82.4 % — ABNORMAL HIGH (ref 38.4–76.8)
Platelets: 185 10*3/uL (ref 145–400)

## 2012-05-24 LAB — COMPREHENSIVE METABOLIC PANEL (CC13)
AST: 25 U/L (ref 5–34)
Alkaline Phosphatase: 165 U/L — ABNORMAL HIGH (ref 40–150)
BUN: 11 mg/dL (ref 7.0–26.0)
Calcium: 9.2 mg/dL (ref 8.4–10.4)
Creatinine: 0.9 mg/dL (ref 0.6–1.1)

## 2012-05-24 MED ORDER — PACLITAXEL CHEMO INJECTION 300 MG/50ML
175.0000 mg/m2 | Freq: Once | INTRAVENOUS | Status: AC
  Administered 2012-05-24: 336 mg via INTRAVENOUS
  Filled 2012-05-24: qty 56

## 2012-05-24 MED ORDER — DEXAMETHASONE SODIUM PHOSPHATE 4 MG/ML IJ SOLN
20.0000 mg | Freq: Once | INTRAMUSCULAR | Status: AC
  Administered 2012-05-24: 20 mg via INTRAVENOUS

## 2012-05-24 MED ORDER — ACETAMINOPHEN 325 MG PO TABS
650.0000 mg | ORAL_TABLET | Freq: Once | ORAL | Status: AC
  Administered 2012-05-24: 650 mg via ORAL

## 2012-05-24 MED ORDER — FAMOTIDINE IN NACL 20-0.9 MG/50ML-% IV SOLN
20.0000 mg | Freq: Once | INTRAVENOUS | Status: AC
  Administered 2012-05-24: 20 mg via INTRAVENOUS

## 2012-05-24 MED ORDER — SODIUM CHLORIDE 0.9 % IV SOLN: Freq: Once | INTRAVENOUS | Status: DC

## 2012-05-24 MED ORDER — PALONOSETRON HCL INJECTION 0.25 MG/5ML
0.2500 mg | Freq: Once | INTRAVENOUS | Status: AC
  Administered 2012-05-24: 0.25 mg via INTRAVENOUS

## 2012-05-24 MED ORDER — TRASTUZUMAB CHEMO INJECTION 440 MG
4.0000 mg/kg | Freq: Once | INTRAVENOUS | Status: AC
  Administered 2012-05-24: 315 mg via INTRAVENOUS
  Filled 2012-05-24: qty 15

## 2012-05-24 MED ORDER — DIPHENHYDRAMINE HCL 50 MG/ML IJ SOLN
25.0000 mg | Freq: Once | INTRAMUSCULAR | Status: AC
  Administered 2012-05-24: 25 mg via INTRAVENOUS

## 2012-05-24 NOTE — Progress Notes (Signed)
ID: Jody Dunn   DOB: 05-29-1976  MR#: [id number]  HYQ#:[id number]  HISTORY OF PRESENT ILLNESS: Jody Dunn had a mammogram at [facility] of 2010 because of shooting pains in the right breast. This found no worrisome finding. More recently, in the spring of 2012, the patient at delivered her second child. She nursed but the child refused to take milk from the right breast. The patient tells me that she has been able to palpate easily from the right breast, so production is not an issue. More recently, early June 2013, the patient felt a new lump in the right breast, and brought this to the attention of [name] at [facility]. The patient had repeat mammography at [facility] 02/07/2012. This found at the breast tissue to be heterogeneously dense, which is not a surprise given that the patient is lactating. There was diffuse skin thickening. Erythema is not described. The nipple was retracted. There was no definitive peau d'orange appearance. Pleomorphic calcifications in the lateral right breast extended approximately 11 cm, without definite mass. On ultrasound there was a vague irregular hypoechoic mass measuring approximately 15 mm in the right breast, with a second ill-defined hypoechoic lobulated mass measuring approximately 17 mm. Both these masses were biopsied 02/09/2012, and the final pathology ([id number]) showed both IIb invasive ductal carcinoma, with a prognostic panel (from the mass at 11:00) as follows: estrogen receptor positive at 90%, progesterone receptor positive at 90%, Mib-1 of 30%, and HER-2 amplification with a ratio of 4.29 by CISH. MRI of the breast was performed 02/15/2012 and showed the mass in the 10:00 position to measure 3.1 cm, the one at the 11:00 position to measure 3.1 cm. There was diffuse skin thickening in the right breast and 2 enhancing abnormal-appearing lower right axillary lymph nodes measuring 2.0 and 1.8 cm respectively. There was no evidence for internal mammary  adenopathy, left axillary adenopathy, or significant findings in the left breast.  INTERVAL HISTORY: Jody Dunn returns today for followup of her locally advanced right breast cancer, due for day 1 cycle 3 of 4 planned dose dense cycles of paclitaxel/trastuzumab.  She continues to tolerate treatment very well, and interval history is generally unremarkable. She did meet with a plastic surgeon in [place] last week and is seriously considering having her reconstruction done they are.  REVIEW OF SYSTEMS: Jody Dunn is feeling well today. She has had a sinusitis and has completed a Z-Pak since her last visit here. She has some residual postnasal drip and sinus congestion, but this is improving. She's had no fevers, chills, or night sweats. She denies any pleurisy or shortness of breath.  She's had no unusual myalgias or arthralgias, no peripheral swelling, and denies any signs of numbness or tingling in the upper or lower extremities. She denies any recent nausea or emesis. She's had no change in bowel habits. No chest pain or palpitations. No abnormal headaches or dizziness.  A detailed review of systems is otherwise noncontributory.   PAST MEDICAL HISTORY: Past Medical History  Diagnosis Date  . Iron (Fe) deficiency anemia   . Asthma     exercise induced  . Breast cancer   . Back pain   . Migraines   . Lactose intolerance   . Hyperlipidemia     PAST SURGICAL HISTORY: Past Surgical History  Procedure Date  . Appendectomy   . Shoulder surgery   . Knee surgery     X2  . Portacath placement 02/21/2012    Procedure: INSERTION PORT-A-CATH;  Surgeon:  Almond Lint, MD;  Location: [facility];  Service: General;  Laterality: N/A;  . Skin biopsy 02/21/2012    Procedure: BIOPSY SKIN;  Surgeon: Almond Lint, MD;  Location: [facility];  Service: General;  Laterality: Right;    FAMILY HISTORY Family History  Problem Relation Age of Onset  . Cancer Paternal Aunt 65    Breast cancer (half sibling, related  through father)  . Spina bifida Paternal Uncle 0    died around 17-90 months of age  . Cancer Maternal Grandmother 60    colon cancer  . Dementia Maternal Grandfather   . Cancer Paternal Grandmother     breast cancer between 11-42  . Cancer Paternal Grandfather     brain cancer  . Cancer Other     Paternal grandmother's siblings had breast cancer and pancreatic cancer  . Cancer Other 68    maternal great grandmother with breast cancer   the patient's parents are alive, in their early 11s. The patient is an only child. There is a significant family history of cancer including breast, brain, and colon, detailed in our genetic counselors report. BRCA and P. 53 testing is negative  GYNECOLOGIC HISTORY: Menarche age 73; she is GX P2, first live birth age 74.  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 Her husband Jody Dunn "Jody Dunn" Jody Dunn. works as a Furniture conservator/restorer for [employer]. Their children are Jody Dunn (2008) and Jody Dunn (2012).   ADVANCED DIRECTIVES: not in place  HEALTH MAINTENANCE: History  Substance Use Topics  . Smoking status: Never Smoker   . Smokeless tobacco: Never Used  . Alcohol Use: 2.5 - 3.5 oz/week    5-7 drink(s) per week     OCCASIONAL     Colonoscopy: no  PAP: 2012  Bone density: no  Lipid panel:  Allergies  Allergen Reactions  . Zofran (Ondansetron Hcl) Other (See Comments)    Migraine headaches  . Antiseptic Products, Misc.   [name] (Antiseptic Products, Misc.) Rash  . Penicillins Rash    Current Outpatient Prescriptions  Medication Sig Dispense Refill  . dexamethasone (DECADRON) 4 MG tablet Take 8 mg by mouth 2 (two) times daily with a meal.      . HYDROcodone-acetaminophen (NORCO/VICODIN) 5-325 MG per tablet       . lidocaine-prilocaine (EMLA) cream       . LORazepam (ATIVAN) 0.5 MG tablet        . metoCLOPramide (REGLAN) 10 MG tablet Take 1 tablet (10 mg total) by mouth 4 (four) times daily as needed.  30 tablet  3  . omeprazole (PRILOSEC) 20 MG capsule Take 1 capsule (20 mg total) by mouth daily.  60 capsule  4  . Probiotic Product (PROBIOTIC & ACIDOPHILUS EX ST) CAPS Take 1 capsule by mouth daily.        . promethazine (PHENERGAN) 25 MG tablet Take 25 mg by mouth every 6 (six) hours as needed.      . SUMAtriptan (IMITREX) 50 MG tablet Take 1 tablet (50 mg total) by mouth as directed.  10 tablet  6  . zaleplon (SONATA) 10 MG capsule         OBJECTIVE: Young white woman who appears well ECOG: 0 Filed Vitals:   05/24/12 0852  BP: 126/80  Pulse: 90  Temp: 98.4 F (36.9 C)  Resp: 20  Filed Weights   05/24/12 0852  Weight: 174 lb 3.2 oz (79.017 kg)   HEENT:  Sclerae anicteric.  Oropharynx clear.   Nodes:  No cervical or supraclavicular lymphadenopathy palpated Breast Exam: Unable to palpate a distinct mass in the right breast today. No obvious skin changes. Left breast is unremarkable. Axillae are benign bilaterally, no adenopathy  Lungs:  Clear to auscultation bilaterally.  No crackles, rhonchi, or wheezes.   Heart:  Regular rate and rhythm.   Abdomen:  Soft, nontender.  Positive bowel sounds.   Extremities:  No peripheral edema   Neuro:  Nonfocal.  Alert and oriented x3.    LAB RESULTS: Lab Results  Component Value Date   WBC 16.2* 05/24/2012   NEUTROABS 13.3* 05/24/2012   HGB 10.6* 05/24/2012   HCT 32.4* 05/24/2012   MCV 95.6 05/24/2012   PLT 185 05/24/2012       Chemistry      Component Value Date/Time   NA 138 05/24/2012 0823   NA 137 04/12/2012 0913   K 3.9 05/24/2012 0823   K 3.7 04/12/2012 0913   CL 105 05/24/2012 0823   CL 102 04/12/2012 0913   CO2 22 05/24/2012 0823   CO2 27 04/12/2012 0913   BUN 11.0 05/24/2012 0823   BUN 8 04/12/2012 0913   CREATININE 0.9 05/24/2012 0823   CREATININE 0.77 04/12/2012 0913      Component Value Date/Time   CALCIUM 9.2  05/24/2012 0823   CALCIUM 9.1 04/12/2012 0913   ALKPHOS 165* 05/24/2012 0823   ALKPHOS 122* 04/12/2012 0913   AST 25 05/24/2012 0823   AST 19 04/12/2012 0913   ALT 25 05/24/2012 0823   ALT 15 04/12/2012 0913   BILITOT 0.30 05/24/2012 0823   BILITOT 0.2* 04/12/2012 0913       Lab Results  Component Value Date   LABCA2 28 02/16/2012     STUDIES:  Echocardiogram on 04/21/2012 shows an ejection fraction of 55%.  She will be following up with Dr. Gala Romney within the next couple of weeks.  ASSESSMENT: 36 y.o. BRCA-negative Jody Dunn woman   (1)  status post right breast biopsy 02/09/2012 for multifocal pT4 cN1, stage IIIB invasive ductal carcinoma. Prognostic profile from one of the 2 masses shows it to be triple positive with an MIB-1 of 30%.   (2)  s/p 4 dose dense cycles of doxorubicin/ cyclophosphamide, followed by 4 cycles of paclitaxel, dose-dense, given with trastuzumab (ongoing)   PLAN:  Jody Dunn will receive her third dose of paclitaxel/trastuzumab today as scheduled. She'll receive Neulasta tomorrow, and I will see her next week for assessment of chemotoxicity. In the meanwhile, she is already scheduled to meet with her surgeon, Dr. Donell Beers, later this week. We are not going to repeat an MRI preop, since she is quite clear on her decision to have bilateral mastectomies.   Jody Dunn voices understanding and agreement with our plan, and call with any changes or problems.   Jody Dunn    05/24/2012

## 2012-05-24 NOTE — Patient Instructions (Signed)
No change in plan.  Return next week for labs and follow up

## 2012-05-24 NOTE — Patient Instructions (Addendum)
[facility] Discharge Instructions for Patients Receiving Chemotherapy  Today you received the following chemotherapy agents: Taxol and Herceptin.  To help prevent nausea and vomiting after your treatment, we encourage you to take your nausea medication.   If you develop nausea and vomiting that is not controlled by your nausea medication, call the clinic.   BELOW ARE SYMPTOMS THAT SHOULD BE REPORTED IMMEDIATELY:  *FEVER GREATER THAN 100.5 F  *CHILLS WITH OR WITHOUT FEVER  NAUSEA AND VOMITING THAT IS NOT CONTROLLED WITH YOUR NAUSEA MEDICATION  *UNUSUAL SHORTNESS OF BREATH  *UNUSUAL BRUISING OR BLEEDING  TENDERNESS IN MOUTH AND THROAT WITH OR WITHOUT PRESENCE OF ULCERS  *URINARY PROBLEMS  *BOWEL PROBLEMS  UNUSUAL RASH Items with * indicate a potential emergency and should be followed up as soon as possible.  The clinic phone number is [phone].

## 2012-05-25 ENCOUNTER — Ambulatory Visit (HOSPITAL_BASED_OUTPATIENT_CLINIC_OR_DEPARTMENT_OTHER): Payer: Managed Care, Other (non HMO)

## 2012-05-25 VITALS — BP 122/65 | HR 78 | Temp 97.6°F | Resp 16

## 2012-05-25 DIAGNOSIS — Z5189 Encounter for other specified aftercare: Secondary | ICD-10-CM

## 2012-05-25 DIAGNOSIS — C50419 Malignant neoplasm of upper-outer quadrant of unspecified female breast: Secondary | ICD-10-CM

## 2012-05-25 MED ORDER — PEGFILGRASTIM INJECTION 6 MG/0.6ML
6.0000 mg | Freq: Once | SUBCUTANEOUS | Status: AC
  Administered 2012-05-25: 6 mg via SUBCUTANEOUS

## 2012-05-26 ENCOUNTER — Ambulatory Visit (INDEPENDENT_AMBULATORY_CARE_PROVIDER_SITE_OTHER): Payer: Managed Care, Other (non HMO) | Admitting: General Surgery

## 2012-05-26 ENCOUNTER — Encounter (INDEPENDENT_AMBULATORY_CARE_PROVIDER_SITE_OTHER): Payer: Self-pay | Admitting: General Surgery

## 2012-05-26 VITALS — BP 118/60 | HR 92 | Temp 97.1°F | Resp 20 | Ht 68.0 in | Wt 174.0 lb

## 2012-05-26 DIAGNOSIS — C50419 Malignant neoplasm of upper-outer quadrant of unspecified female breast: Secondary | ICD-10-CM

## 2012-05-26 NOTE — Patient Instructions (Signed)
DO NOT TAKE THESE MEDICATIONS OR IBUPROFEN/NAPROXEN WITHIN A WEEK BEFORE SURGERY.  The main risks of surgery are bleeding, infection, damage to other structures, and seroma (accumulation of fluid) under the incision site(s).    These complications may lead to additional procedures such as drainage of seroma/infection.     We do provide patients with a Breast Binder.  The purpose of this is to avoid the use of tape on the sensitive tissue of the breast and to provide some compression to minimize the risk of seroma.  If the binder is uncomfortable, you may find that a tank top with a built-in shelf bra or a loose sports bra works better for you.  I recommend wearing this around the clock for the first 1-2 weeks except in the shower.    You may not shower until drains are removed.    Many patients have some constipation in the week after surgery due to the narcotics and anesthesia.  You may need over the counter stool softeners or laxatives if you experience difficulty having bowel movements.    If the following occur, call our office at [phone]: If you have a fever over 101 or pain that is severe despite narcotics. If you have redness or drainage at the wound. If you develop persistent nausea or vomiting.  I will follow you back up in 1-4 weeks.    Please submit any paperwork about time off work/insurance forms to the front desk.

## 2012-05-26 NOTE — Assessment & Plan Note (Addendum)
Pt has had dramatic response to chemo with skin improvement to baseline, mass has disappeared.    Plan right MRM, Left simple mastectomy. Pt had clinically positive lymph nodes preoperatively.  Will do ALND on the cancer. Side.    Pt to get delayed reconstruction at [facility]  Spend 25 minutes discussing plan, risks and benefits of surgery.  Risks include bleeding, infection, pain, possible need for additional procedures. Discussed recovery time and lifting restrictions.  Discussed drains and no shower with drains in.

## 2012-05-31 ENCOUNTER — Other Ambulatory Visit (HOSPITAL_BASED_OUTPATIENT_CLINIC_OR_DEPARTMENT_OTHER): Payer: Managed Care, Other (non HMO) | Admitting: Lab

## 2012-05-31 ENCOUNTER — Encounter: Payer: Self-pay | Admitting: Physician Assistant

## 2012-05-31 ENCOUNTER — Ambulatory Visit (HOSPITAL_BASED_OUTPATIENT_CLINIC_OR_DEPARTMENT_OTHER): Payer: Managed Care, Other (non HMO) | Admitting: Physician Assistant

## 2012-05-31 VITALS — BP 137/87 | HR 101 | Temp 98.7°F | Resp 20 | Ht 68.0 in | Wt 175.1 lb

## 2012-05-31 DIAGNOSIS — Z170 Estrogen receptor positive status [ER+]: Secondary | ICD-10-CM

## 2012-05-31 DIAGNOSIS — R21 Rash and other nonspecific skin eruption: Secondary | ICD-10-CM

## 2012-05-31 DIAGNOSIS — C50419 Malignant neoplasm of upper-outer quadrant of unspecified female breast: Secondary | ICD-10-CM

## 2012-05-31 LAB — CBC WITH DIFFERENTIAL/PLATELET
BASO%: 0.4 % (ref 0.0–2.0)
LYMPH%: 6.7 % — ABNORMAL LOW (ref 14.0–49.7)
MCHC: 33.3 g/dL (ref 31.5–36.0)
MONO#: 2.4 10*3/uL — ABNORMAL HIGH (ref 0.1–0.9)
MONO%: 7.8 % (ref 0.0–14.0)
Platelets: 332 10*3/uL (ref 145–400)
RBC: 3.18 10*6/uL — ABNORMAL LOW (ref 3.70–5.45)
WBC: 30.6 10*3/uL — ABNORMAL HIGH (ref 3.9–10.3)

## 2012-05-31 NOTE — Progress Notes (Signed)
ID: Jody Dunn   DOB: 1976/03/28  MR#: [id number]  [id number]#:[id number]  HISTORY OF PRESENT ILLNESS: Jody Dunn had a mammogram at [facility] of 2010 because of shooting pains in the right breast. This found no worrisome finding. More recently, in the spring of 2012, the patient at delivered her second child. She nursed but the child refused to take milk from the right breast. The patient tells me that she has been able to palpate easily from the right breast, so production is not an issue. More recently, early June 2013, the patient felt a new lump in the right breast, and brought this to the attention of Jody Dunn at [facility]. The patient had repeat mammography at [facility] 02/07/2012. This found at the breast tissue to be heterogeneously dense, which is not a surprise given that the patient is lactating. There was diffuse skin thickening. Erythema is not described. The nipple was retracted. There was no definitive peau d'orange appearance. Pleomorphic calcifications in the lateral right breast extended approximately 11 cm, without definite mass. On ultrasound there was a vague irregular hypoechoic mass measuring approximately 15 mm in the right breast, with a second ill-defined hypoechoic lobulated mass measuring approximately 17 mm. Both these masses were biopsied 02/09/2012, and the final pathology ([id number]) showed both IIb invasive ductal carcinoma, with a prognostic panel (from the mass at 11:00) as follows: estrogen receptor positive at 90%, progesterone receptor positive at 90%, Mib-1 of 30%, and HER-2 amplification with a ratio of 4.29 by CISH. MRI of the breast was performed 02/15/2012 and showed the mass in the 10:00 position to measure 3.1 cm, the one at the 11:00 position to measure 3.1 cm. There was diffuse skin thickening in the right breast and 2 enhancing abnormal-appearing lower right axillary lymph nodes measuring 2.0 and 1.8 cm respectively. There was no evidence for internal mammary  adenopathy, left axillary adenopathy, or significant findings in the left breast.  INTERVAL HISTORY: Jody Dunn returns today for followup of her locally advanced right breast cancer, currently day 8 cycle 3 of 4 planned dose dense cycles of paclitaxel/trastuzumab.  She continues to tolerate treatment very well, and interval history is generally unremarkable.   REVIEW OF SYSTEMS: Jody Dunn has had no fevers, chills, or night sweats. She's noted an area of redness on one of the fingers of her right hand where she usually wears a ring. The rash is very localized and she attributes it to "some type of allergic reaction". She has occasional cough she associates with postnasal drip. She denies any phlegm production .  She denies any pleurisy or shortness of breath.  No chest pain or palpitations. She has had no peripheral swelling, and denies any signs of numbness or tingling in the upper or lower extremities. She denies any recent nausea or emesis. She's had no change in bowel habits. No abnormal headaches or dizziness. She develops bony pain following the Jody Dunn injection and this lasts for 3-4 days following treatment. At this time, she denies any unusual myalgias, arthralgias, or bony pain.  A detailed review of systems is otherwise noncontributory.   PAST MEDICAL HISTORY: Past Medical History  Diagnosis Date  . Iron (Fe) deficiency anemia   . Asthma     exercise induced  . Breast cancer   . Back pain   . Migraines   . Lactose intolerance   . Hyperlipidemia     PAST SURGICAL HISTORY: Past Surgical History  Procedure Date  . Appendectomy   . Shoulder surgery   .  Knee surgery     X2  . Portacath placement 02/21/2012    Procedure: INSERTION PORT-A-CATH;  Surgeon: Jody Lint, MD;  Location: [facility];  Service: General;  Laterality: N/A;  . Skin biopsy 02/21/2012    Procedure: BIOPSY SKIN;  Surgeon: Jody Lint, MD;  Location: [facility];  Service: General;  Laterality: Right;    FAMILY  HISTORY Family History  Problem Relation Age of Onset  . Cancer Paternal Aunt 37    Breast cancer (half sibling, related through father)  . Spina bifida Paternal Uncle 0    died around 36-65 months of age  . Cancer Maternal Grandmother 15    colon cancer  . Dementia Maternal Grandfather   . Cancer Paternal Grandmother     breast cancer between 37-42  . Cancer Paternal Grandfather     brain cancer  . Cancer Other     Paternal grandmother's siblings had breast cancer and pancreatic cancer  . Cancer Other 61    maternal great grandmother with breast cancer   the patient's parents are alive, in their early 91s. The patient is an only child. There is a significant family history of cancer including breast, brain, and colon, detailed in our genetic counselors report. BRCA and P. 53 testing is negative  GYNECOLOGIC HISTORY: Menarche age 31; she is GX P2, first live birth age 89.  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 Her husband Jody Dunn. works as a Furniture conservator/restorer for [employer]. Their children are Jody Dunn (2008) and Jody Dunn (2012).   ADVANCED DIRECTIVES: not in place  HEALTH MAINTENANCE: History  Substance Use Topics  . Smoking status: Never Smoker   . Smokeless tobacco: Never Used  . Alcohol Use: 2.5 - 3.5 oz/week    5-7 drink(s) per week     OCCASIONAL     Colonoscopy: no  PAP: 2012  Bone density: no  Lipid panel:  Allergies  Allergen Reactions  . Zofran (Ondansetron Hcl) Other (See Comments)    Migraine headaches  . Antiseptic Products, Misc.   Jody Dunn (Antiseptic Products, Misc.) Rash  . Penicillins Rash    Current Outpatient Prescriptions  Medication Sig Dispense Refill  . dexamethasone (DECADRON) 4 MG tablet Take 8 mg by mouth 2 (two) times daily with a meal.      . HYDROcodone-acetaminophen  (NORCO/VICODIN) 5-325 MG per tablet       . lidocaine-prilocaine (EMLA) cream       . LORazepam (ATIVAN) 0.5 MG tablet       . metoCLOPramide (REGLAN) 10 MG tablet Take 1 tablet (10 mg total) by mouth 4 (four) times daily as needed.  30 tablet  3  . omeprazole (PRILOSEC) 20 MG capsule Take 1 capsule (20 mg total) by mouth daily.  60 capsule  4  . Probiotic Product (PROBIOTIC & ACIDOPHILUS EX ST) CAPS Take 1 capsule by mouth daily.        . promethazine (PHENERGAN) 25 MG tablet Take 25 mg by mouth every 6 (six) hours as needed.      . SUMAtriptan (IMITREX) 50 MG tablet Take 1 tablet (50 mg total) by mouth as directed.  10 tablet  6  . zaleplon (SONATA) 10 MG capsule         OBJECTIVE: Young white woman who appears well  Filed Vitals:  05/31/12 1108  BP: 137/87  Pulse: 101  Temp: 98.7 F (37.1 C)  Resp: 20  ECOG: 0 Filed Weights   05/31/12 1108  Weight: 175 lb 1.6 oz (79.425 kg)   HEENT:  Sclerae anicteric.  Oropharynx clear.   Nodes:  No cervical or supraclavicular lymphadenopathy palpated.  Axillae are benign bilaterally, no adenopathy  Breast Exam:Deferred  Lungs:  Clear to auscultation bilaterally.  No crackles, rhonchi, or wheezes.   Heart:  Regular rate and rhythm.   Abdomen:  Soft, nontender.  Positive bowel sounds.   Extremities:  No peripheral edema   Neuro:  Nonfocal.  Alert and oriented x3. Skin:  Erythematous rash at the base of the third digit, right hand, with no evidence of vesicles or pustules.    LAB RESULTS: Lab Results  Component Value Date   WBC 30.6* 05/31/2012   NEUTROABS 25.5* 05/31/2012   HGB 10.1* 05/31/2012   HCT 30.3* 05/31/2012   MCV 95.3 05/31/2012   PLT 332 05/31/2012       Chemistry      Component Value Date/Time   NA 138 05/24/2012 0823   NA 137 04/12/2012 0913   K 3.9 05/24/2012 0823   K 3.7 04/12/2012 0913   CL 105 05/24/2012 0823   CL 102 04/12/2012 0913   CO2 22 05/24/2012 0823   CO2 27 04/12/2012 0913   BUN 11.0 05/24/2012 0823   BUN 8  04/12/2012 0913   CREATININE 0.9 05/24/2012 0823   CREATININE 0.77 04/12/2012 0913      Component Value Date/Time   CALCIUM 9.2 05/24/2012 0823   CALCIUM 9.1 04/12/2012 0913   ALKPHOS 165* 05/24/2012 0823   ALKPHOS 122* 04/12/2012 0913   AST 25 05/24/2012 0823   AST 19 04/12/2012 0913   ALT 25 05/24/2012 0823   ALT 15 04/12/2012 0913   BILITOT 0.30 05/24/2012 0823   BILITOT 0.2* 04/12/2012 0913       Lab Results  Component Value Date   LABCA2 28 02/16/2012     STUDIES:  Echocardiogram on 04/21/2012 shows an ejection fraction of 55%.     ASSESSMENT: 36 y.o. BRCA-negative [place] woman   (1)  status post right breast biopsy 02/09/2012 for multifocal pT4 cN1, stage IIIB invasive ductal carcinoma. Prognostic profile from one of the 2 masses shows it to be triple positive with an MIB-1 of 30%.   (2)  s/p 4 dose dense cycles of doxorubicin/ cyclophosphamide, followed by 4 cycles of paclitaxel, dose-dense, given with trastuzumab (ongoing)   PLAN:  Brayah will return next week for her fourth and final dose of neoadjuvant paclitaxol given with trastuzumab. She will see Dr. Darnelle Catalan the following week for assessment and is planning to proceed with her bilateral mastectomies in early November.  We are not going to repeat an MRI preop, since she is quite clear on her decision to have bilateral mastectomies. Of course the trastuzumab will be continued for total of one year. Her next echocardiogram will be due in late November.  In the meanwhile, I recommended she try some topical cortisone cream on the rash on her finger, but she will call us if this worsens.  Jody Dunn voices understanding and agreement with our plan, and call with any changes or problems.   Jody Dunn    05/31/2012

## 2012-06-07 ENCOUNTER — Ambulatory Visit (HOSPITAL_BASED_OUTPATIENT_CLINIC_OR_DEPARTMENT_OTHER): Payer: Managed Care, Other (non HMO)

## 2012-06-07 ENCOUNTER — Encounter: Payer: Self-pay | Admitting: Physician Assistant

## 2012-06-07 ENCOUNTER — Other Ambulatory Visit (HOSPITAL_BASED_OUTPATIENT_CLINIC_OR_DEPARTMENT_OTHER): Payer: Managed Care, Other (non HMO)

## 2012-06-07 ENCOUNTER — Ambulatory Visit (HOSPITAL_BASED_OUTPATIENT_CLINIC_OR_DEPARTMENT_OTHER): Payer: Managed Care, Other (non HMO) | Admitting: Physician Assistant

## 2012-06-07 ENCOUNTER — Telehealth: Payer: Self-pay | Admitting: *Deleted

## 2012-06-07 VITALS — BP 133/81 | HR 86 | Temp 98.3°F | Resp 20 | Ht 68.0 in | Wt 178.2 lb

## 2012-06-07 DIAGNOSIS — C50419 Malignant neoplasm of upper-outer quadrant of unspecified female breast: Secondary | ICD-10-CM

## 2012-06-07 DIAGNOSIS — Z5112 Encounter for antineoplastic immunotherapy: Secondary | ICD-10-CM

## 2012-06-07 DIAGNOSIS — Z5111 Encounter for antineoplastic chemotherapy: Secondary | ICD-10-CM

## 2012-06-07 DIAGNOSIS — Z170 Estrogen receptor positive status [ER+]: Secondary | ICD-10-CM

## 2012-06-07 LAB — COMPREHENSIVE METABOLIC PANEL (CC13)
Alkaline Phosphatase: 151 U/L — ABNORMAL HIGH (ref 40–150)
BUN: 19 mg/dL (ref 7.0–26.0)
CO2: 21 mEq/L — ABNORMAL LOW (ref 22–29)
Glucose: 111 mg/dl — ABNORMAL HIGH (ref 70–99)
Total Bilirubin: 0.4 mg/dL (ref 0.20–1.20)

## 2012-06-07 LAB — CBC WITH DIFFERENTIAL/PLATELET
Basophils Absolute: 0.1 10*3/uL (ref 0.0–0.1)
Eosinophils Absolute: 0.2 10*3/uL (ref 0.0–0.5)
HCT: 31.1 % — ABNORMAL LOW (ref 34.8–46.6)
HGB: 10.3 g/dL — ABNORMAL LOW (ref 11.6–15.9)
LYMPH%: 12.9 % — ABNORMAL LOW (ref 14.0–49.7)
MCV: 98.1 fL (ref 79.5–101.0)
MONO#: 0.4 10*3/uL (ref 0.1–0.9)
MONO%: 2.8 % (ref 0.0–14.0)
NEUT#: 12.2 10*3/uL — ABNORMAL HIGH (ref 1.5–6.5)
NEUT%: 82.2 % — ABNORMAL HIGH (ref 38.4–76.8)
Platelets: 189 10*3/uL (ref 145–400)

## 2012-06-07 MED ORDER — PALONOSETRON HCL INJECTION 0.25 MG/5ML
0.2500 mg | Freq: Once | INTRAVENOUS | Status: AC
  Administered 2012-06-07: 0.25 mg via INTRAVENOUS

## 2012-06-07 MED ORDER — TRASTUZUMAB CHEMO INJECTION 440 MG
4.0000 mg/kg | Freq: Once | INTRAVENOUS | Status: AC
  Administered 2012-06-07: 315 mg via INTRAVENOUS
  Filled 2012-06-07: qty 15

## 2012-06-07 MED ORDER — DEXAMETHASONE SODIUM PHOSPHATE 4 MG/ML IJ SOLN
20.0000 mg | Freq: Once | INTRAMUSCULAR | Status: AC
  Administered 2012-06-07: 20 mg via INTRAVENOUS

## 2012-06-07 MED ORDER — DIPHENHYDRAMINE HCL 50 MG/ML IJ SOLN
25.0000 mg | Freq: Once | INTRAMUSCULAR | Status: AC
  Administered 2012-06-07: 25 mg via INTRAVENOUS

## 2012-06-07 MED ORDER — FAMOTIDINE IN NACL 20-0.9 MG/50ML-% IV SOLN
20.0000 mg | Freq: Once | INTRAVENOUS | Status: AC
  Administered 2012-06-07: 20 mg via INTRAVENOUS

## 2012-06-07 MED ORDER — PACLITAXEL CHEMO INJECTION 300 MG/50ML
175.0000 mg/m2 | Freq: Once | INTRAVENOUS | Status: AC
  Administered 2012-06-07: 336 mg via INTRAVENOUS
  Filled 2012-06-07: qty 56

## 2012-06-07 MED ORDER — ACETAMINOPHEN 325 MG PO TABS: 650.0000 mg | ORAL_TABLET | Freq: Once | ORAL | Status: DC

## 2012-06-07 NOTE — Telephone Encounter (Signed)
Made patient appointment for 07-26-2012 lab and md starting at 9:00am  Made patient appointment for 06-26-2012 at 2:00pm

## 2012-06-07 NOTE — Patient Instructions (Signed)
Jody Dunn [birth year] [id number]  [facility] Discharge Instructions for Patients Receiving Chemotherapy  Today you received the following chemotherapy agents: Taxol, Herceptin   To help prevent nausea and vomiting after your treatment, we encourage you to take your nausea medication as prescribed; if you develop nausea and vomiting that is not controlled by your nausea medication, call the clinic.   BELOW ARE SYMPTOMS THAT SHOULD BE REPORTED IMMEDIATELY:  *FEVER GREATER THAN 100.5 F *CHILLS WITH OR WITHOUT FEVER *UNUSUAL SHORTNESS OF BREATH *UNUSUAL BRUISING OR BLEEDING *URINARY PROBLEMS *BOWEL PROBLEMS  I have been informed and understand all the instructions given to me.   Please call the [facility] at [phone] during business hours should you have any further questions or need assistance in obtaining follow-up care. If you have a medical emergency, please dial 911.

## 2012-06-07 NOTE — Progress Notes (Signed)
ID: Jody Dunn   DOB: 12-05-1975  MR#: [id number]  WUJ#:[id number]  HISTORY OF PRESENT ILLNESS: Jody Dunn had a mammogram at [facility] of 2010 because of shooting pains in the right breast. This found no worrisome finding. More recently, in the spring of 2012, the patient at delivered her second child. She nursed but the child refused to take milk from the right breast. The patient tells me that she has been able to palpate easily from the right breast, so production is not an issue. More recently, early June 2013, the patient felt a new lump in the right breast, and brought this to the attention of [name] at [facility]. The patient had repeat mammography at [facility] 02/07/2012. This found at the breast tissue to be heterogeneously dense, which is not a surprise given that the patient is lactating. There was diffuse skin thickening. Erythema is not described. The nipple was retracted. There was no definitive peau d'orange appearance. Pleomorphic calcifications in the lateral right breast extended approximately 11 cm, without definite mass. On ultrasound there was a vague irregular hypoechoic mass measuring approximately 15 mm in the right breast, with a second ill-defined hypoechoic lobulated mass measuring approximately 17 mm. Both these masses were biopsied 02/09/2012, and the final pathology ([id number]) showed both IIb invasive ductal carcinoma, with a prognostic panel (from the mass at 11:00) as follows: estrogen receptor positive at 90%, progesterone receptor positive at 90%, Mib-1 of 30%, and HER-2 amplification with a ratio of 4.29 by CISH. MRI of the breast was performed 02/15/2012 and showed the mass in the 10:00 position to measure 3.1 cm, the one at the 11:00 position to measure 3.1 cm. There was diffuse skin thickening in the right breast and 2 enhancing abnormal-appearing lower right axillary lymph nodes measuring 2.0 and 1.8 cm respectively. There was no evidence for internal mammary  adenopathy, left axillary adenopathy, or significant findings in the left breast.  INTERVAL HISTORY: Jody Dunn returns today for followup of her locally advanced right breast cancer.  She is due for day 1 cycle 4 of 4 planned dose dense cycles of paclitaxel/trastuzumab.  She continues to tolerate treatment very well, and interval history is generally unremarkable.   REVIEW OF SYSTEMS: Jody Dunn has had no fevers, chills, or night sweats.  She has occasional cough she associates with postnasal drip. She denies any phlegm production .  She denies any pleurisy or shortness of breath.  No chest pain or palpitations. She denies any signs of numbness or tingling in the upper or lower extremities. She's had no actual swelling, but feels a little bloated and "puffy" today. She admits the she's not been drinking as much water as usual. She denies any recent nausea or emesis. She's had no change in bowel habits. No abnormal headaches or dizziness.  At this time, she denies any unusual myalgias, arthralgias, or bony pain.  A detailed review of systems is otherwise noncontributory.   PAST MEDICAL HISTORY: Past Medical History  Diagnosis Date  . Iron (Fe) deficiency anemia   . Asthma     exercise induced  . Breast cancer   . Back pain   . Migraines   . Lactose intolerance   . Hyperlipidemia     PAST SURGICAL HISTORY: Past Surgical History  Procedure Date  . Appendectomy   . Shoulder surgery   . Knee surgery     X2  . Portacath placement 02/21/2012    Procedure: INSERTION PORT-A-CATH;  Surgeon: Almond Lint, MD;  Location:  [facility];  Service: General;  Laterality: N/A;  . Skin biopsy 02/21/2012    Procedure: BIOPSY SKIN;  Surgeon: Almond Lint, MD;  Location: [facility];  Service: General;  Laterality: Right;    FAMILY HISTORY Family History  Problem Relation Age of Onset  . Cancer Paternal Aunt 24    Breast cancer (half sibling, related through father)  . Spina bifida Paternal Uncle 0    died around 22-37  months of age  . Cancer Maternal Grandmother 14    colon cancer  . Dementia Maternal Grandfather   . Cancer Paternal Grandmother     breast cancer between 41-42  . Cancer Paternal Grandfather     brain cancer  . Cancer Other     Paternal grandmother's siblings had breast cancer and pancreatic cancer  . Cancer Other 20    maternal great grandmother with breast cancer   the patient's parents are alive, in their early 40s. The patient is an only child. There is a significant family history of cancer including breast, brain, and colon, detailed in our genetic counselors report. BRCA and P. 53 testing is negative  GYNECOLOGIC HISTORY: Menarche age 41; she is GX P2, first live birth age 22.  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 works as a Furniture conservator/restorer for [employer]. Their children are Jody Dunn (2008) and Jody Dunn (2012).   ADVANCED DIRECTIVES: not in place  HEALTH MAINTENANCE: History  Substance Use Topics  . Smoking status: Never Smoker   . Smokeless tobacco: Never Used  . Alcohol Use: 2.5 - 3.5 oz/week    5-7 drink(s) per week     OCCASIONAL     Colonoscopy: no  PAP: 2012  Bone density: no  Lipid panel:  Allergies  Allergen Reactions  . Zofran (Ondansetron Hcl) Other (See Comments)    Migraine headaches  . Antiseptic Products, Misc.   [name] (Antiseptic Products, Misc.) Rash  . Penicillins Rash    Current Outpatient Prescriptions  Medication Sig Dispense Refill  . dexamethasone (DECADRON) 4 MG tablet Take 8 mg by mouth 2 (two) times daily with a meal.      . HYDROcodone-acetaminophen (NORCO/VICODIN) 5-325 MG per tablet       . lidocaine-prilocaine (EMLA) cream       . LORazepam (ATIVAN) 0.5 MG tablet       . metoCLOPramide (REGLAN) 10 MG tablet Take 1 tablet (10 mg total)  by mouth 4 (four) times daily as needed.  30 tablet  3  . omeprazole (PRILOSEC) 20 MG capsule Take 1 capsule (20 mg total) by mouth daily.  60 capsule  4  . Probiotic Product (PROBIOTIC & ACIDOPHILUS EX ST) CAPS Take 1 capsule by mouth daily.        . promethazine (PHENERGAN) 25 MG tablet Take 25 mg by mouth every 6 (six) hours as needed.      . SUMAtriptan (IMITREX) 50 MG tablet Take 1 tablet (50 mg total) by mouth as directed.  10 tablet  6  . zaleplon (SONATA) 10 MG capsule         OBJECTIVE: Young white woman who appears well and in no acute distress Filed Vitals:   06/07/12 0844  BP: 133/81  Pulse: 86  Temp: 98.3 F (36.8 C)  Resp: 20  ECOG: 0 Filed  Weights   06/07/12 0844  Weight: 178 lb 3.2 oz (80.831 kg)   HEENT:  Sclerae anicteric.  Oropharynx clear.   Nodes:  No cervical or supraclavicular lymphadenopathy palpated.  Axillae are benign bilaterally, no adenopathy  Breast Exam:Deferred  Lungs:  Clear to auscultation bilaterally.  No crackles, rhonchi, or wheezes.   Heart:  Regular rate and rhythm.   Abdomen:  Soft, nontender.  Positive bowel sounds.   Extremities:  No peripheral edema   Neuro:  Nonfocal.  Alert and oriented x3.   LAB RESULTS: Lab Results  Component Value Date   WBC 14.8* 06/07/2012   NEUTROABS 12.2* 06/07/2012   HGB 10.3* 06/07/2012   HCT 31.1* 06/07/2012   MCV 98.1 06/07/2012   PLT 189 06/07/2012       Chemistry      Component Value Date/Time   NA 139 06/07/2012 0826   NA 137 04/12/2012 0913   K 3.9 06/07/2012 0826   K 3.7 04/12/2012 0913   CL 106 06/07/2012 0826   CL 102 04/12/2012 0913   CO2 21* 06/07/2012 0826   CO2 27 04/12/2012 0913   BUN 19.0 06/07/2012 0826   BUN 8 04/12/2012 0913   CREATININE 0.9 06/07/2012 0826   CREATININE 0.77 04/12/2012 0913      Component Value Date/Time   CALCIUM 9.4 06/07/2012 0826   CALCIUM 9.1 04/12/2012 0913   ALKPHOS 151* 06/07/2012 0826   ALKPHOS 122* 04/12/2012 0913   AST 24 06/07/2012 0826   AST  19 04/12/2012 0913   ALT 27 06/07/2012 0826   ALT 15 04/12/2012 0913   BILITOT 0.40 06/07/2012 0826   BILITOT 0.2* 04/12/2012 0913       Lab Results  Component Value Date   LABCA2 28 02/16/2012     STUDIES:  Echocardiogram on 04/21/2012 shows an ejection fraction of 55%.     ASSESSMENT: 36 y.o. BRCA-negative [place] woman   (1)  status post right breast biopsy 02/09/2012 for multifocal pT4 cN1, stage IIIB invasive ductal carcinoma. Prognostic profile from one of the 2 masses shows it to be triple positive with an MIB-1 of 30%.   (2)  s/p 4 dose dense cycles of doxorubicin/ cyclophosphamide, followed by 4 cycles of paclitaxel, dose-dense, given with trastuzumab (ongoing)   PLAN:  Rabecka will proceed to treatment today as scheduled for her  fourth and final dose of neoadjuvant paclitaxol given with trastuzumab. I will see her again next week for assessment of chemotoxicity on October 22. She already has her surgery scheduled for November 6, and we'll see Dr. Darnelle Catalan approximately 3-4 weeks after surgery for reevaluation. We are not going to repeat an MRI preop, since she is quite clear on her decision to have bilateral mastectomies.   Of course the trastuzumab will be continued for total of one year. Her next echocardiogram will be due in late November. We will look ahead at Kierstin's schedule when I see her next week and schedule both her trastuzumab appointments and her next echocardiogram.  Marijean Niemann voices understanding and agreement with our plan, and call with any changes or problems.   Ayahna Solazzo    06/07/2012

## 2012-06-08 ENCOUNTER — Ambulatory Visit (HOSPITAL_BASED_OUTPATIENT_CLINIC_OR_DEPARTMENT_OTHER): Payer: Managed Care, Other (non HMO)

## 2012-06-08 ENCOUNTER — Other Ambulatory Visit: Payer: Self-pay | Admitting: Certified Registered Nurse Anesthetist

## 2012-06-08 VITALS — BP 138/75 | HR 82 | Temp 97.4°F | Resp 18

## 2012-06-08 DIAGNOSIS — Z5189 Encounter for other specified aftercare: Secondary | ICD-10-CM

## 2012-06-08 DIAGNOSIS — C50219 Malignant neoplasm of upper-inner quadrant of unspecified female breast: Secondary | ICD-10-CM

## 2012-06-08 DIAGNOSIS — C50419 Malignant neoplasm of upper-outer quadrant of unspecified female breast: Secondary | ICD-10-CM

## 2012-06-08 MED ORDER — PEGFILGRASTIM INJECTION 6 MG/0.6ML
6.0000 mg | Freq: Once | SUBCUTANEOUS | Status: AC
  Administered 2012-06-08: 6 mg via SUBCUTANEOUS
  Filled 2012-06-08: qty 0.6

## 2012-06-13 ENCOUNTER — Encounter (HOSPITAL_COMMUNITY): Payer: Self-pay | Admitting: Pharmacy Technician

## 2012-06-13 ENCOUNTER — Telehealth: Payer: Self-pay | Admitting: Oncology

## 2012-06-13 ENCOUNTER — Other Ambulatory Visit (HOSPITAL_BASED_OUTPATIENT_CLINIC_OR_DEPARTMENT_OTHER): Payer: Managed Care, Other (non HMO) | Admitting: Lab

## 2012-06-13 ENCOUNTER — Ambulatory Visit (HOSPITAL_BASED_OUTPATIENT_CLINIC_OR_DEPARTMENT_OTHER): Payer: Managed Care, Other (non HMO) | Admitting: Physician Assistant

## 2012-06-13 ENCOUNTER — Telehealth: Payer: Self-pay | Admitting: *Deleted

## 2012-06-13 ENCOUNTER — Encounter: Payer: Self-pay | Admitting: Physician Assistant

## 2012-06-13 VITALS — BP 133/83 | HR 86 | Temp 98.3°F | Resp 20 | Ht 68.0 in | Wt 179.0 lb

## 2012-06-13 DIAGNOSIS — C50419 Malignant neoplasm of upper-outer quadrant of unspecified female breast: Secondary | ICD-10-CM

## 2012-06-13 LAB — CBC WITH DIFFERENTIAL/PLATELET
BASO%: 0.4 % (ref 0.0–2.0)
LYMPH%: 10 % — ABNORMAL LOW (ref 14.0–49.7)
MCH: 32.2 pg (ref 25.1–34.0)
MCHC: 32.8 g/dL (ref 31.5–36.0)
MCV: 98 fL (ref 79.5–101.0)
MONO%: 6 % (ref 0.0–14.0)
Platelets: 232 10*3/uL (ref 145–400)
RBC: 3.05 10*6/uL — ABNORMAL LOW (ref 3.70–5.45)

## 2012-06-13 NOTE — Telephone Encounter (Signed)
Per staff message and POF I have scheduled appts.  JMW  

## 2012-06-13 NOTE — Progress Notes (Signed)
ID: Jody Dunn   DOB: 02/08/1976  MR#: [id number]  WUX#:[id number]  HISTORY OF PRESENT ILLNESS: Jody Dunn had a mammogram at [facility] of 2010 because of shooting pains in the right breast. This found no worrisome finding. More recently, in the spring of 2012, the patient at delivered her second child. She nursed but the child refused to take milk from the right breast. The patient tells me that she has been able to palpate easily from the right breast, so production is not an issue. More recently, early June 2013, the patient felt a new lump in the right breast, and brought this to the attention of Jody Dunn at [facility]. The patient had repeat mammography at [facility] 02/07/2012. This found at the breast tissue to be heterogeneously dense, which is not a surprise given that the patient is lactating. There was diffuse skin thickening. Erythema is not described. The nipple was retracted. There was no definitive peau d'orange appearance. Pleomorphic calcifications in the lateral right breast extended approximately 11 cm, without definite mass. On ultrasound there was a vague irregular hypoechoic mass measuring approximately 15 mm in the right breast, with a second ill-defined hypoechoic lobulated mass measuring approximately 17 mm. Both these masses were biopsied 02/09/2012, and the final pathology ([id number]) showed both IIb invasive ductal carcinoma, with a prognostic panel (from the mass at 11:00) as follows: estrogen receptor positive at 90%, progesterone receptor positive at 90%, Mib-1 of 30%, and HER-2 amplification with a ratio of 4.29 by CISH. MRI of the breast was performed 02/15/2012 and showed the mass in the 10:00 position to measure 3.1 cm, the one at the 11:00 position to measure 3.1 cm. There was diffuse skin thickening in the right breast and 2 enhancing abnormal-appearing lower right axillary lymph nodes measuring 2.0 and 1.8 cm respectively. There was no evidence for internal mammary  adenopathy, left axillary adenopathy, or significant findings in the left breast.  INTERVAL HISTORY: Jody Dunn returns today for followup of her locally advanced right breast cancer.  She is currently day 7 cycle 4 of 4 planned dose dense cycles of paclitaxel/trastuzumab.  She continues to tolerate treatment very well, and interval history is generally unremarkable.   REVIEW OF SYSTEMS: Jody Dunn has had no fevers, chills, or night sweats.  She has mild postnasal drip. She denies any significant cough, pleurisy or shortness of breath.  No chest pain or palpitations. For the first time, Jody Dunn began to experience some numbness and tingling in both her fingers and toes just 4 or 5 days ago. Thus far, it is intermittent, and is not affecting any of her day-to-day activities. She describes it as they "tickley" or "tingly" sensation. She's had no swelling.  She denies any recent nausea or emesis. She's had no change in bowel habits. No abnormal headaches or dizziness.  At this time, she denies any unusual myalgias, arthralgias, or bony pain.  A detailed review of systems is otherwise noncontributory.   PAST MEDICAL HISTORY: Past Medical History  Diagnosis Date  . Iron (Fe) deficiency anemia   . Asthma     exercise induced  . Breast cancer   . Back pain   . Migraines   . Lactose intolerance   . Hyperlipidemia     PAST SURGICAL HISTORY: Past Surgical History  Procedure Date  . Appendectomy   . Shoulder surgery   . Knee surgery     X2  . Portacath placement 02/21/2012    Procedure: INSERTION PORT-A-CATH;  Surgeon: Jody Lint,  MD;  Location: [facility];  Service: General;  Laterality: N/A;  . Skin biopsy 02/21/2012    Procedure: BIOPSY SKIN;  Surgeon: Jody Lint, MD;  Location: [facility];  Service: General;  Laterality: Right;    FAMILY HISTORY Family History  Problem Relation Age of Onset  . Cancer Paternal Aunt 85    Breast cancer (half sibling, related through father)  . Spina bifida Paternal  Uncle 0    died around 27-28 months of age  . Cancer Maternal Grandmother 8    colon cancer  . Dementia Maternal Grandfather   . Cancer Paternal Grandmother     breast cancer between 34-42  . Cancer Paternal Grandfather     brain cancer  . Cancer Other     Paternal grandmother's siblings had breast cancer and pancreatic cancer  . Cancer Other 97    maternal great grandmother with breast cancer   the patient's parents are alive, in their early 31s. The patient is an only child. There is a significant family history of cancer including breast, brain, and colon, detailed in our genetic counselors report. BRCA and P. 53 testing is negative  GYNECOLOGIC HISTORY: Menarche age 5; she is GX P2, first live birth age 41.  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 Her husband Jody Ramon Dredge "Ed" Jody Dunn. works as a Furniture conservator/restorer for [employer]. Their children are Jody Dunn (2008) and Jody Dunn (2012).   ADVANCED DIRECTIVES: not in place  HEALTH MAINTENANCE: History  Substance Use Topics  . Smoking status: Never Smoker   . Smokeless tobacco: Never Used  . Alcohol Use: 2.5 - 3.5 oz/week    5-7 drink(s) per week     OCCASIONAL     Colonoscopy: no  PAP: 2012  Bone density: no  Lipid panel:  Allergies  Allergen Reactions  . Zofran (Ondansetron Hcl) Other (See Comments)    Migraine headaches  . Antiseptic Products, Misc.   Jody Dunn (Antiseptic Products, Misc.) Rash  . Penicillins Rash    Current Outpatient Prescriptions  Medication Sig Dispense Refill  . naproxen sodium (ANAPROX) 220 MG tablet Take 220 mg by mouth 2 (two) times daily with a meal.      . Probiotic Product (PROBIOTIC & ACIDOPHILUS EX ST) CAPS Take 1 capsule by mouth daily.          OBJECTIVE: Jody Dunn who appears well and in no acute distress Filed Vitals:    06/13/12 0920  BP: 133/83  Pulse: 86  Temp: 98.3 F (36.8 C)  Resp: 20  ECOG: 0 Filed Weights   06/13/12 0920  Weight: 179 lb (81.194 kg)   HEENT:  Sclerae anicteric.  Oropharynx clear.   Nodes:  No cervical or supraclavicular lymphadenopathy palpated.  Axillae are benign bilaterally, no adenopathy  Breast Exam:Deferred  Lungs:  Clear to auscultation bilaterally.  No crackles, rhonchi, or wheezes.   Heart:  Regular rate and rhythm.   Abdomen:  Soft, nontender.  Positive bowel sounds.   Extremities:  No peripheral edema   Neuro:  Nonfocal.  Alert and oriented x3.   LAB RESULTS: Lab Results  Component Value Date   WBC 23.3* 06/13/2012   NEUTROABS 19.2* 06/13/2012   HGB 9.8* 06/13/2012   HCT 29.9* 06/13/2012   MCV 98.0 06/13/2012   PLT 232 06/13/2012  Chemistry      Component Value Date/Time   NA 139 06/07/2012 0826   NA 137 04/12/2012 0913   K 3.9 06/07/2012 0826   K 3.7 04/12/2012 0913   CL 106 06/07/2012 0826   CL 102 04/12/2012 0913   CO2 21* 06/07/2012 0826   CO2 27 04/12/2012 0913   BUN 19.0 06/07/2012 0826   BUN 8 04/12/2012 0913   CREATININE 0.9 06/07/2012 0826   CREATININE 0.77 04/12/2012 0913      Component Value Date/Time   CALCIUM 9.4 06/07/2012 0826   CALCIUM 9.1 04/12/2012 0913   ALKPHOS 151* 06/07/2012 0826   ALKPHOS 122* 04/12/2012 0913   AST 24 06/07/2012 0826   AST 19 04/12/2012 0913   ALT 27 06/07/2012 0826   ALT 15 04/12/2012 0913   BILITOT 0.40 06/07/2012 0826   BILITOT 0.2* 04/12/2012 0913       Lab Results  Component Value Date   LABCA2 28 02/16/2012     STUDIES:  Echocardiogram on 04/21/2012 shows an ejection fraction of 55%.     ASSESSMENT: 36 y.o. BRCA-negative [place] Dunn   (1)  status post right breast biopsy 02/09/2012 for multifocal pT4 cN1, stage IIIB invasive ductal carcinoma. Prognostic profile from one of the 2 masses shows it to be triple positive with an MIB-1 of 30%.   (2)  s/p 4 dose dense cycles of  doxorubicin/ cyclophosphamide, followed by 4 cycles of paclitaxel, dose-dense, given with trastuzumab (ongoing)   PLAN:  Jody Dunn is thrilled to have completed her neoadjuvant chemotherapy, and is scheduled to proceed to surgery (bilateral mastectomies) on November 6. She'll have a repeat echocardiogram in late November, at which time she will also be returning to continue her trastuzumab. She will receive trastuzumab every 3 weeks to complete a total of one year, continuing until September 2014.  She will also see Dr. Darnelle Catalan in late November to review her pathology results, and review her long-term treatment plan, which we expect to include postmastectomy radiation followed by antiestrogen therapy.  Of note, Jody Dunn has requested that we delete Benadryl from her premeds when she is receiving trastuzumab alone. This should not be a problem as she has had no problems with previous trastuzumab infusions.  Jody Dunn voices understanding and agreement with our plan, and call with any changes or problems.   Jody Dunn    06/13/2012

## 2012-06-13 NOTE — Telephone Encounter (Signed)
gve the pt her nov,dec 2013 appt calendars. Pt is aware that we will add the chemo appts. Sent michelle a staff message to add the chemo appts.

## 2012-06-14 ENCOUNTER — Other Ambulatory Visit: Payer: Managed Care, Other (non HMO) | Admitting: Lab

## 2012-06-14 ENCOUNTER — Ambulatory Visit: Payer: Managed Care, Other (non HMO) | Admitting: Oncology

## 2012-06-14 ENCOUNTER — Ambulatory Visit: Payer: Managed Care, Other (non HMO) | Admitting: Physician Assistant

## 2012-06-16 ENCOUNTER — Encounter (HOSPITAL_COMMUNITY)
Admission: RE | Admit: 2012-06-16 | Discharge: 2012-06-16 | Disposition: A | Discharge status: Home / Self Care | Payer: Managed Care, Other (non HMO) | Source: Ambulatory Visit | Attending: General Surgery | Admitting: General Surgery

## 2012-06-16 ENCOUNTER — Encounter (HOSPITAL_COMMUNITY): Payer: Self-pay

## 2012-06-16 LAB — BASIC METABOLIC PANEL
Calcium: 9.3 mg/dL (ref 8.4–10.5)
GFR calc Af Amer: >90 mL/min (ref >90)
GFR calc non Af Amer: 84 mL/min — ABNORMAL LOW (ref >90)
Glucose, Bld: 90 mg/dL (ref 70–99)
Potassium: 3.8 mEq/L (ref 3.5–5.1)
Sodium: 138 mEq/L (ref 135–145)

## 2012-06-16 LAB — CBC
Hemoglobin: 10.4 g/dL — ABNORMAL LOW (ref 12.0–15.0)
MCH: 32.1 pg (ref 26.0–34.0)
MCHC: 32.7 g/dL (ref 30.0–36.0)
Platelets: 383 10*3/uL (ref 150–400)
RDW: 16 % — ABNORMAL HIGH (ref 11.5–15.5)

## 2012-06-16 LAB — SURGICAL PCR SCREEN
MRSA, PCR: NEGATIVE
Staphylococcus aureus: NEGATIVE

## 2012-06-16 NOTE — Patient Instructions (Addendum)
20      Your procedure is scheduled on:  Wednesday 06/28/2012 at 0830 am  Report to [facility] at 0600 AM.  Call this number if you have problems the morning of surgery: [phone]   Remember:   Do not eat food or drink liquids after midnight!  Take these medicines the morning of surgery with A SIP OF WATER: NONE   Do not bring valuables to the hospital.  .  Leave suitcase in the car. After surgery it may be brought to your room.  For patients admitted to the hospital, checkout time is 11:00 AM the day of              Discharge.    Special Instructions: See [facility] Preparing  For Surgery Instruction Sheet. Do not wear jewelry, lotions powders, perfumes. Women do not shave legs or underarms for 12 hours before showers. Contacts, partial plates, or dentures may not be worn into surgery.                          Patients discharged the day of surgery will not be allowed to drive home. If going home the same day of surgery, must have someone stay with you first 24 hrs.at home and arrange for someone to drive you home from the Hospital.                Please read over the following fact sheets that you were given: MRSA              INFORMATION, Sleep apnea sheet, Incentive Spirometry sheet               Telford Nab.Handsome Anglin,RN,BSN [phone]

## 2012-06-19 ENCOUNTER — Other Ambulatory Visit: Payer: Self-pay | Admitting: Oncology

## 2012-06-20 ENCOUNTER — Other Ambulatory Visit: Payer: Self-pay | Admitting: *Deleted

## 2012-06-20 MED ORDER — AZITHROMYCIN 250 MG PO TABS: ORAL_TABLET | ORAL | Status: DC

## 2012-06-22 ENCOUNTER — Telehealth (INDEPENDENT_AMBULATORY_CARE_PROVIDER_SITE_OTHER): Payer: Self-pay | Admitting: General Surgery

## 2012-06-22 NOTE — Telephone Encounter (Signed)
Pt called to alert Dr. Donell Beers she has been put on a Z-Pac for a sinus infection.  Her surgery is ne

## 2012-06-22 NOTE — Telephone Encounter (Signed)
Pt wanted Dr. Donell Beers to be aware.

## 2012-06-26 ENCOUNTER — Encounter (HOSPITAL_COMMUNITY): Payer: Managed Care, Other (non HMO)

## 2012-06-26 ENCOUNTER — Ambulatory Visit (HOSPITAL_COMMUNITY): Payer: Managed Care, Other (non HMO)

## 2012-06-28 ENCOUNTER — Observation Stay (HOSPITAL_COMMUNITY)
Admission: RE | Admit: 2012-06-28 | Discharge: 2012-06-29 | Disposition: A | Discharge status: Home / Self Care | Payer: Managed Care, Other (non HMO) | Source: Ambulatory Visit | Attending: General Surgery | Admitting: General Surgery

## 2012-06-28 ENCOUNTER — Encounter (HOSPITAL_COMMUNITY): Payer: Self-pay | Admitting: *Deleted

## 2012-06-28 ENCOUNTER — Encounter (HOSPITAL_COMMUNITY): Payer: Self-pay | Admitting: Anesthesiology

## 2012-06-28 ENCOUNTER — Ambulatory Visit (HOSPITAL_COMMUNITY): Payer: Managed Care, Other (non HMO) | Admitting: Anesthesiology

## 2012-06-28 ENCOUNTER — Encounter (HOSPITAL_COMMUNITY): Payer: Self-pay | Admitting: General Surgery

## 2012-06-28 ENCOUNTER — Encounter (HOSPITAL_COMMUNITY)
Admission: RE | Disposition: A | Discharge status: Home / Self Care | Payer: Self-pay | Source: Ambulatory Visit | Attending: General Surgery

## 2012-06-28 DIAGNOSIS — O210 Mild hyperemesis gravidarum: Secondary | ICD-10-CM

## 2012-06-28 DIAGNOSIS — J301 Allergic rhinitis due to pollen: Secondary | ICD-10-CM

## 2012-06-28 DIAGNOSIS — D0590 Unspecified type of carcinoma in situ of unspecified breast: Secondary | ICD-10-CM

## 2012-06-28 DIAGNOSIS — N6019 Diffuse cystic mastopathy of unspecified breast: Secondary | ICD-10-CM

## 2012-06-28 DIAGNOSIS — G43909 Migraine, unspecified, not intractable, without status migrainosus: Secondary | ICD-10-CM

## 2012-06-28 DIAGNOSIS — D509 Iron deficiency anemia, unspecified: Secondary | ICD-10-CM

## 2012-06-28 DIAGNOSIS — E739 Lactose intolerance, unspecified: Secondary | ICD-10-CM

## 2012-06-28 DIAGNOSIS — Z803 Family history of malignant neoplasm of breast: Secondary | ICD-10-CM | POA: Insufficient documentation

## 2012-06-28 DIAGNOSIS — C50419 Malignant neoplasm of upper-outer quadrant of unspecified female breast: Principal | ICD-10-CM | POA: Insufficient documentation

## 2012-06-28 DIAGNOSIS — Z01812 Encounter for preprocedural laboratory examination: Secondary | ICD-10-CM | POA: Insufficient documentation

## 2012-06-28 HISTORY — PX: MASTECTOMY MODIFIED RADICAL: SHX5962

## 2012-06-28 HISTORY — PX: SIMPLE MASTECTOMY WITH AXILLARY SENTINEL NODE BIOPSY: SHX6098

## 2012-06-28 LAB — CREATININE, SERUM
Creatinine, Ser: 0.7 mg/dL (ref 0.50–1.10)
GFR calc Af Amer: >90 mL/min (ref >90)
GFR calc non Af Amer: >90 mL/min (ref >90)

## 2012-06-28 LAB — CBC
Platelets: 309 10*3/uL (ref 150–400)
RBC: 3.21 MIL/uL — ABNORMAL LOW (ref 3.87–5.11)
WBC: 24.3 10*3/uL — ABNORMAL HIGH (ref 4.0–10.5)

## 2012-06-28 SURGERY — MASTECTOMY, MODIFIED RADICAL
Anesthesia: General | Site: Breast | Laterality: Right | Wound class: Clean

## 2012-06-28 MED ORDER — HYDROMORPHONE HCL PF 1 MG/ML IJ SOLN
INTRAMUSCULAR | Status: AC
  Filled 2012-06-28: qty 1

## 2012-06-28 MED ORDER — OXYCODONE-ACETAMINOPHEN 5-325 MG PO TABS
1.0000 | ORAL_TABLET | ORAL | Status: DC | PRN
  Administered 2012-06-29 (×2): 1 via ORAL
  Filled 2012-06-28 (×2): qty 1

## 2012-06-28 MED ORDER — HYDROMORPHONE HCL PF 1 MG/ML IJ SOLN
0.2500 mg | INTRAMUSCULAR | Status: DC | PRN
  Administered 2012-06-28 (×4): 0.5 mg via INTRAVENOUS

## 2012-06-28 MED ORDER — LIDOCAINE HCL 1 % IJ SOLN
INTRAMUSCULAR | Status: AC
  Filled 2012-06-28: qty 40

## 2012-06-28 MED ORDER — BUPIVACAINE-EPINEPHRINE 0.25% -1:200000 IJ SOLN
INTRAMUSCULAR | Status: AC
  Filled 2012-06-28: qty 1

## 2012-06-28 MED ORDER — BUPIVACAINE HCL (PF) 0.25 % IJ SOLN
INTRAMUSCULAR | Status: AC
  Filled 2012-06-28: qty 60

## 2012-06-28 MED ORDER — LACTATED RINGERS IV SOLN
INTRAVENOUS | Status: DC | PRN
  Administered 2012-06-28 (×2): via INTRAVENOUS

## 2012-06-28 MED ORDER — MORPHINE SULFATE 2 MG/ML IJ SOLN
1.0000 mg | INTRAMUSCULAR | Status: DC | PRN
  Administered 2012-06-28 – 2012-06-29 (×2): 2 mg via INTRAVENOUS
  Filled 2012-06-28 (×2): qty 1

## 2012-06-28 MED ORDER — EPHEDRINE SULFATE 50 MG/ML IJ SOLN
INTRAMUSCULAR | Status: DC | PRN
  Administered 2012-06-28: 5 mg via INTRAVENOUS

## 2012-06-28 MED ORDER — MEPERIDINE HCL 50 MG/ML IJ SOLN: 6.2500 mg | INTRAMUSCULAR | Status: DC | PRN

## 2012-06-28 MED ORDER — DEXAMETHASONE SODIUM PHOSPHATE 10 MG/ML IJ SOLN
INTRAMUSCULAR | Status: DC | PRN
  Administered 2012-06-28: 10 mg via INTRAVENOUS

## 2012-06-28 MED ORDER — HYDROMORPHONE HCL PF 1 MG/ML IJ SOLN
INTRAMUSCULAR | Status: DC | PRN
  Administered 2012-06-28 (×4): 0.5 mg via INTRAVENOUS

## 2012-06-28 MED ORDER — NAPROXEN SODIUM 220 MG PO TABS: 220.0000 mg | ORAL_TABLET | Freq: Two times a day (BID) | ORAL | Status: DC

## 2012-06-28 MED ORDER — MIDAZOLAM HCL 5 MG/5ML IJ SOLN
INTRAMUSCULAR | Status: DC | PRN
  Administered 2012-06-28: 2 mg via INTRAVENOUS

## 2012-06-28 MED ORDER — CIPROFLOXACIN IN D5W 400 MG/200ML IV SOLN
400.0000 mg | Freq: Two times a day (BID) | INTRAVENOUS | Status: AC
  Administered 2012-06-28: 400 mg via INTRAVENOUS
  Filled 2012-06-28: qty 200

## 2012-06-28 MED ORDER — SUCCINYLCHOLINE CHLORIDE 20 MG/ML IJ SOLN
INTRAMUSCULAR | Status: DC | PRN
  Administered 2012-06-28: 100 mg via INTRAVENOUS

## 2012-06-28 MED ORDER — LACTATED RINGERS IV SOLN: INTRAVENOUS | Status: DC

## 2012-06-28 MED ORDER — ENOXAPARIN SODIUM 40 MG/0.4ML ~~LOC~~ SOLN
40.0000 mg | Freq: Every day | SUBCUTANEOUS | Status: DC
  Administered 2012-06-29: 40 mg via SUBCUTANEOUS
  Filled 2012-06-28 (×2): qty 0.4

## 2012-06-28 MED ORDER — CIPROFLOXACIN IN D5W 400 MG/200ML IV SOLN
400.0000 mg | INTRAVENOUS | Status: AC
  Administered 2012-06-28: 400 mg via INTRAVENOUS

## 2012-06-28 MED ORDER — PROMETHAZINE HCL 25 MG/ML IJ SOLN
12.5000 mg | Freq: Four times a day (QID) | INTRAMUSCULAR | Status: DC | PRN
  Administered 2012-06-28: 12.5 mg via INTRAVENOUS
  Filled 2012-06-28: qty 1

## 2012-06-28 MED ORDER — BUPIVACAINE-EPINEPHRINE 0.25% -1:200000 IJ SOLN
INTRAMUSCULAR | Status: DC | PRN
  Administered 2012-06-28: 40 mL

## 2012-06-28 MED ORDER — LIDOCAINE HCL (PF) 1 % IJ SOLN
INTRAMUSCULAR | Status: DC | PRN
  Administered 2012-06-28: 40 mL

## 2012-06-28 MED ORDER — NAPROXEN 250 MG PO TABS
250.0000 mg | ORAL_TABLET | Freq: Two times a day (BID) | ORAL | Status: DC
  Administered 2012-06-29: 250 mg via ORAL
  Filled 2012-06-28 (×4): qty 1

## 2012-06-28 MED ORDER — PROPOFOL 10 MG/ML IV BOLUS
INTRAVENOUS | Status: DC | PRN
  Administered 2012-06-28: 50 mg via INTRAVENOUS
  Administered 2012-06-28: 150 mg via INTRAVENOUS

## 2012-06-28 MED ORDER — AZITHROMYCIN 500 MG PO TABS: 500.0000 mg | ORAL_TABLET | Freq: Once | ORAL | Status: DC

## 2012-06-28 MED ORDER — CIPROFLOXACIN IN D5W 400 MG/200ML IV SOLN
INTRAVENOUS | Status: AC
  Filled 2012-06-28: qty 200

## 2012-06-28 MED ORDER — LIDOCAINE-EPINEPHRINE 1 %-1:100000 IJ SOLN
INTRAMUSCULAR | Status: AC
  Filled 2012-06-28: qty 2

## 2012-06-28 MED ORDER — PROMETHAZINE HCL 25 MG/ML IJ SOLN: 6.2500 mg | INTRAMUSCULAR | Status: DC | PRN

## 2012-06-28 MED ORDER — LIDOCAINE HCL (CARDIAC) 20 MG/ML IV SOLN
INTRAVENOUS | Status: DC | PRN
  Administered 2012-06-28: 50 mg via INTRAVENOUS

## 2012-06-28 MED ORDER — FENTANYL CITRATE 0.05 MG/ML IJ SOLN
INTRAMUSCULAR | Status: DC | PRN
  Administered 2012-06-28: 150 ug via INTRAVENOUS
  Administered 2012-06-28: 100 ug via INTRAVENOUS

## 2012-06-28 MED ORDER — KCL IN DEXTROSE-NACL 20-5-0.45 MEQ/L-%-% IV SOLN
INTRAVENOUS | Status: DC
  Administered 2012-06-28: 100 mL/h via INTRAVENOUS
  Administered 2012-06-29: 100 mL via INTRAVENOUS
  Filled 2012-06-28 (×3): qty 1000

## 2012-06-28 SURGICAL SUPPLY — 55 items
BANDAGE ELASTIC 6 VELCRO ST LF (GAUZE/BANDAGES/DRESSINGS) IMPLANT
BANDAGE GAUZE ELAST BULKY 4 IN (GAUZE/BANDAGES/DRESSINGS) ×3 IMPLANT
BINDER BREAST XLRG (GAUZE/BANDAGES/DRESSINGS) ×3 IMPLANT
BLADE SURG 15 STRL LF DISP TIS (BLADE) IMPLANT
BLADE SURG 15 STRL SS (BLADE)
BLADE SURG SZ10 CARB STEEL (BLADE) IMPLANT
BNDG COHESIVE 4X5 TAN STRL (GAUZE/BANDAGES/DRESSINGS) ×3 IMPLANT
CANISTER SUCTION 2500CC (MISCELLANEOUS) ×3 IMPLANT
CLIP TI MEDIUM 6 (CLIP) ×24 IMPLANT
CLIP TI WIDE RED SMALL 6 (CLIP) ×6 IMPLANT
CLOTH BEACON ORANGE TIMEOUT ST (SAFETY) ×3 IMPLANT
CONT SPEC 4OZ CLIKSEAL STRL BL (MISCELLANEOUS) ×3 IMPLANT
DECANTER SPIKE VIAL GLASS SM (MISCELLANEOUS) ×12 IMPLANT
DERMABOND ADVANCED (GAUZE/BANDAGES/DRESSINGS) ×4
DERMABOND ADVANCED .7 DNX12 (GAUZE/BANDAGES/DRESSINGS) ×8 IMPLANT
DRAIN CHANNEL 19F RND (DRAIN) ×9 IMPLANT
DRAIN CHANNEL RND F F (WOUND CARE) IMPLANT
ELECT CAUTERY BLADE 6.4 (BLADE) ×3 IMPLANT
ELECT REM PT RETURN 9FT ADLT (ELECTROSURGICAL) ×3
ELECTRODE REM PT RTRN 9FT ADLT (ELECTROSURGICAL) ×2 IMPLANT
EVACUATOR SILICONE 100CC (DRAIN) ×9 IMPLANT
GAUZE SPONGE 4X4 16PLY XRAY LF (GAUZE/BANDAGES/DRESSINGS) IMPLANT
GLOVE BIO SURGEON STRL SZ 6 (GLOVE) ×6 IMPLANT
GLOVE BIOGEL PI IND STRL 6.5 (GLOVE) ×2 IMPLANT
GLOVE BIOGEL PI IND STRL 7.0 (GLOVE) ×2 IMPLANT
GLOVE BIOGEL PI INDICATOR 6.5 (GLOVE) ×1
GLOVE BIOGEL PI INDICATOR 7.0 (GLOVE) ×1
GLOVE INDICATOR 6.5 STRL GRN (GLOVE) ×6 IMPLANT
GOWN PREVENTION PLUS XLARGE (GOWN DISPOSABLE) ×3 IMPLANT
GOWN PREVENTION PLUS XXLARGE (GOWN DISPOSABLE) ×3 IMPLANT
KIT BASIN OR (CUSTOM PROCEDURE TRAY) ×3 IMPLANT
NEEDLE HYPO 22GX1.5 SAFETY (NEEDLE) ×3 IMPLANT
NEEDLE SPNL 22GX3.5 QUINCKE BK (NEEDLE) ×6 IMPLANT
NS IRRIG 1000ML POUR BTL (IV SOLUTION) ×12 IMPLANT
PACK GENERAL/GYN (CUSTOM PROCEDURE TRAY) IMPLANT
PACK UNIVERSAL I (CUSTOM PROCEDURE TRAY) ×3 IMPLANT
PEN SKIN MARKING BROAD (MISCELLANEOUS) ×3 IMPLANT
SPONGE DRAIN TRACH 4X4 STRL 2S (GAUZE/BANDAGES/DRESSINGS) ×6 IMPLANT
SPONGE GAUZE 4X4 12PLY (GAUZE/BANDAGES/DRESSINGS) IMPLANT
SPONGE LAP 18X18 X RAY DECT (DISPOSABLE) ×15 IMPLANT
STAPLER VISISTAT 35W (STAPLE) ×3 IMPLANT
STOCKINETTE 8 INCH (MISCELLANEOUS) ×3 IMPLANT
STRIP CLOSURE SKIN 1/2X4 (GAUZE/BANDAGES/DRESSINGS) ×12 IMPLANT
SUT ETHILON 2 0 PS N (SUTURE) ×9 IMPLANT
SUT MNCRL AB 4-0 PS2 18 (SUTURE) ×6 IMPLANT
SUT SILK 2 0 (SUTURE) ×1
SUT SILK 2 0 SH (SUTURE) ×3 IMPLANT
SUT SILK 2-0 18XBRD TIE 12 (SUTURE) ×2 IMPLANT
SUT SILK 3 0 (SUTURE) ×1
SUT SILK 3-0 18XBRD TIE 12 (SUTURE) ×2 IMPLANT
SUT VIC AB 3-0 SH 27 (SUTURE)
SUT VIC AB 3-0 SH 27XBRD (SUTURE) IMPLANT
SUT VIC AB 3-0 SH 8-18 (SUTURE) ×21 IMPLANT
SYR CONTROL 10ML LL (SYRINGE) ×3 IMPLANT
TOWEL OR 17X26 10 PK STRL BLUE (TOWEL DISPOSABLE) ×3 IMPLANT

## 2012-06-28 NOTE — H&P (Signed)
  HISTORY:  Pt has been doing great with chemotherapy. Her counts have been good, and she has not been having difficulty with nausea or vomiting. She is planning on having reconstruction in [place].   PERTINENT REVIEW OF SYSTEMS:  Otherwise negative.   EXAM:  Head: Normocephalic and atraumatic.  Eyes: Conjunctivae are normal. Pupils are equal, round, and reactive to light. No scleral icterus.  Neck: Normal range of motion. Neck supple. No tracheal deviation present. No thyromegaly present.  Resp: No respiratory distress, normal effort.  Breast: R breast without palpable mass now or skin changes. Axillary node no longer palpable. Left breast without masses.  Abd: Abdomen is soft, non distended and non tender. No masses are palpable. There is no rebound and no guarding.  Neurological: Alert and oriented to person, place, and time. Coordination normal.  Skin: Skin is warm and dry. No rash noted. No diaphoretic. No erythema. No pallor.  Psychiatric: Normal mood and affect. Normal behavior. Judgment and thought content normal.   ASSESSMENT AND PLAN:  Cancer of upper-outer quadrant of female breast, right breast. T4dN1M0  Pt has had dramatic response to chemo with skin improvement to baseline, mass has disappeared.  Plan right MRM, Left simple mastectomy.  Pt had clinically positive lymph nodes preoperatively. Will do ALND on the cancer. Side.  Pt to get delayed [facility] at [facility]  Spend 25 minutes discussing plan, risks and benefits of surgery.  Risks include bleeding, infection, pain, possible need for additional procedures.  Discussed recovery time and lifting restrictions.  Discussed drains and no shower with drains in.   Maudry Diego, MD  Surgical Oncology, General & Endocrine Surgery  [facility]  [name], CNM  [name], CNM

## 2012-06-28 NOTE — Anesthesia Postprocedure Evaluation (Addendum)
  Anesthesia Post-op Note  Patient: Jody Dunn  Procedure(s) Performed: Procedure(s) (LRB): MASTECTOMY MODIFIED RADICAL (Right) SIMPLE MASTECTOMY (Left)  Patient Location: PACU  Anesthesia Type: General  Level of Consciousness: awake and alert   Airway and Oxygen Therapy: Patient Spontanous Breathing  Post-op Pain: mild  Post-op Assessment: Post-op Vital signs reviewed, Patient's Cardiovascular Status Stable, Respiratory Function Stable, Patent Airway and No signs of Nausea or vomiting  Post-op Vital Signs: stable  Complications: No apparent anesthesia complications

## 2012-06-28 NOTE — Op Note (Signed)
Right modified radical Mastectomy and left simple mastectomy  Indications: This patient presents with history of right breast cancer with clinically positive axillary lymph node exam.  Pre-operative Diagnosis: right breast cancer, cT4N1, strong family history of breast cancer  Post-operative Diagnosis: right breast cancer, same  Surgeon: Almond Lint   Assistant:  Jamey Ripa, CHRIS  Anesthesia: General endotracheal anesthesia and Local anesthesia 0.25.% bupivacaine, with epinephrine and 1% lidocaine.    ASA Class: 2  Procedure Details  The patient was seen in the Holding Room. The risks, benefits, complications, treatment options, and expected outcomes were discussed with the patient. The possibilities of reaction to medication, pulmonary aspiration, bleeding, infection, the need for additional procedures, failure to diagnose a condition, and creating a complication requiring transfusion or operation were discussed with the patient. The patient concurred with the proposed plan, giving informed consent.  The site of surgery properly noted/marked. The patient was taken to Operating Room # 1, identified,  and the procedure verified. A Time Out was held and the above information confirmed.     After induction of anesthesia, the right arm, breast, and chest were prepped and draped in standard fashion.   The borders of the breast were identified and marked.  The incisions of the breast was drawn out to make sure incision lines were equidistant in length.    The superior incision was made with the #10 blade.  The marcaine/saline mixture was infiltrated into the superior flap.  Mastectomy hooks were used to provide elevation of the skin edges, and the curved Mayo scissors were used to create the mastectomy flaps.  The dissection was taken to the fascia of the pectoralis major.  The penetrating vessels were clipped.  The superior flap was taken medially to the lateral sternal border, superiorly to the  inferior border of the clavicle.  The inferior flap was similarly created, inferiorly to the inframammary fold and laterally to the border of the latissimus.  The breast was left hanging from the axilla to facilitate axillary dissection.      The axillary fat pad was then taken off the long thoracic nerve medially and the thoracodorsal complex laterally.  The fatty tissue was swept off the inferior border of the axillary vein.  Clips were used to close small vessels and lymphatic channels.  No paralytics were on board and both nerves were functional after the contents were removed.      The wound was irrigated.   Two 19 Blake drains were placed laterally.   Hemostasis was achieved with cautery.  The wound was irrigated and closed with a 3-0 Vicryl deep dermal interrupted sutures and 4-0 Vicryl subcuticular closure in layers.    The left breast borders were identified and marked.  The incisions of the breast was drawn out to make sure incision lines were equidistant in length.    The superior incision was made with the #10 blade.  The marcaine/saline mixture was infiltrated into the superior flap.  Mastectomy hooks were used to provide elevation of the skin edges, and the curved Mayo scissors were used to create the mastectomy flaps.  The dissection was taken to the fascia of the pectoralis major.  The penetrating vessels were clipped.  The superior flap was taken medially to the lateral sternal border, superiorly to the inferior border of the clavicle.  The inferior flap was similarly created, inferiorly to the inframammary fold and laterally to the border of the latissimus.      The wound was irrigated.  One 19 Blake drains was placed laterally.   Hemostasis was achieved with cautery.  The wound was irrigated and closed with a 3-0 Vicryl deep dermal interrupted sutures and 4-0 Vicryl subcuticular closure in layers.  Sterile dressings were applied. At the end of the operation, all sponge, instrument, and  needle counts were correct.  Findings: grossly clear surgical margins  Estimated Blood Loss:  less than 50 mL         Drains: 3 19 Blakes                Specimens: R breast and right axillary contents, right level 2 nodes, left breast         Complications:  None; patient tolerated the procedure well.         Disposition: PACU - hemodynamically stable.         Condition: stable

## 2012-06-28 NOTE — Anesthesia Preprocedure Evaluation (Addendum)

## 2012-06-28 NOTE — Transfer of Care (Signed)
Immediate Anesthesia Transfer of Care Note  Patient: Jody Dunn  Procedure(s) Performed: Procedure(s) (LRB) with comments: MASTECTOMY MODIFIED RADICAL (Right) - Right Modified Radical Mastectomy and Left Simple Mastectomy SIMPLE MASTECTOMY (Left)  Patient Location: PACU  Anesthesia Type:General  Level of Consciousness: awake, alert , oriented and patient cooperative  Airway & Oxygen Therapy: Patient Spontanous Breathing and Patient connected to face mask oxygen  Post-op Assessment: Report given to PACU RN, Post -op Vital signs reviewed and stable and Patient moving all extremities X 4  Post vital signs: Reviewed and stable  Complications: No apparent anesthesia complications

## 2012-06-28 NOTE — Preoperative (Signed)
Beta Blockers   Reason not to administer Beta Blockers:Not Applicable 

## 2012-06-29 ENCOUNTER — Encounter (HOSPITAL_COMMUNITY): Payer: Self-pay | Admitting: General Surgery

## 2012-06-29 LAB — CBC
HCT: 28.2 % — ABNORMAL LOW (ref 36.0–46.0)
MCHC: 33 g/dL (ref 30.0–36.0)
Platelets: 283 10*3/uL (ref 150–400)
RDW: 14.4 % (ref 11.5–15.5)
WBC: 11.1 10*3/uL — ABNORMAL HIGH (ref 4.0–10.5)

## 2012-06-29 LAB — BASIC METABOLIC PANEL
Chloride: 102 mEq/L (ref 96–112)
GFR calc Af Amer: >90 mL/min (ref >90)
GFR calc non Af Amer: >90 mL/min (ref >90)
Potassium: 3.9 mEq/L (ref 3.5–5.1)
Sodium: 137 mEq/L (ref 135–145)

## 2012-06-29 MED ORDER — OXYCODONE-ACETAMINOPHEN 5-325 MG PO TABS: 1.0000 | ORAL_TABLET | ORAL | Status: DC | PRN

## 2012-06-29 NOTE — Progress Notes (Signed)
Patient discharged via wheelchair. States understanding of drain care and discharge instructions. RX for percocet given.

## 2012-06-29 NOTE — Care Management Note (Signed)
    Page 1 of 1   06/29/2012     1:52:51 PM   CARE MANAGEMENT NOTE 06/29/2012  Patient:  Jody Dunn, Jody Dunn   Account Number:  [id number]  Date Initiated:  06/29/2012  Documentation initiated by:  Lorenda Ishihara  Subjective/Objective Assessment:   36 yo female admitted s/p bilateral mastectomy. PTA lived at home with spouse.     Action/Plan:   home when stable   Anticipated DC Date:  06/29/2012   Anticipated DC Plan:  HOME/SELF CARE      DC Planning Services  CM consult      Choice offered to / List presented to:             Status of service:  Completed, signed off Medicare Important Message given?  NA - LOS <3 / Initial given by admissions (If response is "NO", the following Medicare IM given date fields will be blank) Date Medicare IM given:   Date Additional Medicare IM given:    Discharge Disposition:  HOME/SELF CARE  Per UR Regulation:  Reviewed for med. necessity/level of care/duration of stay  If discussed at Long Length of Stay Meetings, dates discussed:    Comments:

## 2012-06-29 NOTE — Discharge Summary (Signed)
Physician Discharge Summary  Patient ID: Jody Dunn MRN: [id number] DOB/AGE: 1976-02-28 36 y.o.  Admit date: 06/28/2012 Discharge date: 06/29/2012  Admission Diagnoses: Right breast cancer, T4dN1M0   Discharge Diagnoses:  Right breast cancer, as above  Discharged Condition: good  Hospital Course:  Pt admitted to the floor following bilateral mastectomies with right axillary lymphadenectomy.  She was able to ambulate independently.  She was able to void spontaneously.  She has been able to eat and take oral narcotics.  She is discharged to home with drain teaching.  She has follow up already scheduled.    Consults: None  Significant Diagnostic Studies: labs: HCT 28.2  Treatments: surgery: see above  Discharge Exam: Blood pressure 144/79, pulse 82, temperature 97.9 F (36.6 C), temperature source Oral, resp. rate 18, height 5\' 8"  (1.727 m), weight 164 lb 1.6 oz (74.435 kg), SpO2 100.00%. General appearance: alert, cooperative and no distress Chest wall:  No hematoma.  Skin flaps viable.   Drains serosanguinous.   Disposition: 01-Home or Self Care  Discharge Orders    Future Appointments: Provider: Department: Dept Phone: Center:   07/14/2012 9:30 AM Almond Lint, MD [facility], [place] [phone] None   07/17/2012 10:00 AM [facility] [phone] None   07/17/2012 11:30 AM [facility] [phone] None   07/18/2012 11:30 AM Windell Hum[facility] MEDICAL ONCOLOGY [phone] None   07/18/2012 12:00 PM Lowella Dell, MD [facility] MEDICAL ONCOLOGY [phone] None   07/18/2012 1:00 PM [facility] [phone] None   07/26/2012 9:00 AM Windell [facility] MEDICAL ONCOLOGY [phone] None   07/26/2012 9:30 AM Lowella Dell, MD [facility] MEDICAL ONCOLOGY [phone] None   08/08/2012 8:45 AM Dava Najjar [facility] MEDICAL ONCOLOGY [phone] None   08/08/2012 9:15 AM Amy Allegra Grana, PA [facility] MEDICAL ONCOLOGY [phone] None   08/08/2012 10:15 AM [facility] [phone] None       Medication List     As of 06/29/2012  7:38 AM    ASK your doctor about these medications         azithromycin 250 MG tablet   Commonly known as: ZITHROMAX   Take as directed      naproxen sodium 220 MG tablet   Commonly known as: ANAPROX   Take 220 mg by mouth 2 (two) times daily with a meal.      PROBIOTIC & ACIDOPHILUS EX ST Caps   Take 1 capsule by mouth daily.           Follow-up Information    Follow up with [facility], [place]. Schedule an appointment as soon as possible for a visit in 2 weeks.   Contact information:   [address] [place] [address] [phone]          Signed: Almond Lint 06/29/2012, 7:38 AM

## 2012-06-30 ENCOUNTER — Telehealth (INDEPENDENT_AMBULATORY_CARE_PROVIDER_SITE_OTHER): Payer: Self-pay | Admitting: General Surgery

## 2012-06-30 NOTE — Telephone Encounter (Signed)
Addendum to previous note regarding medication call in. Recalled to [facility], [place]. Hydrocodone 5/325mg  # 15 with 1 refill . Percocet causing itching/rx per T.O. Dr. Byerly/gy/ pt aware

## 2012-06-30 NOTE — Telephone Encounter (Signed)
Pt called to report that  She experienced itching after taking her Percocet. I advised her to stop the medication and take Benadryl. I reviewed this with Dr. Donell Beers and she ordered Hydrocodone 5/325 per protocol. #15 with 1 refill, called to [facility]/ [phone]/ pt aware. gy

## 2012-07-04 ENCOUNTER — Telehealth (INDEPENDENT_AMBULATORY_CARE_PROVIDER_SITE_OTHER): Payer: Self-pay | Admitting: General Surgery

## 2012-07-04 NOTE — Telephone Encounter (Signed)
Discussed path with patient.  Lymph nodes negative.    However, there were two masses and they only described one.  I will ask path to review.

## 2012-07-05 ENCOUNTER — Telehealth (INDEPENDENT_AMBULATORY_CARE_PROVIDER_SITE_OTHER): Payer: Self-pay | Admitting: General Surgery

## 2012-07-05 NOTE — Telephone Encounter (Signed)
Pt called to report the update on her drains following bilateral mastectomy.  She has a total of three drains.  One is down to 10-20 cc/day, another is 15-25 cc/day.  Dr. Donell Beers told her to come in Friday to get one or two out, pending the drainage amount.  Made her a Nurse Only appt for Friday mid-morning.

## 2012-07-07 ENCOUNTER — Telehealth (INDEPENDENT_AMBULATORY_CARE_PROVIDER_SITE_OTHER): Payer: Self-pay

## 2012-07-07 ENCOUNTER — Encounter (INDEPENDENT_AMBULATORY_CARE_PROVIDER_SITE_OTHER): Payer: Self-pay | Admitting: General Surgery

## 2012-07-07 ENCOUNTER — Ambulatory Visit (INDEPENDENT_AMBULATORY_CARE_PROVIDER_SITE_OTHER): Payer: Managed Care, Other (non HMO) | Admitting: General Surgery

## 2012-07-07 VITALS — BP 116/74 | HR 60 | Temp 97.8°F | Resp 14 | Ht 68.0 in | Wt 169.8 lb

## 2012-07-07 DIAGNOSIS — Z4803 Encounter for change or removal of drains: Secondary | ICD-10-CM

## 2012-07-07 NOTE — Telephone Encounter (Signed)
Discussed path results with the patient.  She is coming in today for drain removal, and Dr. Donell Beers can speak with her then.

## 2012-07-11 ENCOUNTER — Telehealth (INDEPENDENT_AMBULATORY_CARE_PROVIDER_SITE_OTHER): Payer: Self-pay | Admitting: General Surgery

## 2012-07-11 NOTE — Telephone Encounter (Signed)
Pt called to ask about a small "bulge where her armpit meets her body" (not actually in the armpit,)  She states she it is not painful or red; no signs of infection.  Pt has appt later this week.  Reassured pt and discussed waiting to have it assesses at her scheduled appt with her surgeon.  She will call back if sx of infection develop, including increased pain, swelling and redness, fever or chills or red streaks from site.

## 2012-07-12 ENCOUNTER — Ambulatory Visit: Payer: Self-pay | Admitting: Radiation Oncology

## 2012-07-13 ENCOUNTER — Telehealth (INDEPENDENT_AMBULATORY_CARE_PROVIDER_SITE_OTHER): Payer: Self-pay | Admitting: General Surgery

## 2012-07-13 NOTE — Telephone Encounter (Signed)
Pt called with clotting in tubing to last remaining JP drain.  Explained how to "milk" the tubing.  She did this and reported the tubing stays flattened.  There is some leaking around the tubing now, but nothing currently flowing through it.  She has an appt tomorrow with Dr. Donell Beers, so instructed pt to apply gauze around the tubing to absorb anything coming through her skin.  She understands and will comply.

## 2012-07-14 ENCOUNTER — Encounter (INDEPENDENT_AMBULATORY_CARE_PROVIDER_SITE_OTHER): Payer: Self-pay | Admitting: General Surgery

## 2012-07-14 ENCOUNTER — Ambulatory Visit (INDEPENDENT_AMBULATORY_CARE_PROVIDER_SITE_OTHER): Payer: Managed Care, Other (non HMO) | Admitting: General Surgery

## 2012-07-14 VITALS — BP 130/74 | HR 87 | Temp 96.6°F | Resp 18 | Ht 68.0 in | Wt 171.6 lb

## 2012-07-14 DIAGNOSIS — C50419 Malignant neoplasm of upper-outer quadrant of unspecified female breast: Secondary | ICD-10-CM

## 2012-07-14 NOTE — Progress Notes (Signed)
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 Her last drain has decreased in output. She does have a thickened area in her axilla. Her pain is controlled.    EXAM: General:  Alert and oriented Incision:  Healing well.  Drain output serous    ASSESSMENT AND PLAN:   Cancer of upper-outer quadrant of female breast, right breast.  T4dN1M0 Follow up in 3 months.  Pt educated about seroma.  She will call if that develops.        Maudry Diego, MD Surgical Oncology, General & Endocrine Surgery [facility]  [name], CNM [name], CNM

## 2012-07-14 NOTE — Patient Instructions (Signed)
Call if seroma (fluid accumulation) occurs.  Follow up in 3 months.

## 2012-07-14 NOTE — Assessment & Plan Note (Signed)
Follow up in 3 months.  Pt educated about seroma.  She will call if that develops.

## 2012-07-17 ENCOUNTER — Encounter (INDEPENDENT_AMBULATORY_CARE_PROVIDER_SITE_OTHER): Payer: Self-pay | Admitting: General Surgery

## 2012-07-17 ENCOUNTER — Ambulatory Visit (INDEPENDENT_AMBULATORY_CARE_PROVIDER_SITE_OTHER): Payer: Managed Care, Other (non HMO) | Admitting: General Surgery

## 2012-07-17 ENCOUNTER — Ambulatory Visit (HOSPITAL_BASED_OUTPATIENT_CLINIC_OR_DEPARTMENT_OTHER)
Admission: RE | Admit: 2012-07-17 | Discharge: 2012-07-17 | Disposition: A | Discharge status: Home / Self Care | Payer: Managed Care, Other (non HMO) | Source: Ambulatory Visit | Attending: Internal Medicine | Admitting: Internal Medicine

## 2012-07-17 ENCOUNTER — Ambulatory Visit (HOSPITAL_COMMUNITY)
Admission: RE | Admit: 2012-07-17 | Discharge: 2012-07-17 | Disposition: A | Discharge status: Home / Self Care | Payer: Managed Care, Other (non HMO) | Source: Ambulatory Visit | Attending: Internal Medicine | Admitting: Internal Medicine

## 2012-07-17 VITALS — BP 118/80 | HR 78 | Wt 172.0 lb

## 2012-07-17 VITALS — BP 128/82 | HR 62 | Temp 98.0°F | Resp 18 | Ht 68.0 in | Wt 171.0 lb

## 2012-07-17 DIAGNOSIS — C50419 Malignant neoplasm of upper-outer quadrant of unspecified female breast: Secondary | ICD-10-CM

## 2012-07-17 DIAGNOSIS — C50911 Malignant neoplasm of unspecified site of right female breast: Secondary | ICD-10-CM

## 2012-07-17 DIAGNOSIS — Z09 Encounter for follow-up examination after completed treatment for conditions other than malignant neoplasm: Secondary | ICD-10-CM

## 2012-07-17 DIAGNOSIS — C50919 Malignant neoplasm of unspecified site of unspecified female breast: Secondary | ICD-10-CM

## 2012-07-17 NOTE — Assessment & Plan Note (Signed)
Patient seen and examined with Tonye Becket, NP. We discussed all aspects of the encounter. I agree with the assessment and plan as stated above.   I reviewed echos personally. EF and Doppler parameters stable. No HF on exam. Continue Herceptin.

## 2012-07-17 NOTE — Progress Notes (Signed)
Seroma accumulated after drain removal Friday.    80 mL from right and 25 from left.  Aspiration done sterilely.  Follow up 2 weeks or earlier if seroma accumulates.

## 2012-07-17 NOTE — Progress Notes (Signed)
Patient ID: Jody Dunn, female   DOB: [birth year], 36 y.o.   MRN: [id number] Advanced Heart Failure Team Consult Note  Referring Physician: Dr. Darnelle Catalan  Reason for Consultation: Enrollment into cardio-oncology clinic  HPI:    Jody Dunn is a 36 y/o woman with recently diagnosed triple + R breast cancer (stage IIIb with skin and lymph node involvement) referred by Dr. Darnelle Catalan for enrollment into the cardio-oncology clinic.  In June 2013, the patient felt a new lump in the right breast, and brought this to the attention of Jody Dunn at [facility]. Found to have breast masses. Both these masses were biopsied 02/09/2012, and the final pathology ([id number]) showed both IIb invasive ductal carcinoma, with a prognostic panel (from the mass at 11:00) as follows: estrogen receptor positive at 90%, progesterone receptor positive at 90%, Mib-1 of 30%, and HER-2 amplification with a ratio of 4.29 by CISH. MRI of the breast was performed 02/15/2012 and showed the mass in the 10:00 position to measure 3.1 cm, the one at the 11:00 position to measure 3.1 cm. There was diffuse skin thickening in the right breast and 2 enhancing abnormal-appearing lower right axillary lymph nodes measuring 2.0 and 1.8 cm respectively. There was no evidence for internal mammary adenopathy, left axillary adenopathy, or significant findings in the left breast. Both lymph nodes and skin bx + for CA so staged at IIIb.   Echo form 02/23/12 reviewed in clinic EF 60%  Started chemo July 10 with dose dense cycles of doxorubicin/cyclophosphamide being given in the neoadjuvant setting. Has had 2 cycles already. Then planning bilateral mastectomies and XRT. Will start Herceptin after surgery.   Very active at baseline. No known heart problems. Has a h/o palpitations during pregnancy and saw Dr. Eldridge Dace and echo was normal. No edema, orthopnea, PND. No HTN. 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   Review of Systems: [y] = yes, [ ]  = no    General: Weight gain [ ] ; Weight loss [ ] ; Anorexia [ ] ; Fatigue [ ] ; Fever [ ] ; Chills [ ] ; Weakness [ ]   Cardiac: Chest pain/pressure [ ] ; Resting SOB [ ] ; Exertional SOB [ ] ; Orthopnea [ ] ; Pedal Edema [ ] ; Palpitations [ ] ; Syncope [ ] ; Presyncope [ ] ; Paroxysmal nocturnal dyspnea[ ]   Pulmonary: Cough [ ] ; Wheezing[ ] ; Hemoptysis[ ] ; Sputum [ ] ; Snoring [ ]   GI: Vomiting[ ] ; Dysphagia[ ] ; Melena[ ] ; Hematochezia [ ] ; Heartburn[ ] ; Abdominal pain [ ] ; Constipation [ ] ; Diarrhea [ ] ; BRBPR [ ]   GU: Hematuria[ ] ; Dysuria [ ] ; Nocturia[ ]   Vascular: Pain in legs with walking [ ] ; Pain in feet with lying flat [ ] ; Non-healing sores [ ] ; Stroke [ ] ; TIA [ ] ; Slurred speech [ ] ;  Neuro: Headaches[ ] ; Vertigo[ ] ; Seizures[ ] ; Paresthesias[ ] ;Blurred vision [ ] ; Diplopia [ ] ; Vision changes [ ]   Ortho/Skin: Arthritis [ ] ; Joint pain [ ] ; Muscle pain [ ] ; Joint swelling [ ] ; Back Pain [ ] ; Rash [ ]   Psych: Depression[ ] ; Anxiety[ ]   Heme: Bleeding problems [ ] ; Clotting disorders [ ] ; Anemia [ ]   Endocrine: Diabetes [ ] ; Thyroid dysfunction[ ]   Home Medications Prior to Admission medications   Medication Sig Start Date End Date Taking? Authorizing Provider  dexamethasone (DECADRON) 4 MG tablet Take 8 mg by mouth 2 (two) times daily with a meal.   Yes Historical Provider, MD  lidocaine-prilocaine (EMLA) cream  02/29/12  Yes Historical Provider, MD  LORazepam (ATIVAN) 0.5 MG  tablet  02/23/12  Yes Historical Provider, MD  metoCLOPramide (REGLAN) 10 MG tablet Take 1 tablet (10 mg total) by mouth 4 (four) times daily as needed. 03/08/12  Yes Amy Allegra Grana, PA  omeprazole (PRILOSEC) 20 MG capsule Take 1 capsule (20 mg total) by mouth daily. 03/08/12 03/08/13 Yes Amy Allegra Grana, PA  Probiotic Product (PROBIOTIC & ACIDOPHILUS EX ST) CAPS Take 1 capsule by mouth daily.     Yes Historical Provider, MD  promethazine (PHENERGAN) 25 MG tablet Take 25 mg by mouth every 6 (six) hours as needed. 03/07/12  Yes Lowella Dell, MD  SUMAtriptan (IMITREX) 50 MG tablet Take 1 tablet (50 mg total) by mouth as directed. 03/02/12 03/02/13 Yes Lowella Dell, MD    Past Medical History: Past Medical History  Diagnosis Date  . Iron (Fe) deficiency anemia   . Asthma     exercise induced  . Breast cancer   . Back pain   . Migraines   . Lactose intolerance   . Hyperlipidemia     Past Surgical History: Past Surgical History  Procedure Date  . Appendectomy   . Shoulder surgery   . Knee surgery     X2  . Portacath placement 02/21/2012    Procedure: INSERTION PORT-A-CATH;  Surgeon: Almond Lint, MD;  Location: [facility];  Service: General;  Laterality: N/A;  . Skin biopsy 02/21/2012    Procedure: BIOPSY SKIN;  Surgeon: Almond Lint, MD;  Location: [facility];  Service: General;  Laterality: Right;  . Breast biopsy 02/09/2012  . Mastectomy modified radical 06/28/2012    Procedure: MASTECTOMY MODIFIED RADICAL;  Surgeon: Almond Lint, MD;  Location: [facility];  Service: General;  Laterality: Right;  Right Modified Radical Mastectomy and Left Simple Mastectomy  . Simple mastectomy with axillary sentinel node biopsy 06/28/2012    Procedure: SIMPLE MASTECTOMY;  Surgeon: Almond Lint, MD;  Location: [facility];  Service: General;  Laterality: Left;    Family History: Family History  Problem Relation Age of Onset  . Cancer Paternal Aunt 63    Breast cancer (half sibling, related through father)  . Spina bifida Paternal Uncle 0    died around 23-45 months of age  . Cancer Maternal Grandmother 98    colon cancer  . Dementia Maternal Grandfather   . Cancer Paternal Grandmother     breast cancer between 27-42  . Cancer Paternal Grandfather     brain cancer  . Cancer Other     Paternal grandmother's siblings had breast cancer and pancreatic cancer  . Cancer Other 23    maternal great grandmother with breast cancer    Social History: History   Social History  . Marital Status: Married    Spouse Name: N/A    Number of  Children: N/A  . Years of Education: N/A   Social History Main Topics  . Smoking status: Never Smoker   . Smokeless tobacco: Never Used  . Alcohol Use: 2.5 - 3.5 oz/week    5-7 drink(s) per week     Comment: OCCASIONAL  . Drug Use: No  . Sexually Active: Yes    Birth Control/ Protection: IUD   Other Topics Concern  . Not on file   Social History Narrative  . No narrative on file    Allergies:  Allergies  Allergen Reactions  . Zofran (Ondansetron Hcl) Other (See Comments)    Migraine headaches and vomiting  . Antiseptic Products, Misc.     duraprep-rash  .  Duraprep (Antiseptic Products, Misc.) Rash  . Penicillins Rash    vomiting    Objective:    Vital Signs:        Weight change: There were no vitals filed for this visit.  Intake/Output:  No intake or output data in the 24 hours ending 07/17/12 1053   Physical Exam: General:  Well appearing. No resp difficulty HEENT: normal Neck: supple. JVP . Carotids 2+ bilat; no bruits. No lymphadenopathy or thryomegaly appreciated. Cor: PMI nondisplaced. Regular rate & rhythm. No rubs, gallops or murmurs. Lungs: clear Abdomen: soft, nontender, nondistended. No hepatosplenomegaly. No bruits or masses. Good bowel sounds. Extremities: no cyanosis, clubbing, rash, edema Neuro: alert & orientedx3, cranial nerves grossly intact. moves all 4 extremities w/o difficulty. Affect pleasant   Labs: Basic Metabolic Panel: No results found for this basename: NA:5,K:5,CL:5,CO2:5,GLUCOSE:5,BUN:5,CREATININE:5,CALCIUM:3,MG:5,PHOS:5 in the last 168 hours  Liver Function Tests: No results found for this basename: AST:5,ALT:5,ALKPHOS:5,BILITOT:5,PROT:5,ALBUMIN:5 in the last 168 hours No results found for this basename: LIPASE:5,AMYLASE:5 in the last 168 hours No results found for this basename: AMMONIA:3 in the last 168 hours  CBC: No results found for this basename: WBC:5,NEUTROABS:5,HGB:5,HCT:5,MCV:5,PLT:5 in the last 168  hours

## 2012-07-17 NOTE — Progress Notes (Signed)
Echocardiogram 2D Echocardiogram limited has been performed.  Jade Burright 07/17/2012, 10:41 AM

## 2012-07-17 NOTE — Patient Instructions (Signed)
Follow up with me in two weeks.    If seroma reacumulates, call clinic and someone can see you earlier.

## 2012-07-17 NOTE — Patient Instructions (Addendum)
Your physician has requested that you have an echocardiogram. Echocardiography is a painless test that uses sound waves to create images of your heart. It provides your doctor with information about the size and shape of your heart and how well your heart's chambers and valves are working. This procedure takes approximately one hour. There are no restrictions for this procedure. IN 3 MONTHS  We will contact you in 3 months to schedule your next appointment.  

## 2012-07-18 ENCOUNTER — Other Ambulatory Visit (HOSPITAL_BASED_OUTPATIENT_CLINIC_OR_DEPARTMENT_OTHER): Payer: Managed Care, Other (non HMO) | Admitting: Lab

## 2012-07-18 ENCOUNTER — Other Ambulatory Visit: Payer: Self-pay | Admitting: *Deleted

## 2012-07-18 ENCOUNTER — Ambulatory Visit (HOSPITAL_BASED_OUTPATIENT_CLINIC_OR_DEPARTMENT_OTHER): Payer: Managed Care, Other (non HMO) | Admitting: Oncology

## 2012-07-18 ENCOUNTER — Ambulatory Visit (HOSPITAL_BASED_OUTPATIENT_CLINIC_OR_DEPARTMENT_OTHER): Payer: Managed Care, Other (non HMO)

## 2012-07-18 ENCOUNTER — Other Ambulatory Visit: Payer: Self-pay | Admitting: Oncology

## 2012-07-18 VITALS — BP 127/88 | HR 80 | Temp 98.3°F | Resp 20 | Ht 68.0 in | Wt 175.3 lb

## 2012-07-18 DIAGNOSIS — C50419 Malignant neoplasm of upper-outer quadrant of unspecified female breast: Secondary | ICD-10-CM

## 2012-07-18 DIAGNOSIS — Z5112 Encounter for antineoplastic immunotherapy: Secondary | ICD-10-CM

## 2012-07-18 DIAGNOSIS — Z170 Estrogen receptor positive status [ER+]: Secondary | ICD-10-CM

## 2012-07-18 LAB — CBC WITH DIFFERENTIAL/PLATELET
Basophils Absolute: 0 10*3/uL (ref 0.0–0.1)
EOS%: 1.8 % (ref 0.0–7.0)
HCT: 35.7 % (ref 34.8–46.6)
HGB: 11.7 g/dL (ref 11.6–15.9)
MONO#: 0.5 10*3/uL (ref 0.1–0.9)
NEUT#: 2.9 10*3/uL (ref 1.5–6.5)
NEUT%: 64.5 % (ref 38.4–76.8)
RDW: 15.5 % — ABNORMAL HIGH (ref 11.2–14.5)
WBC: 4.4 10*3/uL (ref 3.9–10.3)
lymph#: 1 10*3/uL (ref 0.9–3.3)

## 2012-07-18 LAB — COMPREHENSIVE METABOLIC PANEL (CC13)
ALT: 24 U/L (ref 0–55)
AST: 24 U/L (ref 5–34)
Albumin: 3.8 g/dL (ref 3.5–5.0)
BUN: 14 mg/dL (ref 7.0–26.0)
CO2: 27 mEq/L (ref 22–29)
Calcium: 9.3 mg/dL (ref 8.4–10.4)
Chloride: 104 mEq/L (ref 98–107)
Creatinine: 0.9 mg/dL (ref 0.6–1.1)
Potassium: 4.6 mEq/L (ref 3.5–5.1)

## 2012-07-18 MED ORDER — HEPARIN SOD (PORK) LOCK FLUSH 100 UNIT/ML IV SOLN
500.0000 [IU] | Freq: Once | INTRAVENOUS | Status: AC | PRN
  Administered 2012-07-18: 500 [IU]
  Filled 2012-07-18: qty 5

## 2012-07-18 MED ORDER — SODIUM CHLORIDE 0.9 % IJ SOLN
10.0000 mL | INTRAMUSCULAR | Status: DC | PRN
  Administered 2012-07-18: 10 mL
  Filled 2012-07-18: qty 10

## 2012-07-18 MED ORDER — TRASTUZUMAB CHEMO INJECTION 440 MG
4.0000 mg/kg | Freq: Once | INTRAVENOUS | Status: AC
  Administered 2012-07-18: 315 mg via INTRAVENOUS
  Filled 2012-07-18: qty 15

## 2012-07-18 MED ORDER — SODIUM CHLORIDE 0.9 % IV SOLN
Freq: Once | INTRAVENOUS | Status: AC
  Administered 2012-07-18: 14:00:00 via INTRAVENOUS

## 2012-07-18 MED ORDER — ACETAMINOPHEN 325 MG PO TABS
650.0000 mg | ORAL_TABLET | Freq: Once | ORAL | Status: AC
  Administered 2012-07-18: 650 mg via ORAL

## 2012-07-18 MED ORDER — GABAPENTIN 300 MG PO CAPS: 300.0000 mg | ORAL_CAPSULE | Freq: Every day | ORAL | Status: DC

## 2012-07-18 NOTE — Progress Notes (Signed)
ID: Jody Dunn   DOB: 01/15/1976  MR#: [id number]  CSN#:[id number]  HISTORY OF PRESENT ILLNESS: Jody Dunn had a mammogram at [facility] of 2010 because of shooting pains in the right breast. This found no worrisome finding. More recently, in the spring of 2012, the patient at delivered her second child. She nursed but the child refused to take milk from the right breast. The patient tells me that she has been able to palpate easily from the right breast, so production is not an issue. More recently, early June 2013, the patient felt a new lump in the right breast, and brought this to the attention of [name] at [facility]. The patient had repeat mammography at [facility] 02/07/2012. This found at the breast tissue to be heterogeneously dense, which is not a surprise given that the patient is lactating. There was diffuse skin thickening. Erythema is not described. The nipple was retracted. There was no definitive peau d'orange appearance. Pleomorphic calcifications in the lateral right breast extended approximately 11 cm, without definite mass. On ultrasound there was a vague irregular hypoechoic mass measuring approximately 15 mm in the right breast, with a second ill-defined hypoechoic lobulated mass measuring approximately 17 mm. Both these masses were biopsied 02/09/2012, and the final pathology ([id number]) showed both IIb invasive ductal carcinoma, with a prognostic panel (from the mass at 11:00) as follows: estrogen receptor positive at 90%, progesterone receptor positive at 90%, Mib-1 of 30%, and HER-2 amplification with a ratio of 4.29 by CISH. MRI of the breast was performed 02/15/2012 and showed the mass in the 10:00 position to measure 3.1 cm, the one at the 11:00 position to measure 3.1 cm. There was diffuse skin thickening in the right breast and 2 enhancing abnormal-appearing lower right axillary lymph nodes measuring 2.0 and 1.8 cm respectively. There was no evidence for internal mammary  adenopathy, left axillary adenopathy, or significant findings in the left breast.  INTERVAL HISTORY: Jody Dunn returns today for followup of her locally advanced right breast cancer.  She is currently day 7 cycle 4 of 4 planned dose dense cycles of paclitaxel/trastuzumab.  She continues to tolerate treatment very well, and interval history is generally unremarkable.   REVIEW OF SYSTEMS: Jody Dunn has had no fevers, chills, or night sweats.  She has mild postnasal drip. She denies any significant cough, pleurisy or shortness of breath.  No chest pain or palpitations. For the first time, Jody Dunn began to experience some numbness and tingling in both her fingers and toes just 4 or 5 days ago. Thus far, it is intermittent, and is not affecting any of her day-to-day activities. She describes it as they "tickley" or "tingly" sensation. She's had no swelling.  She denies any recent nausea or emesis. She's had no change in bowel habits. No abnormal headaches or dizziness.  At this time, she denies any unusual myalgias, arthralgias, or bony pain.  A detailed review of systems is otherwise noncontributory.   PAST MEDICAL HISTORY: Past Medical History  Diagnosis Date  . Iron (Fe) deficiency anemia   . Asthma     exercise induced  . Breast cancer   . Back pain   . Migraines   . Lactose intolerance   . Hyperlipidemia     PAST SURGICAL HISTORY: Past Surgical History  Procedure Date  . Appendectomy   . Shoulder surgery   . Knee surgery     X2  . Portacath placement 02/21/2012    Procedure: INSERTION PORT-A-CATH;  Surgeon: Almond Lint,  MD;  Location: [facility];  Service: General;  Laterality: N/A;  . Skin biopsy 02/21/2012    Procedure: BIOPSY SKIN;  Surgeon: Almond Lint, MD;  Location: [facility];  Service: General;  Laterality: Right;  . Breast biopsy 02/09/2012  . Mastectomy modified radical 06/28/2012    Procedure: MASTECTOMY MODIFIED RADICAL;  Surgeon: Almond Lint, MD;  Location: [facility];  Service: General;   Laterality: Right;  Right Modified Radical Mastectomy and Left Simple Mastectomy  . Simple mastectomy with axillary sentinel node biopsy 06/28/2012    Procedure: SIMPLE MASTECTOMY;  Surgeon: Almond Lint, MD;  Location: [facility];  Service: General;  Laterality: Left;    FAMILY HISTORY Family History  Problem Relation Age of Onset  . Cancer Paternal Aunt 71    Breast cancer (half sibling, related through father)  . Spina bifida Paternal Uncle 0    died around 12-56 months of age  . Cancer Maternal Grandmother 73    colon cancer  . Dementia Maternal Grandfather   . Cancer Paternal Grandmother     breast cancer between 34-42  . Cancer Paternal Grandfather     brain cancer  . Cancer Other     Paternal grandmother's siblings had breast cancer and pancreatic cancer  . Cancer Other 30    maternal great grandmother with breast cancer   the patient's parents are alive, in their early 56s. The patient is an only child. There is a significant family history of cancer including breast, brain, and colon, detailed in our genetic counselors report. BRCA and P. 53 testing is negative  GYNECOLOGIC HISTORY: Menarche age 18; she is GX P2, first live birth age 57.  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 Her husband Jody Dunn. works as a Furniture conservator/restorer for [employer]. Their children are Jody Dunn (2008) and Jody Dunn (2012).   ADVANCED DIRECTIVES: not in place  HEALTH MAINTENANCE: History  Substance Use Topics  . Smoking status: Never Smoker   . Smokeless tobacco: Never Used  . Alcohol Use: 2.5 - 3.5 oz/week    5-7 drink(s) per week     Comment: OCCASIONAL     Colonoscopy: no  PAP: 2012  Bone density: no  Lipid panel:  Allergies  Allergen Reactions  . Zofran (Ondansetron Hcl) Other (See Comments)    Migraine headaches and  vomiting  . Antiseptic Products, Misc.     duraprep-rash  . Duraprep (Antiseptic Products, Misc.) Rash  . Penicillins Rash    vomiting    Current Outpatient Prescriptions  Medication Sig Dispense Refill  . naproxen sodium (ANAPROX) 220 MG tablet Take 220 mg by mouth 2 (two) times daily with a meal.      . Probiotic Product (PROBIOTIC & ACIDOPHILUS EX ST) CAPS Take 1 capsule by mouth daily.         OBJECTIVE: Young white woman who appears well and in no acute distress Filed Vitals:   07/18/12 1211  BP: 127/88  Pulse: 80  Temp: 98.3 F (36.8 C)  Resp: 20  ECOG: 0 Filed Weights   07/18/12 1211  Weight: 175 lb 4.8 oz (79.516 kg)   HEENT:  Sclerae anicteric.  Oropharynx clear.   Nodes:  No cervical or supraclavicular lymphadenopathy palpated.  Axillae are benign bilaterally, no adenopathy  Breast Exam:Deferred  Lungs:  Clear to auscultation bilaterally.  No  crackles, rhonchi, or wheezes.   Heart:  Regular rate and rhythm.   Abdomen:  Soft, nontender.  Positive bowel sounds.   Extremities:  No peripheral edema   Neuro:  Nonfocal.  Alert and oriented x3.   LAB RESULTS: Lab Results  Component Value Date   WBC 4.4 07/18/2012   NEUTROABS 2.9 07/18/2012   HGB 11.7 07/18/2012   HCT 35.7 07/18/2012   MCV 93.3 07/18/2012   PLT 316 07/18/2012       Chemistry      Component Value Date/Time   NA 137 06/29/2012 0442   NA 139 06/07/2012 0826   K 3.9 06/29/2012 0442   K 3.9 06/07/2012 0826   CL 102 06/29/2012 0442   CL 106 06/07/2012 0826   CO2 25 06/29/2012 0442   CO2 21* 06/07/2012 0826   BUN 9 06/29/2012 0442   BUN 19.0 06/07/2012 0826   CREATININE 0.59 06/29/2012 0442   CREATININE 0.9 06/07/2012 0826      Component Value Date/Time   CALCIUM 8.5 06/29/2012 0442   CALCIUM 9.4 06/07/2012 0826   ALKPHOS 151* 06/07/2012 0826   ALKPHOS 122* 04/12/2012 0913   AST 24 06/07/2012 0826   AST 19 04/12/2012 0913   ALT 27 06/07/2012 0826   ALT 15 04/12/2012 0913   BILITOT 0.40  06/07/2012 0826   BILITOT 0.2* 04/12/2012 0913       Lab Results  Component Value Date   LABCA2 28 02/16/2012     STUDIES:  Echocardiogram on 04/21/2012 shows an ejection fraction of 55%.     ASSESSMENT: 36 y.o. BRCA-negative [place] woman   (1)  status post right breast biopsy 02/09/2012 for multifocal pT4 cN1, stage IIIB invasive ductal carcinoma. Prognostic profile from one of the 2 masses shows it to be triple positive with an MIB-1 of 30%.   (2)  s/p 4 dose dense cycles of doxorubicin/ cyclophosphamide, followed by 4 cycles of paclitaxel, dose-dense,completed 06/07/2012, given with trastuzumab   (3) trastuzumab to be continued to complete a year (to July 2014). Most recent echo 07/17/2012 shows EF 55%  (4) s/p Right modified radical mastectomy 06/28/2012, showing a residual 2.3 cm invasive ductal carcinoma, grade 3, with 0 of 14 lymph nodes involved.   PLAN:  We discussed the results in detail. While there was a 2.3 area of residual tumor, most of that (per the pathology comments) was ductal carcinoma in situ, with interspersed invasive disease. He negative lymph nodes is reassuring. If we go to the adjuvant! Database, that would have quoted her a risk of dying from this cancer within the next 10 years after chemotherapy and antiestrogen therapy in the 20% range. Because she is also HER-2 positive, the trastuzumab should cut that risk in half, so I quoted her a 10% risk of dying from this cancer within the next 10 years.  Unfortunately her insurance is not accepted by radiation oncologists and she is having to go to [facility] to receive her treatments. She is not pleased by this. In the meantime we're continuing the trastuzumab therapy with her most recent echo showing an ejection fraction in the 55% range. When she completes the radiation we will start tamoxifen. She knows to call for any problems that may develop before the next visit.  MAGRINAT,GUSTAV C    07/18/2012

## 2012-07-18 NOTE — Patient Instructions (Addendum)
[facility] Discharge Instructions for Patients Receiving Chemotherapy  Today you received the following chemotherapy agents: herceptin  To help prevent nausea and vomiting after your treatment, we encourage you to take your nausea medication.  Take it as often as prescribed.     If you develop nausea and vomiting that is not controlled by your nausea medication, call the clinic. If it is after clinic hours your family physician or the after hours number for the clinic or go to the Emergency Department.   BELOW ARE SYMPTOMS THAT SHOULD BE REPORTED IMMEDIATELY:  *FEVER GREATER THAN 100.5 F  *CHILLS WITH OR WITHOUT FEVER  NAUSEA AND VOMITING THAT IS NOT CONTROLLED WITH YOUR NAUSEA MEDICATION  *UNUSUAL SHORTNESS OF BREATH  *UNUSUAL BRUISING OR BLEEDING  TENDERNESS IN MOUTH AND THROAT WITH OR WITHOUT PRESENCE OF ULCERS  *URINARY PROBLEMS  *BOWEL PROBLEMS  UNUSUAL RASH Items with * indicate a potential emergency and should be followed up as soon as possible.  Feel free to call the clinic you have any questions or concerns. The clinic phone number is [phone].   I have been informed and understand all the instructions given to me. I know to contact the clinic, my physician, or go to the Emergency Department if any problems should occur. I do not have any questions at this time, but understand that I may call the clinic during office hours   should I have any questions or need assistance in obtaining follow up care.    __________________________________________  _____________  __________ Signature of Patient or Authorized Representative            Date                   Time    __________________________________________ Nurse's Signature    

## 2012-07-19 ENCOUNTER — Encounter: Payer: Self-pay | Admitting: Oncology

## 2012-07-19 NOTE — Progress Notes (Signed)
Per Dr. Darnelle Catalan advised patient is finished with Neulasta.

## 2012-07-23 ENCOUNTER — Ambulatory Visit: Payer: Self-pay | Admitting: Radiation Oncology

## 2012-07-24 ENCOUNTER — Ambulatory Visit (INDEPENDENT_AMBULATORY_CARE_PROVIDER_SITE_OTHER): Payer: Managed Care, Other (non HMO) | Admitting: General Surgery

## 2012-07-24 VITALS — BP 124/80 | HR 102 | Temp 97.7°F | Resp 20 | Ht 68.0 in | Wt 172.6 lb

## 2012-07-24 DIAGNOSIS — IMO0002 Reserved for concepts with insufficient information to code with codable children: Secondary | ICD-10-CM

## 2012-07-24 NOTE — Progress Notes (Signed)
She presents for urgent office today complaining of tightness and uncomfortable swelling in the right chest wall. Dr. Donell Beers as previously aspirated a seroma post mastectomy.  Exam: Right chest wall incision is clean and intact. There is some tense swelling that is tender. No erythema.  135 cc of serous fluid was sterilely aspirated from the right chest wall.  Assessment: Recurrent seroma right chest wall-aspirated. Plan:  Keep appointment with Dr. Donell Beers in one week. Return if uncomfortable seroma builds up before then.

## 2012-07-24 NOTE — Patient Instructions (Signed)
Wear ace wrap as much as possible.  Call if uncomfortable fluid build up recurs.

## 2012-07-25 ENCOUNTER — Ambulatory Visit: Payer: Self-pay | Admitting: Radiation Oncology

## 2012-07-25 ENCOUNTER — Telehealth (INDEPENDENT_AMBULATORY_CARE_PROVIDER_SITE_OTHER): Payer: Self-pay | Admitting: General Surgery

## 2012-07-25 NOTE — Telephone Encounter (Signed)
Per Dr. Abbey Chatters, pt advised to apply heating pad and try Tramadol for pain.  Monitor for now and call back as needed. Tramadol 50 mg, # 30, 1-2 po Q4H prn pain, no refill called to [facility]:  [phone].

## 2012-07-25 NOTE — Telephone Encounter (Signed)
In office yesterday to see Dr. Abbey Chatters; aspirated seroma.  At home afterwards and overnight experienced "more pain than after the surgery itself."  She states she cannot raise her arm without severe pain now.  She is wearing the wrap and binder.  Cannot tolerate the Hydrocodone without vomiting (even on a full stomach.)  Taking 2 Aleve today.  Concerned about the increased pain after doing so well previously.    Please advise.

## 2012-07-26 ENCOUNTER — Encounter (INDEPENDENT_AMBULATORY_CARE_PROVIDER_SITE_OTHER): Payer: Self-pay | Admitting: Surgery

## 2012-07-26 ENCOUNTER — Telehealth (INDEPENDENT_AMBULATORY_CARE_PROVIDER_SITE_OTHER): Payer: Self-pay | Admitting: General Surgery

## 2012-07-26 ENCOUNTER — Other Ambulatory Visit: Payer: Managed Care, Other (non HMO) | Admitting: Lab

## 2012-07-26 ENCOUNTER — Ambulatory Visit: Payer: Managed Care, Other (non HMO) | Admitting: Oncology

## 2012-07-26 ENCOUNTER — Ambulatory Visit (INDEPENDENT_AMBULATORY_CARE_PROVIDER_SITE_OTHER): Payer: Managed Care, Other (non HMO) | Admitting: Surgery

## 2012-07-26 VITALS — BP 123/80 | HR 78 | Temp 97.4°F | Resp 18 | Ht 68.0 in | Wt 171.0 lb

## 2012-07-26 DIAGNOSIS — Z09 Encounter for follow-up examination after completed treatment for conditions other than malignant neoplasm: Secondary | ICD-10-CM

## 2012-07-26 NOTE — Progress Notes (Signed)
Subjective:     Patient ID: Jody Dunn, female   DOB: 06/24/1976, 36 y.o.   MRN: [id number]  HPI She was seen in the urgent office 2 days ago and had 135 cc of seroma fluid drained. She has now had increasing discomfort along the right chest wall and axilla.  Review of Systems     Objective:   Physical Exam On exam, there is a recurrence trauma as well as some erythema. I inserted an 18-gauge needle and drained 120 cc more of seroma fluid. There was no purulence    Assessment:     Persistent postop seroma    Plan:     I will go ahead and start her on doxycycline. I will have her come back and see the urgent office on Friday. She will then see Dr. Donell Beers on Monday

## 2012-07-26 NOTE — Telephone Encounter (Signed)
Pt called this morning, reporting no improvement of pain since seroma developed, was aspirated and started on Tramadol.  She describes "pain, pressure and tightness again.  Pt is using binder and heating pad as directed.  She also reports increased, "intense" pain when she moves from lying to upright position.  Paged and updated Dr. Donell Beers (DOW this week) to bring her up to date on her pt's visit to Urgent office on Monday, aspiration of 135 ml seroma, and ongoing pain.  Dr. Donell Beers asked for her to be assessed again at Urgent office today.

## 2012-07-28 ENCOUNTER — Ambulatory Visit (INDEPENDENT_AMBULATORY_CARE_PROVIDER_SITE_OTHER): Payer: Managed Care, Other (non HMO) | Admitting: General Surgery

## 2012-07-28 ENCOUNTER — Telehealth (INDEPENDENT_AMBULATORY_CARE_PROVIDER_SITE_OTHER): Payer: Self-pay | Admitting: General Surgery

## 2012-07-28 ENCOUNTER — Encounter (INDEPENDENT_AMBULATORY_CARE_PROVIDER_SITE_OTHER): Payer: Self-pay | Admitting: General Surgery

## 2012-07-28 VITALS — BP 126/78 | HR 88 | Temp 98.5°F | Resp 14 | Ht 68.0 in | Wt 170.0 lb

## 2012-07-28 DIAGNOSIS — IMO0002 Reserved for concepts with insufficient information to code with codable children: Secondary | ICD-10-CM | POA: Insufficient documentation

## 2012-07-28 DIAGNOSIS — C50419 Malignant neoplasm of upper-outer quadrant of unspecified female breast: Secondary | ICD-10-CM

## 2012-07-28 NOTE — Patient Instructions (Signed)
We aspirated only 40 cc from the right mastectomy wound today. There is no evidence of infection. The left mastectomy wound looks fine, and there does not appear to be any fluid.  Keep her appointment with Dr. Donell Beers on Monday.

## 2012-07-28 NOTE — Telephone Encounter (Signed)
Called patient to advise prior appointment for office was cancelled and if she could come in earlier than the 2:45 appointment time. Patient said she will get here as quickly as she can. She is waiting on someone to pick up her daughter around 2:00. Advised patient to come about 2:15 or 2:20. Patient agreed.

## 2012-07-28 NOTE — Progress Notes (Signed)
Patient ID: Jody Dunn, female   DOB: September 28, 1975, 36 y.o.   MRN: [id number] This is Dr. Arita Miss a patient who has had bilateral mastectomies, and is being followed closely because of recurrent wound seroma. She is not having much pain today but notices recurrent fluid on the right. She thinks the left side is fine.  On exam there is no evidence of infection or erythema. There was a fluid collection on the right which I aspirated under sterile prep and got only 40 cc of clear serous fluid. There is no seroma the left side.  Plan I do not think she needs a seroma drain, at least not yet, since the drainage seems to be subsiding. She will see Dr. Donell Beers this coming Monday, 3 days from now.   [name]. Derrell Lolling, M.D., [facility] General and Minimally invasive Surgery Breast and Colorectal Surgery Office:   [phone] Pager:   [phone]

## 2012-07-31 ENCOUNTER — Ambulatory Visit (INDEPENDENT_AMBULATORY_CARE_PROVIDER_SITE_OTHER): Payer: Managed Care, Other (non HMO) | Admitting: General Surgery

## 2012-07-31 ENCOUNTER — Encounter (INDEPENDENT_AMBULATORY_CARE_PROVIDER_SITE_OTHER): Payer: Self-pay | Admitting: General Surgery

## 2012-07-31 VITALS — BP 136/76 | HR 76 | Temp 97.3°F | Resp 16 | Ht 68.0 in | Wt 174.5 lb

## 2012-07-31 DIAGNOSIS — C50419 Malignant neoplasm of upper-outer quadrant of unspecified female breast: Secondary | ICD-10-CM

## 2012-08-03 NOTE — Assessment & Plan Note (Signed)
Very small recurrent seroma.  Would not aspirate given such small amount of fluid.    Will see back in 2 weeks.

## 2012-08-03 NOTE — Progress Notes (Signed)
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 She required reaspiration of fluid last week, but it was much less than before.  Was only 40 mL instead of 90 mL.      EXAM: General:  Alert and oriented Incision:  Tiny small seroma.      ASSESSMENT AND PLAN:   Cancer of upper-outer quadrant of female breast, right breast.  [id number] Very small recurrent seroma.  Would not aspirate given such small amount of fluid.    Will see back in 2 weeks.      Maudry Diego, MD Surgical Oncology, General & Endocrine Surgery [facility]  [name], CNM [name], CNM

## 2012-08-04 ENCOUNTER — Ambulatory Visit (INDEPENDENT_AMBULATORY_CARE_PROVIDER_SITE_OTHER): Payer: Managed Care, Other (non HMO) | Admitting: General Surgery

## 2012-08-04 DIAGNOSIS — C50419 Malignant neoplasm of upper-outer quadrant of unspecified female breast: Secondary | ICD-10-CM

## 2012-08-04 NOTE — Assessment & Plan Note (Signed)
No evidence of recurrent seroma.  Follow up in 3 months.

## 2012-08-04 NOTE — Progress Notes (Signed)
HISTORY: Pt doing well.  She does not feel any recurrent fluid accumulation.  Pain is improved.  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  [id number] No evidence of recurrent seroma.  Follow up in 3 months.        Maudry Diego, MD Surgical Oncology, General & Endocrine Surgery [facility]  [name], CNM [name], CNM

## 2012-08-04 NOTE — Patient Instructions (Signed)
Have a great Christmas!!!  Follow up in 3 months.  Let us know if we can facilitate anything.

## 2012-08-08 ENCOUNTER — Ambulatory Visit: Payer: Managed Care, Other (non HMO)

## 2012-08-08 ENCOUNTER — Ambulatory Visit: Payer: Managed Care, Other (non HMO) | Admitting: Physician Assistant

## 2012-08-08 ENCOUNTER — Other Ambulatory Visit: Payer: Managed Care, Other (non HMO) | Admitting: Lab

## 2012-08-09 ENCOUNTER — Ambulatory Visit (HOSPITAL_BASED_OUTPATIENT_CLINIC_OR_DEPARTMENT_OTHER): Payer: Managed Care, Other (non HMO) | Admitting: Physician Assistant

## 2012-08-09 ENCOUNTER — Other Ambulatory Visit (HOSPITAL_BASED_OUTPATIENT_CLINIC_OR_DEPARTMENT_OTHER): Payer: Managed Care, Other (non HMO) | Admitting: Lab

## 2012-08-09 ENCOUNTER — Encounter: Payer: Self-pay | Admitting: Physician Assistant

## 2012-08-09 ENCOUNTER — Ambulatory Visit (HOSPITAL_BASED_OUTPATIENT_CLINIC_OR_DEPARTMENT_OTHER): Payer: Managed Care, Other (non HMO)

## 2012-08-09 VITALS — BP 136/83 | HR 76 | Temp 98.0°F | Resp 20 | Ht 68.0 in | Wt 174.5 lb

## 2012-08-09 DIAGNOSIS — C50419 Malignant neoplasm of upper-outer quadrant of unspecified female breast: Secondary | ICD-10-CM

## 2012-08-09 DIAGNOSIS — Z9010 Acquired absence of unspecified breast and nipple: Secondary | ICD-10-CM

## 2012-08-09 DIAGNOSIS — Z170 Estrogen receptor positive status [ER+]: Secondary | ICD-10-CM

## 2012-08-09 DIAGNOSIS — Z5112 Encounter for antineoplastic immunotherapy: Secondary | ICD-10-CM

## 2012-08-09 LAB — CBC WITH DIFFERENTIAL/PLATELET
BASO%: 0.2 % (ref 0.0–2.0)
Basophils Absolute: 0 10*3/uL (ref 0.0–0.1)
EOS%: 2.9 % (ref 0.0–7.0)
HGB: 12.1 g/dL (ref 11.6–15.9)
MCH: 30 pg (ref 25.1–34.0)
MCHC: 32.9 g/dL (ref 31.5–36.0)
MCV: 91.1 fL (ref 79.5–101.0)
MONO%: 6.5 % (ref 0.0–14.0)
RBC: 4.04 10*6/uL (ref 3.70–5.45)
RDW: 13.6 % (ref 11.2–14.5)
lymph#: 1.3 10*3/uL (ref 0.9–3.3)

## 2012-08-09 MED ORDER — TRASTUZUMAB CHEMO INJECTION 440 MG
6.0000 mg/kg | Freq: Once | INTRAVENOUS | Status: AC
  Administered 2012-08-09: 483 mg via INTRAVENOUS
  Filled 2012-08-09: qty 23

## 2012-08-09 MED ORDER — HEPARIN SOD (PORK) LOCK FLUSH 100 UNIT/ML IV SOLN
500.0000 [IU] | Freq: Once | INTRAVENOUS | Status: AC | PRN
  Administered 2012-08-09: 500 [IU]
  Filled 2012-08-09: qty 5

## 2012-08-09 MED ORDER — SODIUM CHLORIDE 0.9 % IJ SOLN
10.0000 mL | INTRAMUSCULAR | Status: DC | PRN
  Administered 2012-08-09: 10 mL
  Filled 2012-08-09: qty 10

## 2012-08-09 MED ORDER — ACETAMINOPHEN 325 MG PO TABS
650.0000 mg | ORAL_TABLET | Freq: Once | ORAL | Status: AC
  Administered 2012-08-09: 650 mg via ORAL

## 2012-08-09 MED ORDER — SODIUM CHLORIDE 0.9 % IV SOLN
Freq: Once | INTRAVENOUS | Status: AC
  Administered 2012-08-09: 13:00:00 via INTRAVENOUS

## 2012-08-09 NOTE — Progress Notes (Signed)
ID: Jody Dunn   DOB: [birth year]  MR#: [id number]  EPP#:[id number]  HISTORY OF PRESENT ILLNESS: Jody Dunn had a mammogram at [facility] of 2010 because of shooting pains in the right breast. This found no worrisome finding. More recently, in the spring of 2012, the patient at delivered her second child. She nursed but the child refused to take milk from the right breast. The patient tells me that she has been able to palpate easily from the right breast, so production is not an issue. More recently, early June 2013, the patient felt a new lump in the right breast, and brought this to the attention of Jody Dunn at [facility]. The patient had repeat mammography at [facility] 02/07/2012. This found at the breast tissue to be heterogeneously dense, which is not a surprise given that the patient is lactating. There was diffuse skin thickening. Erythema is not described. The nipple was retracted. There was no definitive peau d'orange appearance. Pleomorphic calcifications in the lateral right breast extended approximately 11 cm, without definite mass. On ultrasound there was a vague irregular hypoechoic mass measuring approximately 15 mm in the right breast, with a second ill-defined hypoechoic lobulated mass measuring approximately 17 mm. Both these masses were biopsied 02/09/2012, and the final pathology ([id number]) showed both IIb invasive ductal carcinoma, with a prognostic panel (from the mass at 11:00) as follows: estrogen receptor positive at 90%, progesterone receptor positive at 90%, Mib-1 of 30%, and HER-2 amplification with a ratio of 4.29 by CISH. MRI of the breast was performed 02/15/2012 and showed the mass in the 10:00 position to measure 3.1 cm, the one at the 11:00 position to measure 3.1 cm. There was diffuse skin thickening in the right breast and 2 enhancing abnormal-appearing lower right axillary lymph nodes measuring 2.0 and 1.8 cm respectively. There was no evidence for internal mammary  adenopathy, left axillary adenopathy, or significant findings in the left breast.  INTERVAL HISTORY: Jody Dunn returns today for followup of her right breast cancer. She status post bilateral mastectomies in early November. She is continuing to receive trastuzumab every 3 weeks, and is due for her next dose today.  Interval history is generally unremarkable, and Jody Dunn has no new complaints today. Her biggest complaint is the fact that she will not be able to receive radiation here in [place] due to her insurance, and will have to go to [place].   REVIEW OF SYSTEMS: Jody Dunn has had no fevers, chills, or night sweats.  She's eating well with no nausea or change in bowel habits. She denies any significant cough, pleurisy or shortness of breath.  No chest pain or palpitations. She's had no swelling, and denies any peripheral neuropathy. No abnormal headaches or dizziness.  At this time, she denies any unusual myalgias, arthralgias, or bony pain.  A detailed review of systems is otherwise noncontributory.   PAST MEDICAL HISTORY: Past Medical History  Diagnosis Date  . Iron (Fe) deficiency anemia   . Asthma     exercise induced  . Breast cancer   . Back pain   . Migraines   . Lactose intolerance   . Hyperlipidemia     PAST SURGICAL HISTORY: Past Surgical History  Procedure Date  . Appendectomy   . Shoulder surgery   . Knee surgery     X2  . Portacath placement 02/21/2012    Procedure: INSERTION PORT-A-CATH;  Surgeon: Jody Lint, MD;  Location: [facility];  Service: General;  Laterality: N/A;  . Skin biopsy 02/21/2012  Procedure: BIOPSY SKIN;  Surgeon: Jody Lint, MD;  Location: [facility];  Service: General;  Laterality: Right;  . Breast biopsy 02/09/2012  . Mastectomy modified radical 06/28/2012    Procedure: MASTECTOMY MODIFIED RADICAL;  Surgeon: Jody Lint, MD;  Location: [facility];  Service: General;  Laterality: Right;  Right Modified Radical Mastectomy and Left Simple Mastectomy  .  Simple mastectomy with axillary sentinel node biopsy 06/28/2012    Procedure: SIMPLE MASTECTOMY;  Surgeon: Jody Lint, MD;  Location: [facility];  Service: General;  Laterality: Left;    FAMILY HISTORY Family History  Problem Relation Age of Onset  . Cancer Paternal Aunt 54    Breast cancer (half sibling, related through father)  . Spina bifida Paternal Uncle 0    died around 81-21 months of age  . Cancer Maternal Grandmother 20    colon cancer  . Dementia Maternal Grandfather   . Cancer Paternal Grandmother     breast cancer between 60-42  . Cancer Paternal Grandfather     brain cancer  . Cancer Other     Paternal grandmother's siblings had breast cancer and pancreatic cancer  . Cancer Other 70    maternal great grandmother with breast cancer   the patient's parents are alive, in their early 90s. The patient is an only child. There is a significant family history of cancer including breast, brain, and colon, detailed in our genetic counselors report. BRCA and P. 53 testing is negative  GYNECOLOGIC HISTORY: Menarche age 59; she is GX P2, first live birth age 75.  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 Her husband Jody Dunn "Jody Dunn" Jody Dunn. works as a Furniture conservator/restorer for [employer]. Their children are Jody Dunn (2008) and Jody Dunn (2012).   ADVANCED DIRECTIVES: not in place  HEALTH MAINTENANCE: History  Substance Use Topics  . Smoking status: Never Smoker   . Smokeless tobacco: Never Used  . Alcohol Use: 2.5 - 3.5 oz/week    5-7 drink(s) per week     Comment: OCCASIONAL     Colonoscopy: no  PAP: 2012  Bone density: no  Lipid panel:  Allergies  Allergen Reactions  . Zofran (Ondansetron Hcl) Other (See Comments)    Migraine headaches and vomiting  . Antiseptic Products, Misc.     duraprep-rash  . Duraprep (Antiseptic  Products, Misc.) Rash  . Penicillins Rash    vomiting    Current Outpatient Prescriptions  Medication Sig Dispense Refill  . doxycycline (VIBRAMYCIN) 100 MG capsule BID times 48H.      Marland Kitchen gabapentin (NEURONTIN) 300 MG capsule Take 1 capsule (300 mg total) by mouth at bedtime.  90 capsule  4  . levofloxacin (LEVAQUIN) 500 MG tablet daily.      . naproxen sodium (ANAPROX) 220 MG tablet Take 220 mg by mouth 2 (two) times daily with a meal.      . Probiotic Product (PROBIOTIC & ACIDOPHILUS EX ST) CAPS Take 1 capsule by mouth daily.       . traMADol (ULTRAM) 50 MG tablet daily.       No current facility-administered medications for this visit.   Facility-Administered Medications Ordered in Other Visits  Medication Dose Route Frequency Provider Last Rate Last Dose  . 0.9 %  sodium chloride infusion   Intravenous Once Jody Gravel, PA      . acetaminophen (  TYLENOL) tablet 650 mg  650 mg Oral Once [employer], PA      . heparin lock flush 100 unit/mL  500 Units Intracatheter Once PRN Jody Gravel, PA      . sodium chloride 0.9 % injection 10 mL  10 mL Intracatheter PRN Peyson Postema Allegra Grana, PA        OBJECTIVE: Young white woman who appears well and in no acute distress Filed Vitals:   08/09/12 1043  BP: 136/83  Pulse: 76  Temp: 98 F (36.7 C)  Resp: 20  ECOG: 0 Filed Weights   08/09/12 1043  Weight: 174 lb 8 oz (79.153 kg)   HEENT:  Sclerae anicteric.  Oropharynx clear.   Nodes:  No cervical or supraclavicular lymphadenopathy palpated.  Axillae are benign bilaterally, no adenopathy  Breast Exam: Patient status post bilateral mastectomies with well-healed incisions.  Lungs:  Clear to auscultation bilaterally.  No crackles, rhonchi, or wheezes.   Heart:  Regular rate and rhythm.   Abdomen:  Soft, nontender.  Positive bowel sounds.   Extremities:  No peripheral edema   Neuro:  Nonfocal.  Alert and oriented x3.   LAB RESULTS: Lab Results  Component Value Date   WBC 4.1 08/09/2012    NEUTROABS 2.5 08/09/2012   HGB 12.1 08/09/2012   HCT 36.8 08/09/2012   MCV 91.1 08/09/2012   PLT 353 08/09/2012       Chemistry      Component Value Date/Time   NA 141 07/18/2012 1157   NA 137 06/29/2012 0442   K 4.6 07/18/2012 1157   K 3.9 06/29/2012 0442   CL 104 07/18/2012 1157   CL 102 06/29/2012 0442   CO2 27 07/18/2012 1157   CO2 25 06/29/2012 0442   BUN 14.0 07/18/2012 1157   BUN 9 06/29/2012 0442   CREATININE 0.9 07/18/2012 1157   CREATININE 0.59 06/29/2012 0442      Component Value Date/Time   CALCIUM 9.3 07/18/2012 1157   CALCIUM 8.5 06/29/2012 0442   ALKPHOS 96 07/18/2012 1157   ALKPHOS 122* 04/12/2012 0913   AST 24 07/18/2012 1157   AST 19 04/12/2012 0913   ALT 24 07/18/2012 1157   ALT 15 04/12/2012 0913   BILITOT 0.27 07/18/2012 1157   BILITOT 0.2* 04/12/2012 0913       Lab Results  Component Value Date   LABCA2 28 02/16/2012     STUDIES:  Echocardiogram on 07/17/2012 shows an ejection fraction of 55%.     ASSESSMENT: 36 y.o. BRCA-negative [place] woman   (1)  status post right breast biopsy 02/09/2012 for multifocal pT4 cN1, stage IIIB invasive ductal carcinoma. Prognostic profile from one of the 2 masses shows it to be triple positive with an MIB-1 of 30%.   (2)  s/p 4 dose dense cycles of doxorubicin/ cyclophosphamide, followed by 4 cycles of paclitaxel, dose-dense,completed 06/07/2012, given with trastuzumab   (3) trastuzumab to be continued to complete a year (to July 2014). Most recent echo 07/17/2012 shows EF 55%  (4) s/p Right modified radical mastectomy 06/28/2012, showing a residual 2.3 cm invasive ductal carcinoma, grade 3, with 0 of 14 lymph nodes involved.  (5)  To receive postmastectomy radiation in [place], beginning January 2014.   PLAN:  Ashaki will proceed to treatment today as scheduled for her next q. three-week dose of trastuzumab.  She'll be seen in [place] for radiation therapy, and plans to initiate radiation in January.  We will continue to treat her here on  a Q. three-week basis, and once she completes radiation, we will begin tamoxifen.   She is due for her next echocardiogram in February, and that is being scheduled as well.  She knows to call for any problems that may develop before the next visit.  Janequa Kipnis    08/09/2012

## 2012-08-09 NOTE — Patient Instructions (Addendum)
[facility] Discharge Instructions for Patients Receiving Chemotherapy  Today you received the following chemotherapy agents Herceptin  To help prevent nausea and vomiting after your treatment, we encourage you to take your nausea medication as prescribed.   If you develop nausea and vomiting that is not controlled by your nausea medication, call the clinic. If it is after clinic hours your family physician or the after hours number for the clinic or go to the Emergency Department.   BELOW ARE SYMPTOMS THAT SHOULD BE REPORTED IMMEDIATELY:  *FEVER GREATER THAN 100.5 F  *CHILLS WITH OR WITHOUT FEVER  NAUSEA AND VOMITING THAT IS NOT CONTROLLED WITH YOUR NAUSEA MEDICATION  *UNUSUAL SHORTNESS OF BREATH  *UNUSUAL BRUISING OR BLEEDING  TENDERNESS IN MOUTH AND THROAT WITH OR WITHOUT PRESENCE OF ULCERS  *URINARY PROBLEMS  *BOWEL PROBLEMS  UNUSUAL RASH Items with * indicate a potential emergency and should be followed up as soon as possible.  Feel free to call the clinic you have any questions or concerns. The clinic phone number is [phone].   I have been informed and understand all the instructions given to me. I know to contact the clinic, my physician, or go to the Emergency Department if any problems should occur. I do not have any questions at this time, but understand that I may call the clinic during office hours   should I have any questions or need assistance in obtaining follow up care.   

## 2012-08-10 ENCOUNTER — Telehealth: Payer: Self-pay | Admitting: *Deleted

## 2012-08-10 NOTE — Telephone Encounter (Signed)
Per staff message and POF I have scheduled appts.  JMW  

## 2012-08-10 NOTE — Telephone Encounter (Signed)
lab and Herceptin 08/30/2012; lab, AB and Herceptin 09/20/2012; lab, GMand Herceptin 10/11/12  Echo and Dr, Gala Romney in Feb 2014  Sent michelle email to set up treatment  Patient aware of all the appointments

## 2012-08-21 ENCOUNTER — Telehealth: Payer: Self-pay | Admitting: *Deleted

## 2012-08-21 NOTE — Telephone Encounter (Signed)
Per staff message I have adjusted appts. JM W 

## 2012-08-23 ENCOUNTER — Ambulatory Visit: Payer: Self-pay | Admitting: Radiation Oncology

## 2012-08-30 ENCOUNTER — Other Ambulatory Visit: Payer: Managed Care, Other (non HMO) | Admitting: Lab

## 2012-08-30 ENCOUNTER — Other Ambulatory Visit (HOSPITAL_BASED_OUTPATIENT_CLINIC_OR_DEPARTMENT_OTHER): Payer: Managed Care, Other (non HMO)

## 2012-08-30 ENCOUNTER — Ambulatory Visit (HOSPITAL_BASED_OUTPATIENT_CLINIC_OR_DEPARTMENT_OTHER): Payer: Managed Care, Other (non HMO)

## 2012-08-30 VITALS — BP 123/70 | HR 84 | Temp 98.1°F

## 2012-08-30 DIAGNOSIS — C50419 Malignant neoplasm of upper-outer quadrant of unspecified female breast: Secondary | ICD-10-CM

## 2012-08-30 DIAGNOSIS — Z5112 Encounter for antineoplastic immunotherapy: Secondary | ICD-10-CM

## 2012-08-30 LAB — CBC WITH DIFFERENTIAL/PLATELET
BASO%: 0.3 % (ref 0.0–2.0)
EOS%: 1.1 % (ref 0.0–7.0)
Eosinophils Absolute: 0.1 10*3/uL (ref 0.0–0.5)
LYMPH%: 22.8 % (ref 14.0–49.7)
MCHC: 33.4 g/dL (ref 31.5–36.0)
MCV: 89.4 fL (ref 79.5–101.0)
MONO%: 5.7 % (ref 0.0–14.0)
NEUT#: 5.1 10*3/uL (ref 1.5–6.5)
Platelets: 230 10*3/uL (ref 145–400)
RBC: 4.25 10*6/uL (ref 3.70–5.45)
RDW: 15.5 % — ABNORMAL HIGH (ref 11.2–14.5)

## 2012-08-30 MED ORDER — TRASTUZUMAB CHEMO INJECTION 440 MG
6.0000 mg/kg | Freq: Once | INTRAVENOUS | Status: AC
  Administered 2012-08-30: 483 mg via INTRAVENOUS
  Filled 2012-08-30: qty 23

## 2012-08-30 MED ORDER — ACETAMINOPHEN 325 MG PO TABS
650.0000 mg | ORAL_TABLET | Freq: Once | ORAL | Status: AC
  Administered 2012-08-30: 650 mg via ORAL

## 2012-08-30 MED ORDER — SODIUM CHLORIDE 0.9 % IJ SOLN
10.0000 mL | INTRAMUSCULAR | Status: DC | PRN
  Filled 2012-08-30: qty 10

## 2012-08-30 MED ORDER — SODIUM CHLORIDE 0.9 % IV SOLN
Freq: Once | INTRAVENOUS | Status: AC
  Administered 2012-08-30: 12:00:00 via INTRAVENOUS

## 2012-08-30 MED ORDER — HEPARIN SOD (PORK) LOCK FLUSH 100 UNIT/ML IV SOLN
500.0000 [IU] | Freq: Once | INTRAVENOUS | Status: DC | PRN
  Filled 2012-08-30: qty 5

## 2012-08-30 NOTE — Patient Instructions (Signed)
[facility] Discharge Instructions for Patients Receiving Chemotherapy  Today you received the following chemotherapy agents Herceptin To help prevent nausea and vomiting after your treatment, we encourage you to take your nausea medication as prescribed.  If you develop nausea and vomiting that is not controlled by your nausea medication, call the clinic. If it is after clinic hours your family physician or the after hours number for the clinic or go to the Emergency Department.   BELOW ARE SYMPTOMS THAT SHOULD BE REPORTED IMMEDIATELY:  *FEVER GREATER THAN 100.5 F  *CHILLS WITH OR WITHOUT FEVER  NAUSEA AND VOMITING THAT IS NOT CONTROLLED WITH YOUR NAUSEA MEDICATION  *UNUSUAL SHORTNESS OF BREATH  *UNUSUAL BRUISING OR BLEEDING  TENDERNESS IN MOUTH AND THROAT WITH OR WITHOUT PRESENCE OF ULCERS  *URINARY PROBLEMS  *BOWEL PROBLEMS  UNUSUAL RASH Items with * indicate a potential emergency and should be followed up as soon as possible.  One of the nurses will contact you 24 hours after your treatment. Please let the nurse know about any problems that you may have experienced. Feel free to call the clinic you have any questions or concerns. The clinic phone number is [phone].   I have been informed and understand all the instructions given to me. I know to contact the clinic, my physician, or go to the Emergency Department if any problems should occur. I do not have any questions at this time, but understand that I may call the clinic during office hours   should I have any questions or need assistance in obtaining follow up care.    __________________________________________  _____________  __________ Signature of Patient or Authorized Representative            Date                   Time    __________________________________________ Nurse's Signature    

## 2012-09-08 LAB — CBC CANCER CENTER
Basophil #: 0 x10 3/mm (ref 0.0–0.1)
Basophil %: 0.5 %
Eosinophil #: 0.1 x10 3/mm (ref 0.0–0.7)
HGB: 12.7 g/dL (ref 12.0–16.0)
Lymphocyte #: 1.1 x10 3/mm (ref 1.0–3.6)
MCHC: 34.3 g/dL (ref 32.0–36.0)
Monocyte #: 0.4 x10 3/mm (ref 0.2–0.9)
Monocyte %: 7.6 %
Neutrophil #: 3.7 x10 3/mm (ref 1.4–6.5)
Platelet: 280 x10 3/mm (ref 150–440)
RBC: 4.17 10*6/uL (ref 3.80–5.20)
WBC: 5.3 x10 3/mm (ref 3.6–11.0)

## 2012-09-15 LAB — CBC CANCER CENTER
Eosinophil %: 2 %
HCT: 36.4 % (ref 35.0–47.0)
Lymphocyte #: 1.3 x10 3/mm (ref 1.0–3.6)
Lymphocyte %: 20.8 %
MCHC: 34.2 g/dL (ref 32.0–36.0)
MCV: 89 fL (ref 80–100)
Monocyte #: 0.5 x10 3/mm (ref 0.2–0.9)
Monocyte %: 8.4 %
Neutrophil %: 68.2 %
Platelet: 268 x10 3/mm (ref 150–440)
RBC: 4.12 10*6/uL (ref 3.80–5.20)
RDW: 15.6 % — ABNORMAL HIGH (ref 11.5–14.5)
WBC: 6.1 x10 3/mm (ref 3.6–11.0)

## 2012-09-20 ENCOUNTER — Ambulatory Visit: Payer: Managed Care, Other (non HMO) | Admitting: Physician Assistant

## 2012-09-20 ENCOUNTER — Other Ambulatory Visit: Payer: Managed Care, Other (non HMO) | Admitting: Lab

## 2012-09-20 ENCOUNTER — Encounter: Payer: Self-pay | Admitting: Physician Assistant

## 2012-09-20 ENCOUNTER — Other Ambulatory Visit (HOSPITAL_BASED_OUTPATIENT_CLINIC_OR_DEPARTMENT_OTHER): Payer: Managed Care, Other (non HMO) | Admitting: Lab

## 2012-09-20 ENCOUNTER — Telehealth: Payer: Self-pay | Admitting: Oncology

## 2012-09-20 ENCOUNTER — Ambulatory Visit (HOSPITAL_BASED_OUTPATIENT_CLINIC_OR_DEPARTMENT_OTHER): Payer: Managed Care, Other (non HMO) | Admitting: Physician Assistant

## 2012-09-20 ENCOUNTER — Ambulatory Visit (HOSPITAL_BASED_OUTPATIENT_CLINIC_OR_DEPARTMENT_OTHER): Payer: Managed Care, Other (non HMO)

## 2012-09-20 VITALS — BP 131/83 | HR 88 | Temp 98.3°F | Resp 20 | Ht 68.0 in | Wt 181.6 lb

## 2012-09-20 DIAGNOSIS — Z170 Estrogen receptor positive status [ER+]: Secondary | ICD-10-CM

## 2012-09-20 DIAGNOSIS — C50419 Malignant neoplasm of upper-outer quadrant of unspecified female breast: Secondary | ICD-10-CM

## 2012-09-20 DIAGNOSIS — Z5112 Encounter for antineoplastic immunotherapy: Secondary | ICD-10-CM

## 2012-09-20 LAB — COMPREHENSIVE METABOLIC PANEL (CC13)
ALT: 22 U/L (ref 0–55)
Albumin: 3.8 g/dL (ref 3.5–5.0)
CO2: 21 mEq/L — ABNORMAL LOW (ref 22–29)
Glucose: 68 mg/dl — ABNORMAL LOW (ref 70–99)
Potassium: 3.4 mEq/L — ABNORMAL LOW (ref 3.5–5.1)
Sodium: 140 mEq/L (ref 136–145)
Total Bilirubin: 0.21 mg/dL (ref 0.20–1.20)
Total Protein: 7.9 g/dL (ref 6.4–8.3)

## 2012-09-20 LAB — CBC WITH DIFFERENTIAL/PLATELET
BASO%: 0.7 % (ref 0.0–2.0)
Eosinophils Absolute: 0.2 10*3/uL (ref 0.0–0.5)
LYMPH%: 21.1 % (ref 14.0–49.7)
MCHC: 34.7 g/dL (ref 31.5–36.0)
MONO#: 0.6 10*3/uL (ref 0.1–0.9)
NEUT#: 3.5 10*3/uL (ref 1.5–6.5)
RBC: 3.99 10*6/uL (ref 3.70–5.45)
RDW: 16.2 % — ABNORMAL HIGH (ref 11.2–14.5)
WBC: 5.6 10*3/uL (ref 3.9–10.3)
lymph#: 1.2 10*3/uL (ref 0.9–3.3)

## 2012-09-20 MED ORDER — SODIUM CHLORIDE 0.9 % IJ SOLN
10.0000 mL | INTRAMUSCULAR | Status: DC | PRN
  Administered 2012-09-20: 10 mL
  Filled 2012-09-20: qty 10

## 2012-09-20 MED ORDER — TRASTUZUMAB CHEMO INJECTION 440 MG
6.0000 mg/kg | Freq: Once | INTRAVENOUS | Status: AC
  Administered 2012-09-20: 483 mg via INTRAVENOUS
  Filled 2012-09-20: qty 23

## 2012-09-20 MED ORDER — HEPARIN SOD (PORK) LOCK FLUSH 100 UNIT/ML IV SOLN
500.0000 [IU] | Freq: Once | INTRAVENOUS | Status: AC | PRN
  Administered 2012-09-20: 500 [IU]
  Filled 2012-09-20: qty 5

## 2012-09-20 MED ORDER — ACETAMINOPHEN 325 MG PO TABS
650.0000 mg | ORAL_TABLET | Freq: Once | ORAL | Status: AC
  Administered 2012-09-20: 650 mg via ORAL

## 2012-09-20 MED ORDER — SODIUM CHLORIDE 0.9 % IV SOLN
Freq: Once | INTRAVENOUS | Status: AC
  Administered 2012-09-20: 15:00:00 via INTRAVENOUS

## 2012-09-20 NOTE — Progress Notes (Signed)
ID: Jody Dunn   DOB: 1975/12/16  MR#: [id number]  WUJ#:[id number]  HISTORY OF PRESENT ILLNESS: Jody Dunn had a mammogram at [facility] of 2010 because of shooting pains in the right breast. This found no worrisome finding. More recently, in the spring of 2012, the patient at delivered her second child. She nursed but the child refused to take milk from the right breast. The patient tells me that she has been able to palpate easily from the right breast, so production is not an issue. More recently, early June 2013, the patient felt a new lump in the right breast, and brought this to the attention of Jody Dunn at [facility]. The patient had repeat mammography at [facility] 02/07/2012. This found at the breast tissue to be heterogeneously dense, which is not a surprise given that the patient is lactating. There was diffuse skin thickening. Erythema is not described. The nipple was retracted. There was no definitive peau d'orange appearance. Pleomorphic calcifications in the lateral right breast extended approximately 11 cm, without definite mass. On ultrasound there was a vague irregular hypoechoic mass measuring approximately 15 mm in the right breast, with a second ill-defined hypoechoic lobulated mass measuring approximately 17 mm. Both these masses were biopsied 02/09/2012, and the final pathology ([id number]) showed both IIb invasive ductal carcinoma, with a prognostic panel (from the mass at 11:00) as follows: estrogen receptor positive at 90%, progesterone receptor positive at 90%, Mib-1 of 30%, and HER-2 amplification with a ratio of 4.29 by CISH. MRI of the breast was performed 02/15/2012 and showed the mass in the 10:00 position to measure 3.1 cm, the one at the 11:00 position to measure 3.1 cm. There was diffuse skin thickening in the right breast and 2 enhancing abnormal-appearing lower right axillary lymph nodes measuring 2.0 and 1.8 cm respectively. There was no evidence for internal mammary  adenopathy, left axillary adenopathy, or significant findings in the left breast.  INTERVAL HISTORY: Jody Dunn returns today for followup of her right breast cancer. She status post bilateral mastectomies in early November. She is continuing to receive trastuzumab every 3 weeks, and is due for her next dose today.  She is receiving active radiation therapy in [place] and is tolerating treatment well.  Interval history is generally unremarkable, and Jody Dunn has no new complaints today.    REVIEW OF Dunn: Jody Dunn has had no fevers, chills, or night sweats.  She denies any rashes, skin changes, or abnormal bleeding. She's eating well with no nausea or change in bowel habits. She denies any significant cough, pleurisy, shortness of breath or orthopnea.  No chest pain or palpitations. She's had no swelling, and denies any peripheral neuropathy. No abnormal headaches or dizziness.  She denies any unusual myalgias, arthralgias, or bony pain.  A detailed review of Dunn is otherwise noncontributory.   PAST MEDICAL HISTORY: Past Medical History  Diagnosis Date  . Iron (Fe) deficiency anemia   . Asthma     exercise induced  . Breast cancer   . Back pain   . Migraines   . Lactose intolerance   . Hyperlipidemia     PAST SURGICAL HISTORY: Past Surgical History  Procedure Date  . Appendectomy   . Shoulder surgery   . Knee surgery     X2  . Portacath placement 02/21/2012    Procedure: INSERTION PORT-A-CATH;  Surgeon: Jody Lint, MD;  Location: [facility];  Service: General;  Laterality: N/A;  . Skin biopsy 02/21/2012    Procedure: BIOPSY SKIN;  Surgeon: Jody Lint, MD;  Location: [facility];  Service: General;  Laterality: Right;  . Breast biopsy 02/09/2012  . Mastectomy modified radical 06/28/2012    Procedure: MASTECTOMY MODIFIED RADICAL;  Surgeon: Jody Lint, MD;  Location: [facility];  Service: General;  Laterality: Right;  Right Modified Radical Mastectomy and Left Simple Mastectomy  . Simple  mastectomy with axillary sentinel node biopsy 06/28/2012    Procedure: SIMPLE MASTECTOMY;  Surgeon: Jody Lint, MD;  Location: [facility];  Service: General;  Laterality: Left;    FAMILY HISTORY Family History  Problem Relation Age of Onset  . Cancer Paternal Aunt 34    Breast cancer (half sibling, related through father)  . Spina bifida Paternal Uncle 0    died around 67-63 months of age  . Cancer Maternal Grandmother 77    colon cancer  . Dementia Maternal Grandfather   . Cancer Paternal Grandmother     breast cancer between 21-42  . Cancer Paternal Grandfather     brain cancer  . Cancer Other     Paternal grandmother's siblings had breast cancer and pancreatic cancer  . Cancer Other 10    maternal great grandmother with breast cancer   the patient's parents are alive, in their early 10s. The patient is an only child. There is a significant family history of cancer including breast, brain, and colon, detailed in our genetic counselors report. BRCA and P. 53 testing is negative  GYNECOLOGIC HISTORY: Menarche age 82; she is GX P2, first live birth age 83.  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 Her husband Jody Dunn "Jody Dunn" Jody Dunn. works as a Furniture conservator/restorer for [employer]. Their children are Jody Dunn (2008) and Jody Dunn (2012).   ADVANCED DIRECTIVES: not in place  HEALTH MAINTENANCE: History  Substance Use Topics  . Smoking status: Never Smoker   . Smokeless tobacco: Never Used  . Alcohol Use: 2.5 - 3.5 oz/week    5-7 drink(s) per week     Comment: OCCASIONAL     Colonoscopy: no  PAP: 2012  Bone density: no  Lipid panel:  Allergies  Allergen Reactions  . Zofran (Ondansetron Hcl) Other (See Comments)    Migraine headaches and vomiting  . Antiseptic Products, Misc.     duraprep-rash  . Duraprep (Antiseptic Products,  Misc.) Rash  . Penicillins Rash    vomiting    Current Outpatient Prescriptions  Medication Sig Dispense Refill  . naproxen sodium (ANAPROX) 220 MG tablet Take 220 mg by mouth 2 (two) times daily with a meal.      . Probiotic Product (PROBIOTIC & ACIDOPHILUS EX ST) CAPS Take 1 capsule by mouth daily.       Marland Kitchen doxycycline (VIBRAMYCIN) 100 MG capsule BID times 48H.      Marland Kitchen gabapentin (NEURONTIN) 300 MG capsule Take 1 capsule (300 mg total) by mouth at bedtime.  90 capsule  4  . levofloxacin (LEVAQUIN) 500 MG tablet daily.      . traMADol (ULTRAM) 50 MG tablet daily.        OBJECTIVE: Young white woman who appears well and in no acute distress Filed Vitals:   09/20/12 1259  BP: 131/83  Pulse: 88  Temp: 98.3 F (36.8 C)  Resp: 20  ECOG: 0 Filed Weights   09/20/12 1259  Weight: 181 lb 9.6 oz (82.373 kg)  HEENT:  Sclerae anicteric.  Oropharynx clear.   Nodes:  No cervical or supraclavicular lymphadenopathy palpated.  Axillae are benign bilaterally, no adenopathy  Breast Exam: Patient status post bilateral mastectomies with well-healed incisions. Very mild hyperpigmentation in the right secondary to radiation. No additional skin changes are noted, specifically no moist desquamation Lungs:  Clear to auscultation bilaterally.  No crackles, rhonchi, or wheezes.   Heart:  Regular rate and rhythm.   Abdomen:  Soft, nontender.  Positive bowel sounds.   Extremities:  No peripheral edema   Neuro:  Nonfocal.  Well oriented.   LAB RESULTS: Lab Results  Component Value Date   WBC 5.6 09/20/2012   NEUTROABS 3.5 09/20/2012   HGB 12.3 09/20/2012   HCT 35.4 09/20/2012   MCV 88.7 09/20/2012   PLT 231 09/20/2012       Chemistry      Component Value Date/Time   NA 141 07/18/2012 1157   NA 137 06/29/2012 0442   K 4.6 07/18/2012 1157   K 3.9 06/29/2012 0442   CL 104 07/18/2012 1157   CL 102 06/29/2012 0442   CO2 27 07/18/2012 1157   CO2 25 06/29/2012 0442   BUN 14.0 07/18/2012 1157   BUN 9  06/29/2012 0442   CREATININE 0.9 07/18/2012 1157   CREATININE 0.59 06/29/2012 0442      Component Value Date/Time   CALCIUM 9.3 07/18/2012 1157   CALCIUM 8.5 06/29/2012 0442   ALKPHOS 96 07/18/2012 1157   ALKPHOS 122* 04/12/2012 0913   AST 24 07/18/2012 1157   AST 19 04/12/2012 0913   ALT 24 07/18/2012 1157   ALT 15 04/12/2012 0913   BILITOT 0.27 07/18/2012 1157   BILITOT 0.2* 04/12/2012 0913       Lab Results  Component Value Date   LABCA2 28 02/16/2012     STUDIES:  Echocardiogram on 07/17/2012 shows an ejection fraction of 55%.     ASSESSMENT: 37 y.o. BRCA-negative [place] woman   (1)  status post right breast biopsy 02/09/2012 for multifocal pT4 cN1, stage IIIB invasive ductal carcinoma. Prognostic profile from one of the 2 masses shows it to be triple positive with an MIB-1 of 30%.   (2)  s/p 4 dose dense cycles of doxorubicin/ cyclophosphamide, followed by 4 cycles of paclitaxel, dose-dense,completed 06/07/2012, given with trastuzumab   (3) trastuzumab to be continued to complete a year (to July 2014). Most recent echo 07/17/2012 shows EF 55%  (4) s/p Right modified radical mastectomy 06/28/2012, showing a residual 2.3 cm invasive ductal carcinoma, grade 3, with 0 of 14 lymph nodes involved.  (5)  Receiving radiation in [place], beginning January 2014.  (6)  will be starting on tamoxifen after completion of radiation therapy.  PLAN:  Kalany will proceed to treatment today as scheduled for her next q. three-week dose of trastuzumab.  Of course she'll continue radiation in [place] as planned, and will complete her therapy in late February. She's already scheduled to see Dr. Darnelle Catalan here on February 20 when she returns for trastuzumab, and at that time they will discuss beginning Annora's tamoxifen therapy.   She is already scheduled to followup with Dr. Donell Beers in late February, and is scheduled for an echocardiogram in early March.  She knows to call for any  problems that may develop before the next visit.  Erleen Egner    09/20/2012

## 2012-09-20 NOTE — Telephone Encounter (Signed)
gv pt appt schedule for February thru April.  °

## 2012-09-20 NOTE — Patient Instructions (Addendum)
[facility] Discharge Instructions for Patients Receiving Chemotherapy  Today you received the following chemotherapy agents Herceptin To help prevent nausea and vomiting after your treatment, we encourage you to take your nausea medication as directed  If you develop nausea and vomiting that is not controlled by your nausea medication, call the clinic. If it is after clinic hours your family physician or the after hours number for the clinic or go to the Emergency Department.   BELOW ARE SYMPTOMS THAT SHOULD BE REPORTED IMMEDIATELY:  *FEVER GREATER THAN 100.5 F  *CHILLS WITH OR WITHOUT FEVER  NAUSEA AND VOMITING THAT IS NOT CONTROLLED WITH YOUR NAUSEA MEDICATION  *UNUSUAL SHORTNESS OF BREATH  *UNUSUAL BRUISING OR BLEEDING  TENDERNESS IN MOUTH AND THROAT WITH OR WITHOUT PRESENCE OF ULCERS  *URINARY PROBLEMS  *BOWEL PROBLEMS  UNUSUAL RASH Items with * indicate a potential emergency and should be followed up as soon as possible.  One of the nurses will contact you 24 hours after your treatment. Please let the nurse know about any problems that you may have experienced. Feel free to call the clinic you have any questions or concerns. The clinic phone number is [phone].   I have been informed and understand all the instructions given to me. I know to contact the clinic, my physician, or go to the Emergency Department if any problems should occur. I do not have any questions at this time, but understand that I may call the clinic during office hours   should I have any questions or need assistance in obtaining follow up care.    __________________________________________  _____________  __________ Signature of Patient or Authorized Representative            Date                   Time    __________________________________________ Nurse's Signature    

## 2012-09-23 ENCOUNTER — Ambulatory Visit: Payer: Self-pay | Admitting: Radiation Oncology

## 2012-09-29 LAB — CBC CANCER CENTER
Basophil #: 0 x10 3/mm (ref 0.0–0.1)
Eosinophil #: 0.1 x10 3/mm (ref 0.0–0.7)
Eosinophil %: 1.2 %
HCT: 37.7 % (ref 35.0–47.0)
HGB: 12.9 g/dL (ref 12.0–16.0)
Lymphocyte #: 1 x10 3/mm (ref 1.0–3.6)
MCH: 30.5 pg (ref 26.0–34.0)
MCV: 89 fL (ref 80–100)
Monocyte #: 0.6 x10 3/mm (ref 0.2–0.9)
Monocyte %: 10 %
Neutrophil #: 3.9 x10 3/mm (ref 1.4–6.5)
Neutrophil %: 70.6 %
Platelet: 222 x10 3/mm (ref 150–440)
RBC: 4.24 10*6/uL (ref 3.80–5.20)
RDW: 16.4 % — ABNORMAL HIGH (ref 11.5–14.5)
WBC: 5.5 x10 3/mm (ref 3.6–11.0)

## 2012-10-05 ENCOUNTER — Other Ambulatory Visit (HOSPITAL_COMMUNITY): Payer: Managed Care, Other (non HMO)

## 2012-10-05 ENCOUNTER — Encounter (HOSPITAL_COMMUNITY): Payer: Managed Care, Other (non HMO)

## 2012-10-11 ENCOUNTER — Ambulatory Visit: Payer: Managed Care, Other (non HMO)

## 2012-10-11 ENCOUNTER — Ambulatory Visit: Payer: Managed Care, Other (non HMO) | Admitting: Oncology

## 2012-10-11 ENCOUNTER — Other Ambulatory Visit: Payer: Managed Care, Other (non HMO) | Admitting: Lab

## 2012-10-12 ENCOUNTER — Ambulatory Visit (HOSPITAL_BASED_OUTPATIENT_CLINIC_OR_DEPARTMENT_OTHER): Payer: Managed Care, Other (non HMO)

## 2012-10-12 ENCOUNTER — Other Ambulatory Visit: Payer: Managed Care, Other (non HMO) | Admitting: Lab

## 2012-10-12 ENCOUNTER — Telehealth: Payer: Self-pay | Admitting: Oncology

## 2012-10-12 ENCOUNTER — Ambulatory Visit (HOSPITAL_BASED_OUTPATIENT_CLINIC_OR_DEPARTMENT_OTHER): Payer: Managed Care, Other (non HMO) | Admitting: Oncology

## 2012-10-12 VITALS — BP 139/91 | HR 77 | Temp 98.3°F | Resp 20 | Ht 68.0 in | Wt 176.3 lb

## 2012-10-12 DIAGNOSIS — Z5112 Encounter for antineoplastic immunotherapy: Secondary | ICD-10-CM

## 2012-10-12 DIAGNOSIS — C50419 Malignant neoplasm of upper-outer quadrant of unspecified female breast: Secondary | ICD-10-CM

## 2012-10-12 LAB — CBC WITH DIFFERENTIAL/PLATELET
BASO%: 0.3 % (ref 0.0–2.0)
Basophils Absolute: 0 10*3/uL (ref 0.0–0.1)
EOS%: 1.6 % (ref 0.0–7.0)
HCT: 36.5 % (ref 34.8–46.6)
HGB: 12.4 g/dL (ref 11.6–15.9)
MCH: 30.2 pg (ref 25.1–34.0)
MCHC: 34 g/dL (ref 31.5–36.0)
MCV: 88.6 fL (ref 79.5–101.0)
MONO%: 7.3 % (ref 0.0–14.0)
NEUT%: 75 % (ref 38.4–76.8)

## 2012-10-12 MED ORDER — TAMOXIFEN CITRATE 20 MG PO TABS: 20.0000 mg | ORAL_TABLET | Freq: Every day | ORAL | Status: DC

## 2012-10-12 MED ORDER — SODIUM CHLORIDE 0.9 % IV SOLN
Freq: Once | INTRAVENOUS | Status: AC
  Administered 2012-10-12: 13:00:00 via INTRAVENOUS

## 2012-10-12 MED ORDER — SODIUM CHLORIDE 0.9 % IV SOLN
6.0000 mg/kg | Freq: Once | INTRAVENOUS | Status: AC
  Administered 2012-10-12: 483 mg via INTRAVENOUS
  Filled 2012-10-12: qty 23

## 2012-10-12 MED ORDER — SODIUM CHLORIDE 0.9 % IJ SOLN
10.0000 mL | INTRAMUSCULAR | Status: DC | PRN
  Administered 2012-10-12: 10 mL
  Filled 2012-10-12: qty 10

## 2012-10-12 MED ORDER — ACETAMINOPHEN 325 MG PO TABS
650.0000 mg | ORAL_TABLET | Freq: Once | ORAL | Status: AC
  Administered 2012-10-12: 650 mg via ORAL

## 2012-10-12 MED ORDER — HEPARIN SOD (PORK) LOCK FLUSH 100 UNIT/ML IV SOLN
500.0000 [IU] | Freq: Once | INTRAVENOUS | Status: AC | PRN
  Administered 2012-10-12: 500 [IU]
  Filled 2012-10-12: qty 5

## 2012-10-12 NOTE — Patient Instructions (Addendum)
[facility] Discharge Instructions for Patients Receiving Chemotherapy  Today you received the following chemotherapy agents herceptin  To help prevent nausea and vomiting after your treatment, we encourage you to take your nausea medication as needed   If you develop nausea and vomiting that is not controlled by your nausea medication, call the clinic. If it is after clinic hours your family physician or the after hours number for the clinic or go to the Emergency Department.   BELOW ARE SYMPTOMS THAT SHOULD BE REPORTED IMMEDIATELY:  *FEVER GREATER THAN 100.5 F  *CHILLS WITH OR WITHOUT FEVER  NAUSEA AND VOMITING THAT IS NOT CONTROLLED WITH YOUR NAUSEA MEDICATION  *UNUSUAL SHORTNESS OF BREATH  *UNUSUAL BRUISING OR BLEEDING  TENDERNESS IN MOUTH AND THROAT WITH OR WITHOUT PRESENCE OF ULCERS  *URINARY PROBLEMS  *BOWEL PROBLEMS  UNUSUAL RASH Items with * indicate a potential emergency and should be followed up as soon as possible.  The clinic phone number is [phone].   I have been informed and understand all the instructions given to me. I know to contact the clinic, my physician, or go to the Emergency Department if any problems should occur. I do not have any questions at this time, but understand that I may call the clinic during office hours   should I have any questions or need assistance in obtaining follow up care.    __________________________________________  _____________  __________ Signature of Patient or Authorized Representative            Date                   Time    __________________________________________ Nurse's Signature

## 2012-10-12 NOTE — Progress Notes (Signed)
ID: [name]   DOB: [birth year]  MR#: [id number]  WUJ#:[id number]  PCP: Jody Dunn, CNM GYN: Jody Dunn SU: Jody Dunn OTHER MD: Jody Dunn, Jody Dunn  HISTORY OF PRESENT ILLNESS: Jody Dunn had a mammogram at [facility] of 2010 because of shooting pains in the right breast. This found no worrisome finding. More recently, in the spring of 2012, the patient at delivered her second child. She nursed but the child refused to take milk from the right breast. The patient tells me that she has been able to palpate easily from the right breast, so production is not an issue. More recently, early June 2013, the patient felt a new lump in the right breast, and brought this to the attention of Jody Dunn at [facility]. The patient had repeat mammography at [facility] 02/07/2012. This found at the breast tissue to be heterogeneously dense, which is not a surprise given that the patient is lactating. There was diffuse skin thickening. Erythema is not described. The nipple was retracted. There was no definitive peau d'orange appearance. Pleomorphic calcifications in the lateral right breast extended approximately 11 cm, without definite mass. On ultrasound there was a vague irregular hypoechoic mass measuring approximately 15 mm in the right breast, with a second ill-defined hypoechoic lobulated mass measuring approximately 17 mm. Both these masses were biopsied 02/09/2012, and the final pathology ([id number]) showed both IIb invasive ductal carcinoma, with a prognostic panel (from the mass at 11:00) as follows: estrogen receptor positive at 90%, progesterone receptor positive at 90%, Mib-1 of 30%, and HER-2 amplification with a ratio of 4.29 by CISH. MRI of the breast was performed 02/15/2012 and showed the mass in the 10:00 position to measure 3.1 cm, the one at the 11:00 position to measure 3.1 cm. There was diffuse skin thickening in the right breast and 2 enhancing abnormal-appearing lower  right axillary lymph nodes measuring 2.0 and 1.8 cm respectively. There was no evidence for internal mammary adenopathy, left axillary adenopathy, or significant findings in the left breast.  INTERVAL HISTORY: Jody Dunn returns today for followup of her right breast cancer. She is receiving adjuvant radiation under Jody Dunn, with 6 treatments remaining. She is tolerating it well, with some wet desquamation. Incidentally her car broke down on the way here today. She describes herself as "rattle", but overall "good".  REVIEW OF SYSTEMS: She is having mild fatigue from the radiation, but aside from the skin changes is tolerating it well. She has a mild sinus infection and is on a Z-Pak for that. Her hair is growing back, not as fast as she would like, but at least it [place]. A detailed review of systems today was otherwise noncontributory   PAST MEDICAL HISTORY: Past Medical History  Diagnosis Date  . Iron (Fe) deficiency anemia   . Asthma     exercise induced  . Breast cancer   . Back pain   . Migraines   . Lactose intolerance   . Hyperlipidemia     PAST SURGICAL HISTORY: Past Surgical History  Procedure Laterality Date  . Appendectomy    . Shoulder surgery    . Knee surgery      X2  . Portacath placement  02/21/2012    Procedure: INSERTION PORT-A-CATH;  Surgeon: Almond Lint, MD;  Location: [facility];  Service: General;  Laterality: N/A;  . Skin biopsy  02/21/2012    Procedure: BIOPSY SKIN;  Surgeon: Almond Lint, MD;  Location: [facility];  Service: General;  Laterality: Right;  .  Breast biopsy  02/09/2012  . Mastectomy modified radical  06/28/2012    Procedure: MASTECTOMY MODIFIED RADICAL;  Surgeon: Almond Lint, MD;  Location: [facility];  Service: General;  Laterality: Right;  Right Modified Radical Mastectomy and Left Simple Mastectomy  . Simple mastectomy with axillary sentinel node biopsy  06/28/2012    Procedure: SIMPLE MASTECTOMY;  Surgeon: Almond Lint, MD;  [facility]: [facility];  Service:  General;  Laterality: Left;    FAMILY HISTORY Family History  Problem Relation Age of Onset  . Cancer Paternal Aunt 59    Breast cancer (half sibling, related through father)  . Spina bifida Paternal Uncle 0    died around 44-96 months of age  . Cancer Maternal Grandmother 59    colon cancer  . Dementia Maternal Grandfather   . Cancer Paternal Grandmother     breast cancer between 84-42  . Cancer Paternal Grandfather     brain cancer  . Cancer Other     Paternal grandmother's siblings had breast cancer and pancreatic cancer  . Cancer Other 48    maternal great grandmother with breast cancer   the patient's parents are alive, in their early 6s. The patient is an only child. There is a significant family history of cancer including breast, brain, and colon, detailed in our genetic counselors report. BRCA and P. 53 testing is negative  GYNECOLOGIC HISTORY: Menarche age 87; she is GX P2, first live birth age 38.  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 Her husband Jody Ramon Dredge "Ed" Jody Dunn. works as a Furniture conservator/restorer for [employer]. Their children are Jody Dunn (2008) and Jody Dunn (2012).   ADVANCED DIRECTIVES: not in place  HEALTH MAINTENANCE: History  Substance Use Topics  . Smoking status: Never Smoker   . Smokeless tobacco: Never Used  . Alcohol Use: 2.5 - 3.5 oz/week    5-7 drink(s) per week     Comment: OCCASIONAL     Colonoscopy: no  PAP: 2012  Bone density: no  Lipid panel:  Allergies  Allergen Reactions  . Zofran (Ondansetron Hcl) Other (See Comments)    Migraine headaches and vomiting  . Antiseptic Products, Misc.     duraprep-rash  . Duraprep (Antiseptic Products, Misc.) Rash  . Penicillins Rash    vomiting    Current Outpatient Prescriptions  Medication Sig Dispense Refill  . doxycycline (VIBRAMYCIN) 100  MG capsule BID times 48H.      Marland Kitchen gabapentin (NEURONTIN) 300 MG capsule Take 1 capsule (300 mg total) by mouth at bedtime.  90 capsule  4  . levofloxacin (LEVAQUIN) 500 MG tablet daily.      . naproxen sodium (ANAPROX) 220 MG tablet Take 220 mg by mouth 2 (two) times daily with a meal.      . Probiotic Product (PROBIOTIC & ACIDOPHILUS EX ST) CAPS Take 1 capsule by mouth daily.       . traMADol (ULTRAM) 50 MG tablet daily.       No current facility-administered medications for this visit.    OBJECTIVE: Young white woman who appears well Filed Vitals:   10/12/12 1223  BP: 139/91  Pulse: 77  Temp: 98.3 F (36.8 C)  Resp: 20   Sclerae unicteric Oropharynx clear No cervical or supraclavicular adenopathy Lungs no rales or rhonchi Heart regular rate and rhythm Abd benign MSK no focal spinal  tenderness, no peripheral edema Neuro: nonfocal Breasts: She status post bilateral mastectomies. On the right there is no evidence of recurrence. There is some desquamation and overall erythema over the radiation port. This is most noticeable in the right axilla. The left chest wall and left axilla are unremarkable.   LAB RESULTS: Lab Results  Component Value Date   WBC 5.6 09/20/2012   NEUTROABS 3.5 09/20/2012   HGB 12.3 09/20/2012   HCT 35.4 09/20/2012   MCV 88.7 09/20/2012   PLT 231 09/20/2012       Chemistry      Component Value Date/Time   NA 140 09/20/2012 1209   NA 137 06/29/2012 0442   K 3.4* 09/20/2012 1209   K 3.9 06/29/2012 0442   CL 104 09/20/2012 1209   CL 102 06/29/2012 0442   CO2 21* 09/20/2012 1209   CO2 25 06/29/2012 0442   BUN 12.5 09/20/2012 1209   BUN 9 06/29/2012 0442   CREATININE 1.0 09/20/2012 1209   CREATININE 0.59 06/29/2012 0442      Component Value Date/Time   CALCIUM 8.6 09/20/2012 1209   CALCIUM 8.5 06/29/2012 0442   ALKPHOS 93 09/20/2012 1209   ALKPHOS 122* 04/12/2012 0913   AST 24 09/20/2012 1209   AST 19 04/12/2012 0913   ALT 22 09/20/2012 1209   ALT 15 04/12/2012  0913   BILITOT 0.21 09/20/2012 1209   BILITOT 0.2* 04/12/2012 0913       Lab Results  Component Value Date   LABCA2 28 02/16/2012     STUDIES:  Echocardiogram on 07/17/2012 shows an ejection fraction of 55%.     ASSESSMENT: 37 y.o. BRCA-negative [place] woman   (1)  status post right breast biopsy 02/09/2012 for multifocal pT4 cN1, stage IIIB invasive ductal carcinoma. Prognostic profile from one of the 2 masses shows it to be triple positive with an MIB-1 of 30%.   (2)  s/p 4 dose dense cycles of doxorubicin/ cyclophosphamide, followed by 4 cycles of paclitaxel, dose-dense,completed 06/07/2012, given with trastuzumab   (3) trastuzumab to be continued to complete a year (to July 2014). Most recent echo 07/17/2012 shows EF 55%  (4) s/p Right modified radical mastectomy (with prophylactic left simple mastectomy) 06/28/2012, showing a residual 2.3 cm invasive ductal carcinoma, grade 3, with 0 of 14 lymph nodes involved.  (5)  Receiving radiation in [place], beginning January 2014.  (6)  will be starting on tamoxifen 11/21/2012  PLAN:  Asher Muir is considering bilateral salpingo-oophorectomy plus or minus hysterectomy. We discussed the pros and cons of neurosurgery, ovaries only, or total abdominal hysterectomy with bilateral salpingo-oophorectomy. In patients who can tinea to have menstrual periods after chemotherapy there is an advantage to bilateral oophorectomy, but this advantage seems to disappear when anti-estrogens like tamoxifen are added. Asher Muir does understand that once her ovaries are removed she will face the usual accompaniment to menopause including disturbed sleep, mood swings, weight gain, bone density loss, vaginal dryness, and hot flashes. She is weighing all this and is planning to meet with her gynecologist in the next few weeks to make a definitive decision.  Today we also discussed tamoxifen, and particularly it's possible toxicities, side effects and  complications, as well as its possible benefits. She will see [place] again in 3 months, by which time she will have been on tamoxifen long enough to know she is going to have significant side effects from it. We'Dunn also obtaining a bone density in the near future in case we decide to  start on aromatase inhibitor instead. Otherwise the plan is to continue trastuzumab and she is a ready scheduled for repeat echocardiogram later this month. She knows to call for any problems that may develop before the next visit.  Azarria Balint C    10/12/2012

## 2012-10-19 ENCOUNTER — Ambulatory Visit (INDEPENDENT_AMBULATORY_CARE_PROVIDER_SITE_OTHER): Payer: Managed Care, Other (non HMO) | Admitting: General Surgery

## 2012-10-19 ENCOUNTER — Encounter (INDEPENDENT_AMBULATORY_CARE_PROVIDER_SITE_OTHER): Payer: Self-pay | Admitting: General Surgery

## 2012-10-19 VITALS — BP 130/68 | HR 84 | Temp 96.0°F | Resp 18 | Ht 68.0 in | Wt 176.0 lb

## 2012-10-19 NOTE — Progress Notes (Signed)
HISTORY: Patient is now almost 4 months status post bilateral mastectomy with right axillary lymph node dissection.  She did have a postop seroma that required drainage in the clinic.  She is finishing up radiation.  She is getting ready to start antiestrogen treatment and is discussing methods of hysterectomy/oophorectomy with her midwife/gyn practice.  She is starting to get some feeling back in the superior right flap.     PERTINENT REVIEW OF SYSTEMS: Otherwise negative.     EXAM: Head: Normocephalic and atraumatic.  Eyes:  Conjunctivae are normal. Pupils are equal, round, and reactive to light. No scleral icterus.  Neck:  Normal range of motion. Neck supple. No tracheal deviation present. No thyromegaly present.  Resp: No respiratory distress, normal effort. Chest wall:  No masses.  Some desquamation on right.   Abd:  Abdomen is soft, non distended and non tender. No masses are palpable.  There is no rebound and no guarding.  Neurological: Alert and oriented to person, place, and time. Coordination normal.  Skin: Skin is warm and dry. No rash noted. No diaphoretic. No erythema. No pallor.  Psychiatric: Normal mood and affect. Normal behavior. Judgment and thought content normal.      ASSESSMENT AND PLAN:   Cancer of upper-outer quadrant of female breast, right breast.  [id number] No clinical evidence of disease. The patient is finishing up radiation tomorrow.  She still has 6 months of Herceptin left. Follow up with me prior to reconstruction in 6 months.        Maudry Diego, MD Surgical Oncology, General & Endocrine Surgery [facility]  [name], CNM [name], CNM

## 2012-10-19 NOTE — Assessment & Plan Note (Signed)
No clinical evidence of disease. The patient is finishing up radiation tomorrow.  She still has 6 months of Herceptin left. Follow up with me prior to reconstruction in 6 months.

## 2012-10-19 NOTE — Patient Instructions (Signed)
Follow up with me in 6 months just prior to reconstruction.

## 2012-10-21 ENCOUNTER — Ambulatory Visit: Payer: Self-pay | Admitting: Radiation Oncology

## 2012-10-30 ENCOUNTER — Ambulatory Visit (HOSPITAL_COMMUNITY)
Admission: RE | Admit: 2012-10-30 | Discharge: 2012-10-30 | Disposition: A | Discharge status: Home / Self Care | Payer: Managed Care, Other (non HMO) | Source: Ambulatory Visit | Attending: Internal Medicine | Admitting: Internal Medicine

## 2012-10-30 VITALS — BP 116/68 | HR 80 | Wt 177.5 lb

## 2012-10-30 DIAGNOSIS — C50419 Malignant neoplasm of upper-outer quadrant of unspecified female breast: Secondary | ICD-10-CM | POA: Insufficient documentation

## 2012-10-30 DIAGNOSIS — D4989 Neoplasm of unspecified behavior of other specified sites: Secondary | ICD-10-CM

## 2012-10-30 NOTE — Assessment & Plan Note (Addendum)
Have reviewed today's echo.  EF and lateral s' remain stable.  Cleared to continue herceptin q3 weeks with repeat echo in 3 months.   Patient seen and examined with Ulyess Blossom, PA-C. We discussed all aspects of the encounter. I agree with the assessment and plan as stated above. I reviewed echos personally. EF and Doppler parameters stable. No HF on exam. Continue Herceptin.

## 2012-10-30 NOTE — Progress Notes (Signed)
HPI: Referring Physician: Dr. 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 Darnelle Catalan for enrollment into the cardio-oncology clinic.  In June 2013, the patient felt a new lump in the right breast, and brought this to the attention of Jody Dunn at [facility]. Found to have breast masses. Both these masses were biopsied 02/09/2012, and the final pathology ([id number]) showed both IIb invasive ductal carcinoma, with a prognostic panel (from the mass at 11:00) as follows: estrogen receptor positive at 90%, progesterone receptor positive at 90%, Mib-1 of 30%, and HER-2 amplification with a ratio of 4.29 by CISH. MRI of the breast was performed 02/15/2012 and showed the mass in the 10:00 position to measure 3.1 cm, the one at the 11:00 position to measure 3.1 cm. There was diffuse skin thickening in the right breast and 2 enhancing abnormal-appearing lower right axillary lymph nodes measuring 2.0 and 1.8 cm respectively. There was no evidence for internal mammary adenopathy, left axillary adenopathy, or significant findings in the left breast. Both lymph nodes and skin bx + for CA so staged at IIIb.   Echo: 02/23/12 reviewed in clinic EF 60% 06/2012 EF 55% lat s' 10.8 10/30/12: EF 55-60% lat s' 11  Started chemo July 10 with dose dense cycles of doxorubicin/cyclophosphamide being given in the neoadjuvant setting. S/p bilateral mastectomies and XRT.  Started on herceptin.  She returns for 3 month follow up.  She feels well.  She denies orthopnea, PND or edema.  She continues on herceptin q 3 weeks until September 2014.  ROS: All systems negative except as listed in HPI, PMH and Problem List.  Past Medical History  Diagnosis Date  . Iron (Fe) deficiency anemia   . Asthma     exercise induced  . Breast cancer   . Back pain   .  Migraines   . Lactose intolerance   . Hyperlipidemia     Current Outpatient Prescriptions  Medication Sig Dispense Refill  . naproxen sodium (ANAPROX) 220 MG tablet Take 220 mg by mouth 2 (two) times daily with a meal.      . Probiotic Product (PROBIOTIC & ACIDOPHILUS EX ST) CAPS Take 1 capsule by mouth daily.       . tamoxifen (NOLVADEX) 20 MG tablet Take 1 tablet (20 mg total) by mouth daily.  90 tablet  6   No current facility-administered medications for this encounter.     PHYSICAL EXAM: Filed Vitals:   10/30/12 1102  BP: 116/68  Pulse: 80  Weight: 177 lb 8 oz (80.513 kg)  SpO2: 100%    General:  Well appearing. No resp difficulty HEENT: normal Neck: supple. JVP flat. Carotids 2+ bilaterally; no bruits. No lymphadenopathy or thryomegaly appreciated. Cor: PMI normal. Regular rate & rhythm. No rubs, gallops or murmurs. Lungs: clear Abdomen: soft, nontender, nondistended. No hepatosplenomegaly. No bruits or masses. Good bowel sounds. Extremities: no cyanosis, clubbing, rash, edema Neuro: alert & orientedx3, cranial nerves grossly intact. Moves all 4 extremities w/o difficulty. Affect pleasant.   ASSESSMENT & PLAN:

## 2012-10-30 NOTE — Patient Instructions (Addendum)
We will contact you in 3 months to schedule your next appointment and echocardiogram  

## 2012-10-30 NOTE — Progress Notes (Signed)
  Echocardiogram 2D Echocardiogram has been performed.  MORFORD, MELISSA 10/30/2012, 11:07 AM

## 2012-11-01 ENCOUNTER — Encounter: Payer: Self-pay | Admitting: Physician Assistant

## 2012-11-01 ENCOUNTER — Other Ambulatory Visit (HOSPITAL_BASED_OUTPATIENT_CLINIC_OR_DEPARTMENT_OTHER): Payer: Managed Care, Other (non HMO) | Admitting: Lab

## 2012-11-01 ENCOUNTER — Telehealth: Payer: Self-pay | Admitting: Oncology

## 2012-11-01 ENCOUNTER — Ambulatory Visit (HOSPITAL_BASED_OUTPATIENT_CLINIC_OR_DEPARTMENT_OTHER): Payer: Managed Care, Other (non HMO)

## 2012-11-01 ENCOUNTER — Ambulatory Visit (HOSPITAL_BASED_OUTPATIENT_CLINIC_OR_DEPARTMENT_OTHER): Payer: Managed Care, Other (non HMO) | Admitting: Physician Assistant

## 2012-11-01 VITALS — BP 120/80 | HR 86 | Temp 98.2°F | Resp 20 | Ht 68.0 in | Wt 176.4 lb

## 2012-11-01 DIAGNOSIS — C50419 Malignant neoplasm of upper-outer quadrant of unspecified female breast: Secondary | ICD-10-CM

## 2012-11-01 DIAGNOSIS — Z5112 Encounter for antineoplastic immunotherapy: Secondary | ICD-10-CM

## 2012-11-01 LAB — CBC WITH DIFFERENTIAL/PLATELET
Basophils Absolute: 0 10*3/uL (ref 0.0–0.1)
Eosinophils Absolute: 0.1 10*3/uL (ref 0.0–0.5)
HGB: 12.2 g/dL (ref 11.6–15.9)
LYMPH%: 17.8 % (ref 14.0–49.7)
MCV: 89.9 fL (ref 79.5–101.0)
MONO#: 0.4 10*3/uL (ref 0.1–0.9)
MONO%: 7.3 % (ref 0.0–14.0)
NEUT#: 4.2 10*3/uL (ref 1.5–6.5)
Platelets: 267 10*3/uL (ref 145–400)
RBC: 3.89 10*6/uL (ref 3.70–5.45)
WBC: 5.8 10*3/uL (ref 3.9–10.3)

## 2012-11-01 MED ORDER — SODIUM CHLORIDE 0.9 % IV SOLN
Freq: Once | INTRAVENOUS | Status: AC
  Administered 2012-11-01: 10:00:00 via INTRAVENOUS

## 2012-11-01 MED ORDER — HEPARIN SOD (PORK) LOCK FLUSH 100 UNIT/ML IV SOLN
500.0000 [IU] | Freq: Once | INTRAVENOUS | Status: AC | PRN
  Administered 2012-11-01: 500 [IU]
  Filled 2012-11-01: qty 5

## 2012-11-01 MED ORDER — SODIUM CHLORIDE 0.9 % IJ SOLN
10.0000 mL | INTRAMUSCULAR | Status: DC | PRN
  Administered 2012-11-01: 10 mL
  Filled 2012-11-01: qty 10

## 2012-11-01 MED ORDER — ACETAMINOPHEN 325 MG PO TABS
650.0000 mg | ORAL_TABLET | Freq: Once | ORAL | Status: AC
  Administered 2012-11-01: 650 mg via ORAL

## 2012-11-01 MED ORDER — TRASTUZUMAB CHEMO INJECTION 440 MG
6.0000 mg/kg | Freq: Once | INTRAVENOUS | Status: AC
  Administered 2012-11-01: 483 mg via INTRAVENOUS
  Filled 2012-11-01: qty 23

## 2012-11-01 NOTE — Patient Instructions (Signed)
[facility] Discharge Instructions for Patients Receiving Chemotherapy  Today you received the following chemotherapy agents Herceptin.  To help prevent nausea and vomiting after your treatment, we encourage you to take your nausea medication as ordered per MD.    If you develop nausea and vomiting that is not controlled by your nausea medication, call the clinic. If it is after clinic hours your family physician or the after hours number for the clinic or go to the Emergency Department.   BELOW ARE SYMPTOMS THAT SHOULD BE REPORTED IMMEDIATELY:  *FEVER GREATER THAN 100.5 F  *CHILLS WITH OR WITHOUT FEVER  NAUSEA AND VOMITING THAT IS NOT CONTROLLED WITH YOUR NAUSEA MEDICATION  *UNUSUAL SHORTNESS OF BREATH  *UNUSUAL BRUISING OR BLEEDING  TENDERNESS IN MOUTH AND THROAT WITH OR WITHOUT PRESENCE OF ULCERS  *URINARY PROBLEMS  *BOWEL PROBLEMS  UNUSUAL RASH Items with * indicate a potential emergency and should be followed up as soon as possible.  . Please let the nurse know about any problems that you may have experienced. Feel free to call the clinic you have any questions or concerns. The clinic phone number is [phone].   I have been informed and understand all the instructions given to me. I know to contact the clinic, my physician, or go to the Emergency Department if any problems should occur. I do not have any questions at this time, but understand that I may call the clinic during office hours   should I have any questions or need assistance in obtaining follow up care.    __________________________________________  _____________  __________ Signature of Patient or Authorized Representative            Date                   Time    __________________________________________ Nurse's Signature    

## 2012-11-01 NOTE — Progress Notes (Signed)
ID: Jody Dunn   DOB: 1976/05/21  MR#: [id number]  WUJ#:[id number]  PCP: Jody Dunn, CNM GYN: Jody Dunn SU: Jody Dunn OTHER MD: Jody Dunn, Jody Dunn  HISTORY OF PRESENT ILLNESS: Jody Dunn had a mammogram at [facility] of 2010 because of shooting pains in the right breast. This found no worrisome finding. More recently, in the spring of 2012, the patient at delivered her second child. She nursed but the child refused to take milk from the right breast. The patient tells me that she has been able to palpate easily from the right breast, so production is not an issue. More recently, early June 2013, the patient felt a new lump in the right breast, and brought this to the attention of Jody Dunn at [facility]. The patient had repeat mammography at [facility] 02/07/2012. This found at the breast tissue to be heterogeneously dense, which is not a surprise given that the patient is lactating. There was diffuse skin thickening. Erythema is not described. The nipple was retracted. There was no definitive peau d'orange appearance. Pleomorphic calcifications in the lateral right breast extended approximately 11 cm, without definite mass. On ultrasound there was a vague irregular hypoechoic mass measuring approximately 15 mm in the right breast, with a second ill-defined hypoechoic lobulated mass measuring approximately 17 mm. Both these masses were biopsied 02/09/2012, and the final pathology ([id number]) showed both IIb invasive ductal carcinoma, with a prognostic panel (from the mass at 11:00) as follows: estrogen receptor positive at 90%, progesterone receptor positive at 90%, Mib-1 of 30%, and HER-2 amplification with a ratio of 4.29 by CISH. MRI of the breast was performed 02/15/2012 and showed the mass in the 10:00 position to measure 3.1 cm, the one at the 11:00 position to measure 3.1 cm. There was diffuse skin thickening in the right breast and 2 enhancing abnormal-appearing lower  right axillary lymph nodes measuring 2.0 and 1.8 cm respectively. There was no evidence for internal mammary adenopathy, left axillary adenopathy, or significant findings in the left breast.  INTERVAL HISTORY: Jody Dunn returns today for followup of her right breast cancer. She completed radiation therapy in [place] in late February, and continues to receive trastuzumab every 3 weeks. She is due for her next infusion of trastuzumab today.  Overall, Jody Dunn tells me she is "feeling good". Her strength is returning, as is her energy level. She's had very little skin change associated with the radiation therapy. She recently had a repeat echocardiogram which showed a well preserved ejection fraction of 55-60%. She is ready to initiate her tamoxifen in the next few days.  Jody Dunn is still discussing with her gynecologist, Dr. Billy Coast, the possibility of TAH/BSO, and in fact has an appointment to followup and discuss her options with him tomorrow.   REVIEW OF SYSTEMS: Sophy has had no recent illnesses and denies fevers or chills. She has only occasional very mild hot flashes, nothing that is problematic to her. She's eating and drinking well and has no nausea or change in bowel or bladder habits. She denies any cough, shortness of breath, or chest pain. She's had no abnormal headaches or dizziness. She denies any unusual myalgias, arthralgias, bony pain, or peripheral swelling. She also denies any signs of peripheral neuropathy.  A detailed review of systems is otherwise stable and noncontributory.  PAST MEDICAL HISTORY: Past Medical History  Diagnosis Date  . Iron (Fe) deficiency anemia   . Asthma     exercise induced  . Breast cancer   . Back  pain   . Migraines   . Lactose intolerance   . Hyperlipidemia     PAST SURGICAL HISTORY: Past Surgical History  Procedure Laterality Date  . Appendectomy    . Shoulder surgery    . Knee surgery      X2  . Portacath placement  02/21/2012    Procedure:  INSERTION PORT-A-CATH;  Surgeon: Almond Lint, MD;  Location: [facility];  Service: General;  Laterality: N/A;  . Skin biopsy  02/21/2012    Procedure: BIOPSY SKIN;  Surgeon: Almond Lint, MD;  Location: [facility];  Service: General;  Laterality: Right;  . Breast biopsy  02/09/2012  . Mastectomy modified radical  06/28/2012    Procedure: MASTECTOMY MODIFIED RADICAL;  Surgeon: Almond Lint, MD;  Location: [facility];  Service: General;  Laterality: Right;  Right Modified Radical Mastectomy and Left Simple Mastectomy  . Simple mastectomy with axillary sentinel node biopsy  06/28/2012    Procedure: SIMPLE MASTECTOMY;  Surgeon: Almond Lint, MD;  Location: [facility];  Service: General;  Laterality: Left;    FAMILY HISTORY Family History  Problem Relation Age of Onset  . Cancer Paternal Aunt 2    Breast cancer (half sibling, related through father)  . Spina bifida Paternal Uncle 0    died around 51-44 months of age  . Cancer Maternal Grandmother 56    colon cancer  . Dementia Maternal Grandfather   . Cancer Paternal Grandmother     breast cancer between 65-42  . Cancer Paternal Grandfather     brain cancer  . Cancer Other     Paternal grandmother's siblings had breast cancer and pancreatic cancer  . Cancer Other 70    maternal great grandmother with breast cancer   the patient's parents are alive, in their early 60s. The patient is an only child. There is a significant family history of cancer including breast, brain, and colon, detailed in our genetic counselors report. BRCA and P. 53 testing is negative  GYNECOLOGIC HISTORY: Menarche age 38; she is GX P2, first live birth age 38.  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 Her husband Jody Dunn. works as a Furniture conservator/restorer for [employer]. Their children are Jody Dunn (2008) and Jody Dunn  (2012).   ADVANCED DIRECTIVES: not in place  HEALTH MAINTENANCE: History  Substance Use Topics  . Smoking status: Never Smoker   . Smokeless tobacco: Never Used  . Alcohol Use: 2.5 - 3.5 oz/week    5-7 drink(s) per week     Comment: OCCASIONAL     Colonoscopy: no  PAP: 2012  Bone density: no  Lipid panel:  Allergies  Allergen Reactions  . Zofran (Ondansetron Hcl) Other (See Comments)    Migraine headaches and vomiting  . Antiseptic Products, Misc.     duraprep-rash  . Duraprep (Antiseptic Products, Misc.) Rash  . Penicillins Rash    vomiting    Current Outpatient Prescriptions  Medication Sig Dispense Refill  . naproxen sodium (ANAPROX) 220 MG tablet Take 220 mg by mouth 2 (two) times daily with a meal.      . Probiotic Product (PROBIOTIC & ACIDOPHILUS EX ST) CAPS Take 1 capsule by mouth daily.       . tamoxifen (NOLVADEX) 20 MG tablet Take 1 tablet (20 mg total) by mouth daily.  90 tablet  6  No current facility-administered medications for this visit.    OBJECTIVE: Young white woman who appears well Filed Vitals:   11/01/12 0903  BP: 120/80  Pulse: 86  Temp: 98.2 F (36.8 C)  Resp: 20   Sclerae unicteric Oropharynx clear No cervical or supraclavicular adenopathy Lungs no rales or rhonchi Heart regular rate and rhythm Abdomen is soft, nontender, with positive bowel sounds MSK no focal spinal tenderness, no peripheral edema Neuro: nonfocal, well oriented with positive affect Breasts: She status post bilateral mastectomies. On the right there is no evidence of recurrence. There is some hyperpigmentation and dry desquamation status post radiation therapy.  The left chest wall is unremarkable.  Axillae are benign bilaterally the palpable adenopathy.   LAB RESULTS: Lab Results  Component Value Date   WBC 5.8 11/01/2012   NEUTROABS 4.2 11/01/2012   HGB 12.2 11/01/2012   HCT 35.0 11/01/2012   MCV 89.9 11/01/2012   PLT 267 11/01/2012       Chemistry       Component Value Date/Time   NA 140 09/20/2012 1209   NA 137 06/29/2012 0442   K 3.4* 09/20/2012 1209   K 3.9 06/29/2012 0442   CL 104 09/20/2012 1209   CL 102 06/29/2012 0442   CO2 21* 09/20/2012 1209   CO2 25 06/29/2012 0442   BUN 12.5 09/20/2012 1209   BUN 9 06/29/2012 0442   CREATININE 1.0 09/20/2012 1209   CREATININE 0.59 06/29/2012 0442      Component Value Date/Time   CALCIUM 8.6 09/20/2012 1209   CALCIUM 8.5 06/29/2012 0442   ALKPHOS 93 09/20/2012 1209   ALKPHOS 122* 04/12/2012 0913   AST 24 09/20/2012 1209   AST 19 04/12/2012 0913   ALT 22 09/20/2012 1209   ALT 15 04/12/2012 0913   BILITOT 0.21 09/20/2012 1209   BILITOT 0.2* 04/12/2012 0913       Lab Results  Component Value Date   LABCA2 28 02/16/2012     STUDIES:  Echocardiogram on 10/30/2012 shows an ejection fraction of 55 - 60 %.   ASSESSMENT: 37 y.o. BRCA-negative [place] woman   (1)  status post right breast biopsy 02/09/2012 for multifocal pT4 cN1, stage IIIB invasive ductal carcinoma. Prognostic profile from one of the 2 masses shows it to be triple positive with an MIB-1 of 30%.   (2)  s/p 4 dose dense cycles of doxorubicin/ cyclophosphamide, followed by 4 cycles of paclitaxel, dose-dense,completed 06/07/2012, given with trastuzumab   (3) trastuzumab to be continued to complete a year (to July 2014). Most recent echo 07/17/2012 shows EF 55%  (4) s/p Right modified radical mastectomy (with prophylactic left simple mastectomy) 06/28/2012, showing a residual 2.3 cm invasive ductal carcinoma, grade 3, with 0 of 14 lymph nodes involved.  (5)  Received radiation in [place] under the care of Dr. Genelle Dunn, beginning January 2014 and completed in late February 2014  (6)  will be starting on tamoxifen 11/04/2012  PLAN:  Jody Dunn will proceed to treatment today as scheduled for her next q. three-week dose of trastuzumab and we will continue treating on a Q. three-week basis. She is getting ready to begin her tamoxifen  this coming week and on March 15. I will see her in 2 months, may 15, to assess her tolerance of the drug.  At this time, Jody Dunn is still seriously considering a total abdominal hysterectomy with bilateral salpingo-oophorectomy. She is not interested at this time in pursuing an aromatase inhibitor even after the surgery unless  she is having very poor tolerance of the tamoxifen. Accordingly, at this time she declines a bone density, but will consider if she is not tolerating the tamoxifen well and needs to explore other options.  All of this was reviewed in detail with Jody Dunn today. She voices her understanding and agreement with our plan, and has a clear understanding of possible side effects associated with the tamoxifen. She will call any changes or problems.  Her next echocardiogram will be due in June, and I will schedule this for her when I see her in May.    BERRY,AMY    11/01/2012

## 2012-11-01 NOTE — Telephone Encounter (Signed)
gv pt appt schedule for March thru May. °

## 2012-11-03 ENCOUNTER — Other Ambulatory Visit: Payer: Self-pay | Admitting: Certified Registered Nurse Anesthetist

## 2012-11-07 ENCOUNTER — Encounter: Payer: Self-pay | Admitting: Oncology

## 2012-11-07 ENCOUNTER — Other Ambulatory Visit: Payer: Self-pay | Admitting: Obstetrics and Gynecology

## 2012-11-07 ENCOUNTER — Encounter (HOSPITAL_COMMUNITY): Payer: Self-pay | Admitting: Pharmacist

## 2012-11-09 ENCOUNTER — Other Ambulatory Visit: Payer: Self-pay | Admitting: Oncology

## 2012-11-10 ENCOUNTER — Encounter (HOSPITAL_COMMUNITY)
Admission: RE | Admit: 2012-11-10 | Discharge: 2012-11-10 | Disposition: A | Discharge status: Home / Self Care | Payer: Managed Care, Other (non HMO) | Source: Ambulatory Visit | Attending: Obstetrics and Gynecology | Admitting: Obstetrics and Gynecology

## 2012-11-10 ENCOUNTER — Encounter (HOSPITAL_COMMUNITY): Payer: Self-pay

## 2012-11-10 ENCOUNTER — Inpatient Hospital Stay (HOSPITAL_COMMUNITY): Admission: RE | Admit: 2012-11-10 | Payer: Managed Care, Other (non HMO) | Source: Ambulatory Visit

## 2012-11-10 LAB — CBC
HCT: 37 % (ref 36.0–46.0)
Hemoglobin: 12.6 g/dL (ref 12.0–15.0)
MCHC: 34.1 g/dL (ref 30.0–36.0)
RBC: 4.09 MIL/uL (ref 3.87–5.11)
WBC: 10.3 10*3/uL (ref 4.0–10.5)

## 2012-11-10 LAB — COMPREHENSIVE METABOLIC PANEL
ALT: 18 U/L (ref 0–35)
Alkaline Phosphatase: 94 U/L (ref 39–117)
BUN: 15 mg/dL (ref 6–23)
CO2: 26 mEq/L (ref 19–32)
Chloride: 97 mEq/L (ref 96–112)
GFR calc Af Amer: 90 mL/min — ABNORMAL LOW (ref >90)
GFR calc non Af Amer: 77 mL/min — ABNORMAL LOW (ref >90)
Glucose, Bld: 105 mg/dL — ABNORMAL HIGH (ref 70–99)
Potassium: 3.7 mEq/L (ref 3.5–5.1)
Sodium: 137 mEq/L (ref 135–145)
Total Bilirubin: 0.3 mg/dL (ref 0.3–1.2)

## 2012-11-10 LAB — SURGICAL PCR SCREEN: MRSA, PCR: NEGATIVE

## 2012-11-10 NOTE — Patient Instructions (Addendum)
20 Jody Dunn  11/10/2012   Your procedure is scheduled on:  11/13/12  Enter through the Main Entrance of [facility] at 6 AM.  Pick up the phone at the desk and dial [phone].   Call this number if you have problems the morning of surgery: [phone]   Remember:   Do not eat food:After Midnight.  Do not drink clear liquids: After Midnight.  Take these medicines the morning of surgery with A SIP OF WATER: NA   Do not wear jewelry, make-up or nail polish.  Do not wear lotions, powders, or perfumes. You may wear deodorant.  Do not shave 48 hours prior to surgery.  Do not bring valuables to the hospital.  Contacts, dentures or bridgework may not be worn into surgery.  Leave suitcase in the car. After surgery it may be brought to your room.  For patients admitted to the hospital, checkout time is 11:00 AM the day of discharge.   Patients discharged the day of surgery will not be allowed to drive home.  Name and phone number of your driver: NA  Special Instructions: Shower using CHG 2 nights before surgery and the night before surgery.  If you shower the day of surgery use CHG.  Use special wash - you have one bottle of CHG for all showers.  You should use approximately 1/3 of the bottle for each shower.   Please read over the following fact sheets that you were given: MRSA Information

## 2012-11-12 ENCOUNTER — Encounter (HOSPITAL_COMMUNITY): Payer: Self-pay | Admitting: Anesthesiology

## 2012-11-12 NOTE — Anesthesia Preprocedure Evaluation (Addendum)
Anesthesia Evaluation  Patient identified by MRN, date of birth, ID band Patient awake    Reviewed: Allergy & Precautions, H&P , NPO status , Patient's Chart, lab work & pertinent test results  Airway Mallampati: III TM Distance: >3 FB Neck ROM: Full    Dental no notable dental hx. (+) Teeth Intact   Pulmonary asthma ,  breath sounds clear to auscultation  Pulmonary exam normal       Cardiovascular negative cardio ROS  Rhythm:Regular Rate:Normal     Neuro/Psych  Headaches, negative psych ROS   GI/Hepatic Neg liver ROS, Lactose intolerance   Endo/Other  Hyperlipidemia Hx/o Breast CA S/P Chemo & RT  Renal/GU negative Renal ROS  negative genitourinary   Musculoskeletal   Abdominal Normal abdominal exam  (+)   Peds  Hematology  (+) Blood dyscrasia, anemia ,   Anesthesia Other Findings Narrow mouth  Reproductive/Obstetrics negative OB ROS                         Anesthesia Physical Anesthesia Plan  ASA: II  Anesthesia Plan: General   Post-op Pain Management:    Induction: Intravenous  Airway Management Planned: Oral ETT and Video Laryngoscope Planned  Additional Equipment:   Intra-op Plan:   Post-operative Plan: Extubation in OR  Informed Consent: I have reviewed the patients History and Physical, chart, labs and discussed the procedure including the risks, benefits and alternatives for the proposed anesthesia with the patient or authorized representative who has indicated his/her understanding and acceptance.   Dental advisory given  Plan Discussed with: CRNA, Anesthesiologist and Surgeon  Anesthesia Plan Comments:        Anesthesia Quick Evaluation

## 2012-11-13 ENCOUNTER — Encounter (HOSPITAL_COMMUNITY): Payer: Self-pay | Admitting: *Deleted

## 2012-11-13 ENCOUNTER — Ambulatory Visit (HOSPITAL_COMMUNITY): Payer: Managed Care, Other (non HMO) | Admitting: Anesthesiology

## 2012-11-13 ENCOUNTER — Encounter (HOSPITAL_COMMUNITY)
Admission: RE | Disposition: A | Discharge status: Home / Self Care | Payer: Self-pay | Source: Ambulatory Visit | Attending: Obstetrics and Gynecology

## 2012-11-13 ENCOUNTER — Encounter (HOSPITAL_COMMUNITY): Payer: Self-pay | Admitting: Anesthesiology

## 2012-11-13 ENCOUNTER — Encounter: Payer: Self-pay | Admitting: Oncology

## 2012-11-13 ENCOUNTER — Ambulatory Visit (HOSPITAL_COMMUNITY)
Admission: RE | Admit: 2012-11-13 | Discharge: 2012-11-13 | Disposition: A | Discharge status: Home / Self Care | Payer: Managed Care, Other (non HMO) | Source: Ambulatory Visit | Attending: Obstetrics and Gynecology | Admitting: Obstetrics and Gynecology

## 2012-11-13 DIAGNOSIS — Z170 Estrogen receptor positive status [ER+]: Secondary | ICD-10-CM | POA: Insufficient documentation

## 2012-11-13 DIAGNOSIS — Z4009 Encounter for prophylactic removal of other organ: Secondary | ICD-10-CM | POA: Insufficient documentation

## 2012-11-13 DIAGNOSIS — N8300 Follicular cyst of ovary, unspecified side: Secondary | ICD-10-CM | POA: Insufficient documentation

## 2012-11-13 DIAGNOSIS — N83209 Unspecified ovarian cyst, unspecified side: Secondary | ICD-10-CM | POA: Insufficient documentation

## 2012-11-13 DIAGNOSIS — N815 Vaginal enterocele: Secondary | ICD-10-CM | POA: Insufficient documentation

## 2012-11-13 DIAGNOSIS — Z803 Family history of malignant neoplasm of breast: Secondary | ICD-10-CM | POA: Insufficient documentation

## 2012-11-13 DIAGNOSIS — Z90710 Acquired absence of both cervix and uterus: Secondary | ICD-10-CM | POA: Diagnosis not present

## 2012-11-13 DIAGNOSIS — C50919 Malignant neoplasm of unspecified site of unspecified female breast: Secondary | ICD-10-CM | POA: Insufficient documentation

## 2012-11-13 HISTORY — PX: ROBOTIC ASSISTED TOTAL HYSTERECTOMY WITH BILATERAL SALPINGO OOPHERECTOMY: SHX6086

## 2012-11-13 SURGERY — ROBOTIC ASSISTED TOTAL HYSTERECTOMY WITH BILATERAL SALPINGO OOPHORECTOMY
Anesthesia: General | Site: Abdomen | Wound class: Clean Contaminated

## 2012-11-13 MED ORDER — DIPHENHYDRAMINE HCL 50 MG/ML IJ SOLN: 12.5000 mg | Freq: Four times a day (QID) | INTRAMUSCULAR | Status: DC | PRN

## 2012-11-13 MED ORDER — BUPIVACAINE HCL (PF) 0.25 % IJ SOLN
INTRAMUSCULAR | Status: AC
  Filled 2012-11-13: qty 30

## 2012-11-13 MED ORDER — ROPIVACAINE HCL 5 MG/ML IJ SOLN
INTRAMUSCULAR | Status: AC
  Filled 2012-11-13: qty 60

## 2012-11-13 MED ORDER — METOCLOPRAMIDE HCL 5 MG/ML IJ SOLN: 10.0000 mg | Freq: Once | INTRAMUSCULAR | Status: DC | PRN

## 2012-11-13 MED ORDER — HYDROMORPHONE 0.3 MG/ML IV SOLN
INTRAVENOUS | Status: DC
  Administered 2012-11-13: 0.799 mg via INTRAVENOUS
  Administered 2012-11-13: 11:00:00 via INTRAVENOUS
  Filled 2012-11-13: qty 25

## 2012-11-13 MED ORDER — MIDAZOLAM HCL 2 MG/2ML IJ SOLN
INTRAMUSCULAR | Status: AC
  Filled 2012-11-13: qty 2

## 2012-11-13 MED ORDER — MEPERIDINE HCL 25 MG/ML IJ SOLN: 6.2500 mg | INTRAMUSCULAR | Status: DC | PRN

## 2012-11-13 MED ORDER — DEXTROSE IN LACTATED RINGERS 5 % IV SOLN: INTRAVENOUS | Status: DC

## 2012-11-13 MED ORDER — HYDROMORPHONE HCL PF 1 MG/ML IJ SOLN
INTRAMUSCULAR | Status: AC
  Filled 2012-11-13: qty 1

## 2012-11-13 MED ORDER — LIDOCAINE HCL (CARDIAC) 20 MG/ML IV SOLN
INTRAVENOUS | Status: DC | PRN
  Administered 2012-11-13: 100 mg via INTRAVENOUS

## 2012-11-13 MED ORDER — PHENYLEPHRINE 40 MCG/ML (10ML) SYRINGE FOR IV PUSH (FOR BLOOD PRESSURE SUPPORT)
PREFILLED_SYRINGE | INTRAVENOUS | Status: AC
  Filled 2012-11-13: qty 5

## 2012-11-13 MED ORDER — DIPHENHYDRAMINE HCL 12.5 MG/5ML PO ELIX: 12.5000 mg | ORAL_SOLUTION | Freq: Four times a day (QID) | ORAL | Status: DC | PRN

## 2012-11-13 MED ORDER — BUPIVACAINE HCL (PF) 0.25 % IJ SOLN
INTRAMUSCULAR | Status: DC | PRN
  Administered 2012-11-13: 12 mL

## 2012-11-13 MED ORDER — OXYCODONE-ACETAMINOPHEN 5-325 MG PO TABS: 1.0000 | ORAL_TABLET | ORAL | Status: DC | PRN

## 2012-11-13 MED ORDER — NEOSTIGMINE METHYLSULFATE 1 MG/ML IJ SOLN
INTRAMUSCULAR | Status: AC
  Filled 2012-11-13: qty 1

## 2012-11-13 MED ORDER — EPHEDRINE 5 MG/ML INJ
INTRAVENOUS | Status: AC
  Filled 2012-11-13: qty 10

## 2012-11-13 MED ORDER — ZOLPIDEM TARTRATE 5 MG PO TABS: 5.0000 mg | ORAL_TABLET | Freq: Every evening | ORAL | Status: DC | PRN

## 2012-11-13 MED ORDER — HYDROMORPHONE HCL PF 1 MG/ML IJ SOLN
INTRAMUSCULAR | Status: DC | PRN
  Administered 2012-11-13 (×2): .25 mg via INTRAVENOUS

## 2012-11-13 MED ORDER — GLYCOPYRROLATE 0.2 MG/ML IJ SOLN
INTRAMUSCULAR | Status: AC
  Filled 2012-11-13: qty 3

## 2012-11-13 MED ORDER — FENTANYL CITRATE 0.05 MG/ML IJ SOLN
INTRAMUSCULAR | Status: AC
Start: 2012-11-13 — End: 2012-11-13
  Filled 2012-11-13: qty 5

## 2012-11-13 MED ORDER — LACTATED RINGERS IR SOLN
Status: DC | PRN
  Administered 2012-11-13: 3000 mL

## 2012-11-13 MED ORDER — LACTATED RINGERS IV SOLN
INTRAVENOUS | Status: DC | PRN
  Administered 2012-11-13 (×2): via INTRAVENOUS

## 2012-11-13 MED ORDER — CEFAZOLIN SODIUM-DEXTROSE 2-3 GM-% IV SOLR
2.0000 g | INTRAVENOUS | Status: AC
  Administered 2012-11-13: 2 g via INTRAVENOUS

## 2012-11-13 MED ORDER — TRAMADOL HCL 50 MG PO TABS: 50.0000 mg | ORAL_TABLET | Freq: Four times a day (QID) | ORAL | Status: DC | PRN

## 2012-11-13 MED ORDER — FENTANYL CITRATE 0.05 MG/ML IJ SOLN
INTRAMUSCULAR | Status: DC | PRN
  Administered 2012-11-13 (×2): 50 ug via INTRAVENOUS
  Administered 2012-11-13: 100 ug via INTRAVENOUS
  Administered 2012-11-13: 50 ug via INTRAVENOUS

## 2012-11-13 MED ORDER — ROCURONIUM BROMIDE 100 MG/10ML IV SOLN
INTRAVENOUS | Status: DC | PRN
  Administered 2012-11-13: 40 mg via INTRAVENOUS

## 2012-11-13 MED ORDER — GLYCOPYRROLATE 0.2 MG/ML IJ SOLN
INTRAMUSCULAR | Status: DC | PRN
  Administered 2012-11-13: 0.2 mg via INTRAVENOUS

## 2012-11-13 MED ORDER — SCOPOLAMINE 1 MG/3DAYS TD PT72
1.0000 | MEDICATED_PATCH | TRANSDERMAL | Status: DC
  Administered 2012-11-13: 1.5 mg via TRANSDERMAL

## 2012-11-13 MED ORDER — PROPOFOL 10 MG/ML IV EMUL
INTRAVENOUS | Status: DC | PRN
  Administered 2012-11-13: 200 mg via INTRAVENOUS

## 2012-11-13 MED ORDER — DEXAMETHASONE SODIUM PHOSPHATE 10 MG/ML IJ SOLN
INTRAMUSCULAR | Status: AC
  Filled 2012-11-13: qty 1

## 2012-11-13 MED ORDER — DEXAMETHASONE SODIUM PHOSPHATE 4 MG/ML IJ SOLN
INTRAMUSCULAR | Status: DC | PRN
  Administered 2012-11-13: 10 mg via INTRAVENOUS

## 2012-11-13 MED ORDER — PROPOFOL 10 MG/ML IV EMUL
INTRAVENOUS | Status: AC
  Filled 2012-11-13: qty 20

## 2012-11-13 MED ORDER — CEFAZOLIN SODIUM-DEXTROSE 2-3 GM-% IV SOLR
INTRAVENOUS | Status: AC
  Filled 2012-11-13: qty 50

## 2012-11-13 MED ORDER — LIDOCAINE HCL (CARDIAC) 20 MG/ML IV SOLN
INTRAVENOUS | Status: AC
  Filled 2012-11-13: qty 5

## 2012-11-13 MED ORDER — HYDROMORPHONE HCL PF 1 MG/ML IJ SOLN
INTRAMUSCULAR | Status: AC
Start: 2012-11-13 — End: 2012-11-13
  Administered 2012-11-13: 0.5 mg via INTRAVENOUS
  Filled 2012-11-13: qty 1

## 2012-11-13 MED ORDER — ROPIVACAINE HCL 5 MG/ML IJ SOLN
INTRAMUSCULAR | Status: DC | PRN
  Administered 2012-11-13: 120 mL

## 2012-11-13 MED ORDER — ARTIFICIAL TEARS OP OINT
TOPICAL_OINTMENT | OPHTHALMIC | Status: DC | PRN
  Administered 2012-11-13: 1 via OPHTHALMIC

## 2012-11-13 MED ORDER — MIDAZOLAM HCL 5 MG/5ML IJ SOLN
INTRAMUSCULAR | Status: DC | PRN
  Administered 2012-11-13: 2 mg via INTRAVENOUS

## 2012-11-13 MED ORDER — LACTATED RINGERS IV SOLN
INTRAVENOUS | Status: DC
  Administered 2012-11-13: 07:00:00 via INTRAVENOUS

## 2012-11-13 MED ORDER — HYDROMORPHONE HCL PF 1 MG/ML IJ SOLN: 0.2500 mg | INTRAMUSCULAR | Status: DC | PRN

## 2012-11-13 MED ORDER — OXYCODONE-ACETAMINOPHEN 5-325 MG PO TABS
1.0000 | ORAL_TABLET | ORAL | Status: DC | PRN
  Administered 2012-11-13: 2 via ORAL
  Filled 2012-11-13: qty 2

## 2012-11-13 SURGICAL SUPPLY — 66 items
BAG URINE DRAINAGE (UROLOGICAL SUPPLIES) ×2 IMPLANT
BARRIER ADHS 3X4 INTERCEED (GAUZE/BANDAGES/DRESSINGS) IMPLANT
CATH FOLEY 3WAY  5CC 16FR (CATHETERS) ×1
CATH FOLEY 3WAY 5CC 16FR (CATHETERS) ×1 IMPLANT
CHLORAPREP W/TINT 26ML (MISCELLANEOUS) ×2 IMPLANT
CLOTH BEACON ORANGE TIMEOUT ST (SAFETY) ×2 IMPLANT
CONT PATH 16OZ SNAP LID 3702 (MISCELLANEOUS) ×2 IMPLANT
COVER MAYO STAND STRL (DRAPES) ×2 IMPLANT
COVER TABLE BACK 60X90 (DRAPES) ×4 IMPLANT
COVER TIP SHEARS 8 DVNC (MISCELLANEOUS) ×1 IMPLANT
COVER TIP SHEARS 8MM DA VINCI (MISCELLANEOUS) ×1
DECANTER SPIKE VIAL GLASS SM (MISCELLANEOUS) ×2 IMPLANT
DERMABOND ADVANCED (GAUZE/BANDAGES/DRESSINGS) ×1
DERMABOND ADVANCED .7 DNX12 (GAUZE/BANDAGES/DRESSINGS) ×1 IMPLANT
DRAPE HUG U DISPOSABLE (DRAPE) ×2 IMPLANT
DRAPE LG THREE QUARTER DISP (DRAPES) ×4 IMPLANT
DRAPE WARM FLUID 44X44 (DRAPE) ×2 IMPLANT
ELECT REM PT RETURN 9FT ADLT (ELECTROSURGICAL) ×2
ELECTRODE REM PT RTRN 9FT ADLT (ELECTROSURGICAL) ×1 IMPLANT
EVACUATOR SMOKE 8.L (FILTER) ×2 IMPLANT
GAUZE VASELINE 3X9 (GAUZE/BANDAGES/DRESSINGS) IMPLANT
GLOVE BIO SURGEON STRL SZ7.5 (GLOVE) ×6 IMPLANT
GOWN STRL REIN XL XLG (GOWN DISPOSABLE) ×16 IMPLANT
GYRUS RUMI II 2.5CM BLUE (DISPOSABLE)
GYRUS RUMI II 3.5CM BLUE (DISPOSABLE)
GYRUS RUMI II 4.0CM BLUE (DISPOSABLE)
KIT ACCESSORY DA VINCI DISP (KITS) ×1
KIT ACCESSORY DVNC DISP (KITS) ×1 IMPLANT
LEGGING LITHOTOMY PAIR STRL (DRAPES) ×2 IMPLANT
NEEDLE INSUFFLATION 120MM (ENDOMECHANICALS) ×2 IMPLANT
PACK LAVH (CUSTOM PROCEDURE TRAY) ×2 IMPLANT
PAD PREP 24X48 CUFFED NSTRL (MISCELLANEOUS) ×4 IMPLANT
PLUG CATH AND CAP STER (CATHETERS) ×2 IMPLANT
PROTECTOR NERVE ULNAR (MISCELLANEOUS) ×4 IMPLANT
RUMI II 3.0CM BLUE KOH-EFFICIE (DISPOSABLE) ×4 IMPLANT
RUMI II GYRUS 2.5CM BLUE (DISPOSABLE) IMPLANT
RUMI II GYRUS 3.5CM BLUE (DISPOSABLE) IMPLANT
RUMI II GYRUS 4.0CM BLUE (DISPOSABLE) IMPLANT
SET CYSTO W/LG BORE CLAMP LF (SET/KITS/TRAYS/PACK) ×2 IMPLANT
SET IRRIG TUBING LAPAROSCOPIC (IRRIGATION / IRRIGATOR) ×2 IMPLANT
SOLUTION ELECTROLUBE (MISCELLANEOUS) ×2 IMPLANT
SUT VIC AB 0 CT1 27 (SUTURE) ×2
SUT VIC AB 0 CT1 27XBRD ANBCTR (SUTURE) ×2 IMPLANT
SUT VIC AB 0 CT1 27XBRD ANTBC (SUTURE) IMPLANT
SUT VICRYL 0 UR6 27IN ABS (SUTURE) ×2 IMPLANT
SUT VICRYL RAPIDE 4/0 PS 2 (SUTURE) ×4 IMPLANT
SUT VLOC 180 0 9IN  GS21 (SUTURE)
SUT VLOC 180 0 9IN GS21 (SUTURE) IMPLANT
SYR 50ML LL SCALE MARK (SYRINGE) ×2 IMPLANT
SYRINGE 10CC LL (SYRINGE) ×2 IMPLANT
SYSTEM CONVERTIBLE TROCAR (TROCAR) IMPLANT
TIP RUMI ORANGE 6.7MMX12CM (TIP) IMPLANT
TIP UTERINE 5.1X6CM LAV DISP (MISCELLANEOUS) IMPLANT
TIP UTERINE 6.7X10CM GRN DISP (MISCELLANEOUS) IMPLANT
TIP UTERINE 6.7X6CM WHT DISP (MISCELLANEOUS) IMPLANT
TIP UTERINE 6.7X8CM BLUE DISP (MISCELLANEOUS) ×4 IMPLANT
TOWEL OR 17X24 6PK STRL BLUE (TOWEL DISPOSABLE) ×6 IMPLANT
TROCAR BLADELESS OPT 12M 100M (ENDOMECHANICALS) IMPLANT
TROCAR DISP BLADELESS 8 DVNC (TROCAR) ×1 IMPLANT
TROCAR DISP BLADELESS 8MM (TROCAR) ×1
TROCAR XCEL 12X100 BLDLESS (ENDOMECHANICALS) IMPLANT
TROCAR XCEL NON-BLD 5MMX100MML (ENDOMECHANICALS) ×2 IMPLANT
TROCAR Z-THREAD 12X150 (TROCAR) ×2 IMPLANT
TUBING FILTER THERMOFLATOR (ELECTROSURGICAL) ×2 IMPLANT
WARMER LAPAROSCOPE (MISCELLANEOUS) ×2 IMPLANT
WATER STERILE IRR 1000ML POUR (IV SOLUTION) ×6 IMPLANT

## 2012-11-13 NOTE — Transfer of Care (Signed)
Immediate Anesthesia Transfer of Care Note  Patient: Jody Dunn  Procedure(s) Performed: Procedure(s): ROBOTIC ASSISTED TOTAL HYSTERECTOMY WITH BILATERAL SALPINGO OOPHORECTOMY (N/A)  Patient Location: PACU  Anesthesia Type:General  Level of Consciousness: awake, alert  and oriented  Airway & Oxygen Therapy: Patient Spontanous Breathing and Patient connected to nasal cannula oxygen  Post-op Assessment: Report given to PACU RN and Post -op Vital signs reviewed and stable  Post vital signs: Reviewed and stable  Complications: No apparent anesthesia complications

## 2012-11-13 NOTE — H&P (Signed)
Jody Dunn, FREY NO.:  [id number]  MEDICAL RECORD NO.:  [id number]  LOCATION:                                 FACILITY:  PHYSICIAN:  Lenoard Aden, M.D.DATE OF BIRTH:  [birth year]  DATE OF ADMISSION:  11/13/2012 DATE OF DISCHARGE:                             HISTORY & PHYSICAL   CHIEF COMPLAINT:  History of estrogen receptor, progesterone receptor positive breast cancer, planning tamoxifen therapy with desire for definitive therapy.  She is a 37 year old white female, G2, P2, who presents with aforementioned indications for hysterectomy and bilateral salpingectomy.  MEDICATIONS:  ParaGard, tamoxifen, Herceptin, and probiotic.  SOCIAL HISTORY:  She is a nonsmoker, nondrinker.  She denies domestic or physical violence.  ALLERGIES:  She has allergies to DuraPrep, penicillin, and Zofran.  FAMILY HISTORY:  MI, COPD, migraine, symptom of breast cancer, hypercholesterolemia, anemia, and Ehlers-Danlos syndrome.  She has a history of 2 vaginal deliveries.  PAST SURGICAL HISTORY:  Remarkable for knee surgery, shoulder surgery, and appendectomy.  PHYSICAL EXAMINATION:  GENERAL:  She is a well-developed, well- nourished, white female, in no acute distress.  Weight of 177 pounds. Height of 67 inches. HEENT:  Normal. NECK:  Supple.  Full range of motion. LUNGS:  Clear. HEART:  Regular rhythm.  Soft, nontender. PELVIC:  Normal size uterus and no adnexal masses. EXTREMITIES:  There are no cords. NEUROLOGIC:  Nonfocal. SKIN:  Intact.  IMPRESSION:  New-onset breast cancer in a premenopausal female, estrogen and progesterone receptive positive with desire for definitive therapy.  PLAN:  Planning tamoxifen with concerns regarding endometrial issues, plans to proceed with da Vinci total laparoscopic hysterectomy, bilateral salpingectomy, and bilateral oophorectomy.  Risks of anesthesia, infection, bleeding, injury to abdominal organs, need for repair was  discussed, delayed versus immediate complications to include bowel and bladder injury are noted, symptomatic menopause discussed.  The patient acknowledges and wishes to proceed.     Lenoard Aden, M.D.     RJT/MEDQ  D:  11/12/2012  T:  11/12/2012  Job:  [id number]

## 2012-11-13 NOTE — Anesthesia Procedure Notes (Signed)
Procedure Name: Intubation Date/Time: 11/13/2012 7:45 AM Performed by: Hina Gupta, Jannet Askew Pre-anesthesia Checklist: Patient identified, Patient being monitored, Emergency Drugs available, Timeout performed and Suction available Patient Re-evaluated:Patient Re-evaluated prior to inductionOxygen Delivery Method: Circle system utilized Preoxygenation: Pre-oxygenation with 100% oxygen Intubation Type: IV induction Ventilation: Mask ventilation without difficulty Laryngoscope Size: Mac and 4 Grade View: Grade I Tube size: 7.0 mm Number of attempts: 1 Secured at: 21 cm Dental Injury: Teeth and Oropharynx as per pre-operative assessment

## 2012-11-13 NOTE — Op Note (Signed)
11/13/2012  9:33 AM  PATIENT:  Jody Dunn  37 y.o. 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  POST-OPERATIVE DIAGNOSIS:  Breast Cancer Enterocele Pelvic adhesions  PROCEDURE:  Procedure(s): ROBOTIC ASSISTED TOTAL HYSTERECTOMY WITH BILATERAL SALPINGO OOPHORECTOMY MCCALL Cul de plasty Lysis of adhesions  SURGEON:  Surgeon(s): Lenoard Aden, MD  ASSISTANTSSeymour Bars, MD   ANESTHESIA:   local and general  ESTIMATED BLOOD LOSS: * No blood loss amount entered *   DRAINS: Urinary Catheter (Foley)   LOCAL MEDICATIONS USED:  MARCAINE    and Amount: 10 ml  SPECIMEN:  Source of Specimen:  uterus, cervix and bilateral tubes and ovaries  DISPOSITION OF SPECIMEN:  PATHOLOGY  COUNTS:  YES  DICTATION #: [id number]  PLAN OF CARE: DC home today  PATIENT DISPOSITION:  PACU - hemodynamically stable.

## 2012-11-13 NOTE — Op Note (Signed)
NAME:  Jody Dunn, Jody Dunn NO.:  [id number]  MEDICAL RECORD NO.:  [id number]  LOCATION:  9303                          FACILITY:  [facility]  PHYSICIAN:  Lenoard Aden, M.D.DATE OF BIRTH:  11-15-1975  DATE OF PROCEDURE: DATE OF DISCHARGE:                              OPERATIVE REPORT   DESCRIPTION OF PROCEDURE:  After being apprised of risks of anesthesia, infection, bleeding, possible injury to abdominal organs, need for repair, delayed versus immediate complications to include bowel and bladder injury, possible need for repair, issues regarding symptomatic menopause with inability to take HRT the patient was brought to the operating room where she was administered a general anesthetic without complications, prepped and draped in usual sterile fashion.  Feet were placed in Yellofin stirrups.  Foley catheter and RUMI retractor placed per vagina.  Infraumbilical incision then made with a scalpel.  Veress needle placed, opening pressure -2, 4 L of CO2 insufflated without difficulty.  Trocar was placed atraumatically.  Visualization reveals some pelvic adhesions to the right adnexa from previous appendectomy. Normal tubes, normal ovaries.  Normal anterior and posterior cul-de-sac. Enterocele noted.  At this time, the robotic ports were placed one on the left, one on the right, and a 5-mm port on the left.  The robot was docked in the standard fashion after achieving deep Trendelenburg position.  At this time, attention was turned to the robotic portion of the procedure whereby an Endo-shears and PK forceps were placed.  At this time, the left ureter was identified.  The retroperitoneal space was entered.  The infundibulopelvic ligament was skeletonized, cauterized in a 3-point fashion, and divided.  The ureter was skeletonized along the medial leaf of the peritoneum.  The round ligament was divided and the bladder flap was developed sharply.  The uterine vessels were  skeletonized on the left, cauterized, but not divided.  On the right side, adhesions were lysed sharply, and the area of the right adnexa from previous appendectomy.  At this time, the retroperitoneal space was entered.  The ureter was identified.  The infundibulopelvic ligament was skeletonized, cauterized in a 3-point fashion using bipolar cautery and divided.  At this time, ureters then identified along the medial leaf of the peritoneum.  The round ligament was opened sharply and the bladder flap was further developed skeletonizing the right uterine vessels which were then cauterized and divided.  The left uterine vessels were then cauterized and divided. The specimen was then freed at the cervicovaginal junction in a circumferential manner using Endo Shears.  At this time, the specimen was retracted within the vagina, weighed at 162 g.  The vagina has assuredly good hemostasis, was closed using a 0 V-Loc suture.  At the left angle and the right angle modified Richardson suture was placed and a continuous running closure was performed.  A McCall culdoplasty suture was then done in a standard fashion.  Good hemostasis was noted. Ureters were seen peristalsing normally bilaterally.  Urine was clear and copious.  At this time, the procedure was terminated after the needle was removed.  All robotic ports were removed under direct visualization and the robot was undocked.  Incisions were then closed after  trocars were removed using 0 Vicryl, 4-0 Vicryl, and Dermabond.  Vaginal exam revealed the incision to be intact and well approximated with good posterior support.  The patient tolerated the procedure well, was then awakened, and transferred to recovery in good condition.     Lenoard Aden, M.D.     RJT/MEDQ  D:  11/13/2012  T:  11/13/2012  Job:  [id number]

## 2012-11-13 NOTE — Progress Notes (Signed)
Patient ID: Jody Dunn, female   DOB: 11/26/1975, 37 y.o.   MRN: [id number] Patient seen and examined. Consent witnessed and signed. No changes noted. Update completed.

## 2012-11-13 NOTE — Progress Notes (Signed)
Per Dr. Darnelle Catalan : No more neulasta--thanks!

## 2012-11-13 NOTE — Anesthesia Postprocedure Evaluation (Signed)
  Anesthesia Post-op Note  Anesthesia Post Note  Patient: Jody Dunn  Procedure(s) Performed: Procedure(s) (LRB): ROBOTIC ASSISTED TOTAL HYSTERECTOMY WITH BILATERAL SALPINGO OOPHORECTOMY (N/A)  Anesthesia type: General  Patient location: PACU  Post pain: Pain level controlled  Post assessment: Post-op Vital signs reviewed  Last Vitals:  Filed Vitals:   11/13/12 1030  BP:   Pulse: 79  Temp: 36.6 C  Resp: 16    Post vital signs: Reviewed  Level of consciousness: sedated  Complications: No apparent anesthesia complications

## 2012-11-13 NOTE — Anesthesia Postprocedure Evaluation (Signed)
  Anesthesia Post Note  Patient: Jody Dunn  Procedure(s) Performed: Procedure(s) (LRB): ROBOTIC ASSISTED TOTAL HYSTERECTOMY WITH BILATERAL SALPINGO OOPHORECTOMY (N/A)  Anesthesia type: GA  Patient location: PACU  Post pain: Pain level controlled  Post assessment: Post-op Vital signs reviewed  Last Vitals:  Filed Vitals:   11/13/12 1030  BP:   Pulse: 79  Temp: 36.6 C  Resp: 16    Post vital signs: Reviewed  Level of consciousness: sedated  Complications: No apparent anesthesia complications

## 2012-11-13 NOTE — Progress Notes (Signed)
Day of Surgery Procedure(s) (LRB): ROBOTIC ASSISTED TOTAL HYSTERECTOMY WITH BILATERAL SALPINGO OOPHORECTOMY (N/A)  Subjective: Patient reports tolerating PO, + flatus and no problems voiding.    Objective: I have reviewed patient's vital signs, intake and output and medications.  General: alert, cooperative and appears stated age Resp: clear to auscultation bilaterally and normal percussion bilaterally Cardio: regular rate and rhythm, S1, S2 normal, no murmur, click, rub or gallop and normal apical impulse GI: soft, non-tender; bowel sounds normal; no masses,  no organomegaly and incision: clean, dry and intact Extremities: extremities normal, atraumatic, no cyanosis or edema and Homans sign is negative, no sign of DVT Vaginal Bleeding: minimal  Assessment: s/p Procedure(s): ROBOTIC ASSISTED TOTAL HYSTERECTOMY WITH BILATERAL SALPINGO OOPHORECTOMY (N/A): stable, progressing well and tolerating diet  Plan: Advance diet Encourage ambulation Discontinue IV fluids Discharge home  LOS: 0 days    Peng Thorstenson J 11/13/2012, 7:39 PM

## 2012-11-14 ENCOUNTER — Encounter (HOSPITAL_COMMUNITY): Payer: Self-pay | Admitting: Obstetrics and Gynecology

## 2012-11-14 ENCOUNTER — Other Ambulatory Visit: Payer: Self-pay | Admitting: Emergency Medicine

## 2012-11-14 ENCOUNTER — Other Ambulatory Visit: Payer: Self-pay | Admitting: Physician Assistant

## 2012-11-14 DIAGNOSIS — C50419 Malignant neoplasm of upper-outer quadrant of unspecified female breast: Secondary | ICD-10-CM

## 2012-11-14 MED ORDER — GABAPENTIN 300 MG PO CAPS: 300.0000 mg | ORAL_CAPSULE | Freq: Every evening | ORAL | Status: DC | PRN

## 2012-11-14 NOTE — Progress Notes (Signed)
Patient discharged to home accompanied by friend.  Ambulated off unit and walked to entrance.  Teaching complete.

## 2012-11-22 ENCOUNTER — Other Ambulatory Visit (HOSPITAL_BASED_OUTPATIENT_CLINIC_OR_DEPARTMENT_OTHER): Payer: Managed Care, Other (non HMO) | Admitting: Lab

## 2012-11-22 ENCOUNTER — Ambulatory Visit (HOSPITAL_BASED_OUTPATIENT_CLINIC_OR_DEPARTMENT_OTHER): Payer: Managed Care, Other (non HMO)

## 2012-11-22 ENCOUNTER — Ambulatory Visit: Payer: Managed Care, Other (non HMO) | Admitting: Physician Assistant

## 2012-11-22 ENCOUNTER — Ambulatory Visit: Payer: Managed Care, Other (non HMO)

## 2012-11-22 VITALS — BP 122/72 | HR 77 | Temp 98.5°F | Resp 17

## 2012-11-22 DIAGNOSIS — C50419 Malignant neoplasm of upper-outer quadrant of unspecified female breast: Secondary | ICD-10-CM

## 2012-11-22 DIAGNOSIS — Z5112 Encounter for antineoplastic immunotherapy: Secondary | ICD-10-CM

## 2012-11-22 DIAGNOSIS — Z170 Estrogen receptor positive status [ER+]: Secondary | ICD-10-CM

## 2012-11-22 LAB — CBC WITH DIFFERENTIAL/PLATELET
BASO%: 1 % (ref 0.0–2.0)
EOS%: 2.1 % (ref 0.0–7.0)
Eosinophils Absolute: 0.1 10*3/uL (ref 0.0–0.5)
LYMPH%: 20.1 % (ref 14.0–49.7)
MCHC: 34.2 g/dL (ref 31.5–36.0)
MCV: 91.1 fL (ref 79.5–101.0)
MONO%: 9.8 % (ref 0.0–14.0)
NEUT#: 3.3 10*3/uL (ref 1.5–6.5)
Platelets: 237 10*3/uL (ref 145–400)
RBC: 3.87 10*6/uL (ref 3.70–5.45)
RDW: 13.2 % (ref 11.2–14.5)
WBC: 4.9 10*3/uL (ref 3.9–10.3)

## 2012-11-22 MED ORDER — SODIUM CHLORIDE 0.9 % IV SOLN
Freq: Once | INTRAVENOUS | Status: AC
  Administered 2012-11-22: 09:00:00 via INTRAVENOUS

## 2012-11-22 MED ORDER — TRASTUZUMAB CHEMO INJECTION 440 MG
6.0000 mg/kg | Freq: Once | INTRAVENOUS | Status: AC
  Administered 2012-11-22: 483 mg via INTRAVENOUS
  Filled 2012-11-22: qty 23

## 2012-11-22 MED ORDER — SODIUM CHLORIDE 0.9 % IJ SOLN
10.0000 mL | INTRAMUSCULAR | Status: DC | PRN
  Administered 2012-11-22: 10 mL
  Filled 2012-11-22: qty 10

## 2012-11-22 MED ORDER — ACETAMINOPHEN 325 MG PO TABS
650.0000 mg | ORAL_TABLET | Freq: Once | ORAL | Status: AC
  Administered 2012-11-22: 650 mg via ORAL

## 2012-11-22 MED ORDER — HEPARIN SOD (PORK) LOCK FLUSH 100 UNIT/ML IV SOLN
500.0000 [IU] | Freq: Once | INTRAVENOUS | Status: AC | PRN
  Administered 2012-11-22: 500 [IU]
  Filled 2012-11-22: qty 5

## 2012-11-22 NOTE — Patient Instructions (Addendum)
[facility] Discharge Instructions for Patients Receiving Chemotherapy  Today you received the following chemotherapy agents Herceptin.  To help prevent nausea and vomiting after your treatment, we encourage you to take your nausea medication as prescribed.    If you develop nausea and vomiting that is not controlled by your nausea medication, call the clinic. If it is after clinic hours your family physician or the after hours number for the clinic or go to the Emergency Department.   BELOW ARE SYMPTOMS THAT SHOULD BE REPORTED IMMEDIATELY:  *FEVER GREATER THAN 100.5 F  *CHILLS WITH OR WITHOUT FEVER  NAUSEA AND VOMITING THAT IS NOT CONTROLLED WITH YOUR NAUSEA MEDICATION  *UNUSUAL SHORTNESS OF BREATH  *UNUSUAL BRUISING OR BLEEDING  TENDERNESS IN MOUTH AND THROAT WITH OR WITHOUT PRESENCE OF ULCERS  *URINARY PROBLEMS  *BOWEL PROBLEMS  UNUSUAL RASH Items with * indicate a potential emergency and should be followed up as soon as possible.   Please let the nurse know about any problems that you may have experienced. Feel free to call the clinic you have any questions or concerns. The clinic phone number is [phone].   I have been informed and understand all the instructions given to me. I know to contact the clinic, my physician, or go to the Emergency Department if any problems should occur. I do not have any questions at this time, but understand that I may call the clinic during office hours   should I have any questions or need assistance in obtaining follow up care.    __________________________________________  _____________  __________ Signature of Patient or Authorized Representative            Date                   Time    __________________________________________ Nurse's Signature    

## 2012-11-27 ENCOUNTER — Ambulatory Visit: Payer: Self-pay | Admitting: Radiation Oncology

## 2012-12-11 ENCOUNTER — Other Ambulatory Visit: Payer: Self-pay | Admitting: Oncology

## 2012-12-11 DIAGNOSIS — C50919 Malignant neoplasm of unspecified site of unspecified female breast: Secondary | ICD-10-CM

## 2012-12-13 ENCOUNTER — Other Ambulatory Visit (HOSPITAL_BASED_OUTPATIENT_CLINIC_OR_DEPARTMENT_OTHER): Payer: Managed Care, Other (non HMO) | Admitting: Lab

## 2012-12-13 ENCOUNTER — Ambulatory Visit (HOSPITAL_BASED_OUTPATIENT_CLINIC_OR_DEPARTMENT_OTHER): Payer: Managed Care, Other (non HMO)

## 2012-12-13 VITALS — BP 126/78 | HR 79 | Temp 97.2°F

## 2012-12-13 DIAGNOSIS — C50411 Malignant neoplasm of upper-outer quadrant of right female breast: Secondary | ICD-10-CM

## 2012-12-13 DIAGNOSIS — Z5112 Encounter for antineoplastic immunotherapy: Secondary | ICD-10-CM

## 2012-12-13 DIAGNOSIS — C50419 Malignant neoplasm of upper-outer quadrant of unspecified female breast: Secondary | ICD-10-CM

## 2012-12-13 LAB — CBC WITH DIFFERENTIAL/PLATELET
BASO%: 0.4 % (ref 0.0–2.0)
Eosinophils Absolute: 0.2 10*3/uL (ref 0.0–0.5)
LYMPH%: 22.7 % (ref 14.0–49.7)
MCHC: 33.2 g/dL (ref 31.5–36.0)
MONO#: 0.4 10*3/uL (ref 0.1–0.9)
NEUT#: 3.4 10*3/uL (ref 1.5–6.5)
RBC: 3.99 10*6/uL (ref 3.70–5.45)
RDW: 12.4 % (ref 11.2–14.5)
WBC: 5.2 10*3/uL (ref 3.9–10.3)
lymph#: 1.2 10*3/uL (ref 0.9–3.3)

## 2012-12-13 MED ORDER — SODIUM CHLORIDE 0.9 % IJ SOLN
10.0000 mL | INTRAMUSCULAR | Status: DC | PRN
  Administered 2012-12-13: 10 mL
  Filled 2012-12-13: qty 10

## 2012-12-13 MED ORDER — TRASTUZUMAB CHEMO INJECTION 440 MG
6.0000 mg/kg | Freq: Once | INTRAVENOUS | Status: AC
  Administered 2012-12-13: 483 mg via INTRAVENOUS
  Filled 2012-12-13: qty 23

## 2012-12-13 MED ORDER — ACETAMINOPHEN 325 MG PO TABS
650.0000 mg | ORAL_TABLET | Freq: Once | ORAL | Status: AC
  Administered 2012-12-13: 650 mg via ORAL

## 2012-12-13 MED ORDER — SODIUM CHLORIDE 0.9 % IV SOLN
Freq: Once | INTRAVENOUS | Status: AC
  Administered 2012-12-13: 09:00:00 via INTRAVENOUS

## 2012-12-13 MED ORDER — HEPARIN SOD (PORK) LOCK FLUSH 100 UNIT/ML IV SOLN
500.0000 [IU] | Freq: Once | INTRAVENOUS | Status: AC | PRN
  Administered 2012-12-13: 500 [IU]
  Filled 2012-12-13: qty 5

## 2012-12-13 NOTE — Patient Instructions (Addendum)
[facility] Discharge Instructions for Patients Receiving Chemotherapy  Today you received the following chemotherapy agents Herceptin.  To help prevent nausea and vomiting after your treatment, we encourage you to take your nausea medication as prescribed.   If you develop nausea and vomiting that is not controlled by your nausea medication, call the clinic. If it is after clinic hours your family physician or the after hours number for the clinic or go to the Emergency Department.   BELOW ARE SYMPTOMS THAT SHOULD BE REPORTED IMMEDIATELY:  *FEVER GREATER THAN 100.5 F  *CHILLS WITH OR WITHOUT FEVER  NAUSEA AND VOMITING THAT IS NOT CONTROLLED WITH YOUR NAUSEA MEDICATION  *UNUSUAL SHORTNESS OF BREATH  *UNUSUAL BRUISING OR BLEEDING  TENDERNESS IN MOUTH AND THROAT WITH OR WITHOUT PRESENCE OF ULCERS  *URINARY PROBLEMS  *BOWEL PROBLEMS  UNUSUAL RASH Items with * indicate a potential emergency and should be followed up as soon as possible.  Feel free to call the clinic you have any questions or concerns. The clinic phone number is [phone].   I have been informed and understand all the instructions given to me. I know to contact the clinic, my physician, or go to the Emergency Department if any problems should occur. I do not have any questions at this time, but understand that I may call the clinic during office hours   should I have any questions or need assistance in obtaining follow up care.    __________________________________________  _____________  __________ Signature of Patient or Authorized Representative            Date                   Time    __________________________________________ Nurse's Signature    

## 2012-12-22 ENCOUNTER — Encounter: Payer: Self-pay | Admitting: *Deleted

## 2013-01-04 ENCOUNTER — Encounter: Payer: Self-pay | Admitting: Oncology

## 2013-01-04 ENCOUNTER — Other Ambulatory Visit (HOSPITAL_BASED_OUTPATIENT_CLINIC_OR_DEPARTMENT_OTHER): Payer: Managed Care, Other (non HMO) | Admitting: Lab

## 2013-01-04 ENCOUNTER — Telehealth: Payer: Self-pay | Admitting: *Deleted

## 2013-01-04 ENCOUNTER — Ambulatory Visit (HOSPITAL_BASED_OUTPATIENT_CLINIC_OR_DEPARTMENT_OTHER): Payer: Managed Care, Other (non HMO)

## 2013-01-04 ENCOUNTER — Ambulatory Visit (HOSPITAL_BASED_OUTPATIENT_CLINIC_OR_DEPARTMENT_OTHER): Payer: Managed Care, Other (non HMO) | Admitting: Physician Assistant

## 2013-01-04 ENCOUNTER — Telehealth: Payer: Self-pay | Admitting: Oncology

## 2013-01-04 ENCOUNTER — Encounter: Payer: Self-pay | Admitting: Physician Assistant

## 2013-01-04 VITALS — BP 124/82 | HR 84 | Temp 98.4°F | Resp 20 | Ht 68.0 in | Wt 178.1 lb

## 2013-01-04 DIAGNOSIS — C50419 Malignant neoplasm of upper-outer quadrant of unspecified female breast: Secondary | ICD-10-CM

## 2013-01-04 DIAGNOSIS — Z170 Estrogen receptor positive status [ER+]: Secondary | ICD-10-CM

## 2013-01-04 DIAGNOSIS — C50411 Malignant neoplasm of upper-outer quadrant of right female breast: Secondary | ICD-10-CM

## 2013-01-04 DIAGNOSIS — Z5112 Encounter for antineoplastic immunotherapy: Secondary | ICD-10-CM

## 2013-01-04 DIAGNOSIS — Z90710 Acquired absence of both cervix and uterus: Secondary | ICD-10-CM

## 2013-01-04 LAB — CBC WITH DIFFERENTIAL/PLATELET
BASO%: 0.7 % (ref 0.0–2.0)
Eosinophils Absolute: 0.3 10*3/uL (ref 0.0–0.5)
HCT: 35.4 % (ref 34.8–46.6)
HGB: 12.1 g/dL (ref 11.6–15.9)
MCHC: 34.3 g/dL (ref 31.5–36.0)
MONO#: 0.4 10*3/uL (ref 0.1–0.9)
NEUT#: 4.6 10*3/uL (ref 1.5–6.5)
NEUT%: 68.4 % (ref 38.4–76.8)
Platelets: 239 10*3/uL (ref 145–400)
WBC: 6.7 10*3/uL (ref 3.9–10.3)
lymph#: 1.3 10*3/uL (ref 0.9–3.3)

## 2013-01-04 LAB — COMPREHENSIVE METABOLIC PANEL (CC13)
ALT: 23 U/L (ref 0–55)
CO2: 24 mEq/L (ref 22–29)
Calcium: 8.7 mg/dL (ref 8.4–10.4)
Chloride: 106 mEq/L (ref 98–107)
Creatinine: 1.1 mg/dL (ref 0.6–1.1)
Glucose: 92 mg/dl (ref 70–99)
Total Protein: 7.1 g/dL (ref 6.4–8.3)

## 2013-01-04 MED ORDER — SODIUM CHLORIDE 0.9 % IJ SOLN
10.0000 mL | INTRAMUSCULAR | Status: DC | PRN
  Administered 2013-01-04: 10 mL
  Filled 2013-01-04: qty 10

## 2013-01-04 MED ORDER — ACETAMINOPHEN 325 MG PO TABS
650.0000 mg | ORAL_TABLET | Freq: Once | ORAL | Status: AC
  Administered 2013-01-04: 650 mg via ORAL

## 2013-01-04 MED ORDER — TRASTUZUMAB CHEMO INJECTION 440 MG
6.0000 mg/kg | Freq: Once | INTRAVENOUS | Status: AC
  Administered 2013-01-04: 483 mg via INTRAVENOUS
  Filled 2013-01-04: qty 23

## 2013-01-04 MED ORDER — SODIUM CHLORIDE 0.9 % IV SOLN
Freq: Once | INTRAVENOUS | Status: AC
  Administered 2013-01-04: 11:00:00 via INTRAVENOUS

## 2013-01-04 MED ORDER — HEPARIN SOD (PORK) LOCK FLUSH 100 UNIT/ML IV SOLN
500.0000 [IU] | Freq: Once | INTRAVENOUS | Status: AC | PRN
  Administered 2013-01-04: 500 [IU]
  Filled 2013-01-04: qty 5

## 2013-01-04 NOTE — Progress Notes (Signed)
ID: Jody Dunn   DOB: [birth year]  MR#: [id number]  NWG#:[id number]  PCP: [name], CNM GYN: Olivia Mackie SU: Abigail Miyamoto OTHER MD: Genelle Bal, Brigid Re  HISTORY OF PRESENT ILLNESS: Jody Dunn had a mammogram at [facility] of 2010 because of shooting pains in the right breast. This found no worrisome finding. More recently, in the spring of 2012, the patient at delivered her second child. She nursed but the child refused to take milk from the right breast. The patient tells me that she has been able to palpate easily from the right breast, so production is not an issue. More recently, early June 2013, the patient felt a new lump in the right breast, and brought this to the attention of [name] at [facility]. The patient had repeat mammography at [facility] 02/07/2012. This found at the breast tissue to be heterogeneously dense, which is not a surprise given that the patient is lactating. There was diffuse skin thickening. Erythema is not described. The nipple was retracted. There was no definitive peau d'orange appearance. Pleomorphic calcifications in the lateral right breast extended approximately 11 cm, without definite mass. On ultrasound there was a vague irregular hypoechoic mass measuring approximately 15 mm in the right breast, with a second ill-defined hypoechoic lobulated mass measuring approximately 17 mm. Both these masses were biopsied 02/09/2012, and the final pathology ([id number]) showed both IIb invasive ductal carcinoma, with a prognostic panel (from the mass at 11:00) as follows: estrogen receptor positive at 90%, progesterone receptor positive at 90%, Mib-1 of 30%, and HER-2 amplification with a ratio of 4.29 by CISH. MRI of the breast was performed 02/15/2012 and showed the mass in the 10:00 position to measure 3.1 cm, the one at the 11:00 position to measure 3.1 cm. There was diffuse skin thickening in the right breast and 2 enhancing abnormal-appearing lower  right axillary lymph nodes measuring 2.0 and 1.8 cm respectively. There was no evidence for internal mammary adenopathy, left axillary adenopathy, or significant findings in the left breast.  INTERVAL HISTORY: Jody Dunn returns today for followup of her right breast cancer.  She continues to receive trastuzumab every 3 weeks, and continues on tamoxifen, both of which she is tolerating reasonably well.  Interval history is remarkable for Jody Dunn having undergone a hysterectomy and bilateral salpingo-oophorectomy on 11/13/2012. She tolerated the procedure well, with no complications. She's had no vaginal bleeding at all and minimal pain. Her only complaints are increased hot flashes and mood swings.  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 s. She's eating and drinking well and has no nausea or change in bowel or bladder habits. She denies any cough, shortness of breath, orthopnea, or chest pain. She's had no abnormal headaches or dizziness. She denies any unusual myalgias, arthralgias, bony pain, or peripheral swelling.   A detailed review of systems is otherwise stable and noncontributory.  PAST MEDICAL HISTORY: Past Medical History  Diagnosis Date  . Iron (Fe) deficiency anemia   . Asthma     exercise induced  . Breast cancer   . Back pain   . Migraines   . Lactose intolerance   . Hyperlipidemia     PAST SURGICAL HISTORY: Past Surgical History  Procedure Laterality Date  . Appendectomy    .  Shoulder surgery    . Knee surgery      X2  . Portacath placement  02/21/2012    Procedure: INSERTION PORT-A-CATH;  Surgeon: Almond Lint, MD;  Location: [facility];  Service: General;  Laterality: N/A;  . Skin biopsy  02/21/2012    Procedure: BIOPSY SKIN;  Surgeon: Almond Lint, MD;   Location: [facility];  Service: General;  Laterality: Right;  . Breast biopsy  02/09/2012  . Mastectomy modified radical  06/28/2012    Procedure: MASTECTOMY MODIFIED RADICAL;  Surgeon: Almond Lint, MD;  Location: [facility];  Service: General;  Laterality: Right;  Right Modified Radical Mastectomy and Left Simple Mastectomy  . Simple mastectomy with axillary sentinel node biopsy  06/28/2012    Procedure: SIMPLE MASTECTOMY;  Surgeon: Almond Lint, MD;  Location: [facility];  Service: General;  Laterality: Left;  . Robotic assisted total hysterectomy with bilateral salpingo oopherectomy N/A 11/13/2012    Procedure: ROBOTIC ASSISTED TOTAL HYSTERECTOMY WITH BILATERAL SALPINGO OOPHORECTOMY;  Surgeon: Lenoard Aden, MD;  Location: [facility];  Service: Gynecology;  Laterality: N/A;    FAMILY HISTORY Family History  Problem Relation Age of Onset  . Cancer Paternal Aunt 8    Breast cancer (half sibling, related through father)  . Spina bifida Paternal Uncle 0    died around 55-71 months of age  . Cancer Maternal Grandmother 74    colon cancer  . Dementia Maternal Grandfather   . Cancer Paternal Grandmother     breast cancer between 46-42  . Cancer Paternal Grandfather     brain cancer  . Cancer Other     Paternal grandmother's siblings had breast cancer and pancreatic cancer  . Cancer Other 11    maternal great grandmother with breast cancer   the patient's parents are alive, in their early 6s. The patient is an only child. There is a significant family history of cancer including breast, brain, and colon, detailed in our genetic counselors report. BRCA and P. 53 testing is negative  GYNECOLOGIC HISTORY: Menarche age 3; she is GX P2, first live birth age 57.  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 Her husband [name]. works as a Sports administrator for [employer]. Their children are Jody Dunn (2008) and Jody Dunn (2012).   ADVANCED DIRECTIVES: not in place  HEALTH MAINTENANCE: History  Substance Use Topics  . Smoking status: Never Smoker   . Smokeless tobacco: Never Used  . Alcohol Use: 2.5 - 3.5 oz/week    5-7 drink(s) per week     Comment: OCCASIONAL     Colonoscopy: no  PAP: 2012  Bone density: no  Lipid panel:  Allergies  Allergen Reactions  . Zofran (Ondansetron Hcl) Nausea And Vomiting and Other (See Comments)    Migraine headaches and vomiting  . Oxycodone Itching and Rash  . Antiseptic Products, Misc.     duraprep-rash  . Duraprep (Antiseptic Products, Misc.) Hives and Rash  . Penicillins Hives and Nausea And Vomiting    vomiting    Current Outpatient Prescriptions  Medication Sig Dispense Refill  . naproxen sodium (ANAPROX) 220 MG tablet Take 220 mg by mouth daily as needed.       . tamoxifen (NOLVADEX) 20 MG tablet Take 1 tablet (20 mg total) by mouth daily.  90 tablet  6  . gabapentin (NEURONTIN)  300 MG capsule Take 1 capsule (300 mg total) by mouth at bedtime as needed (hot flashes).  30 capsule  3  . LORazepam (ATIVAN) 0.5 MG tablet TAKE 1 TABLET BY MOUTH EVERY NIGHT AT BEDTIME AS NEEDED  30 tablet  2  . Probiotic Product (PROBIOTIC & ACIDOPHILUS EX ST) CAPS Take 1 capsule by mouth daily.        No current facility-administered medications for this visit.   Facility-Administered Medications Ordered in Other Visits  Medication Dose Route Frequency Provider Last Rate Last Dose  . sodium chloride 0.9 % injection 10 mL  10 mL Intracatheter PRN Catalina Gravel, PA-C   10 mL at 11/01/12 1134    OBJECTIVE: Young white woman who appears well Filed Vitals:   01/04/13 0921  BP: 124/82  Pulse: 84  Temp: 98.4 F (36.9 C)  Resp: 20  ECOG:  0 Filed Weights   01/04/13 0921  Weight: 178 lb 1.6 oz (80.786 kg)    Sclerae unicteric Oropharynx clear No cervical or supraclavicular adenopathy Lungs  clear to auscultation, no wheezes, no rales or rhonchi Heart regular rate and rhythm Abdomen is soft, nontender, with positive bowel sounds MSK no focal spinal tenderness, no peripheral edema Neuro: nonfocal, well oriented with positive affect Breasts: She status post bilateral mastectomies. No evidence of local recurrence. Slight hyperpigmentation noted on the right chest wall status post radiation therapy. Otherwise unremarkable. Axillae are benign bilaterally the palpable adenopathy. Port is intact in the left upper chest wall, no erythema or edema, and no evidence of infection.   LAB RESULTS: Lab Results  Component Value Date   WBC 6.7 01/04/2013   NEUTROABS 4.6 01/04/2013   HGB 12.1 01/04/2013   HCT 35.4 01/04/2013   MCV 91.8 01/04/2013   PLT 239 01/04/2013       Chemistry      Component Value Date/Time   NA 139 01/04/2013 0905   NA 137 11/10/2012 1444   K 4.3 01/04/2013 0905   K 3.7 11/10/2012 1444   CL 106 01/04/2013 0905   CL 97 11/10/2012 1444   CO2 24 01/04/2013 0905   CO2 26 11/10/2012 1444   BUN 19.2 01/04/2013 0905   BUN 15 11/10/2012 1444   CREATININE 1.1 01/04/2013 0905   CREATININE 0.95 11/10/2012 1444      Component Value Date/Time   CALCIUM 8.7 01/04/2013 0905   CALCIUM 9.7 11/10/2012 1444   ALKPHOS 74 01/04/2013 0905   ALKPHOS 94 11/10/2012 1444   AST 26 01/04/2013 0905   AST 22 11/10/2012 1444   ALT 23 01/04/2013 0905   ALT 18 11/10/2012 1444   BILITOT 0.27 01/04/2013 0905   BILITOT 0.3 11/10/2012 1444       Lab Results  Component Value Date   LABCA2 28 02/16/2012     STUDIES:  Echocardiogram on 10/30/2012 shows an ejection fraction of 55 - 60 %.    ASSESSMENT: 37 y.o. BRCA-negative Jody Dunn woman   (1)  status post right breast biopsy 02/09/2012 for multifocal pT4 cN1, stage IIIB invasive ductal carcinoma. Prognostic profile from one of the 2 masses shows it to be triple positive with an MIB-1 of 30%.   (2)  s/p 4 dose dense cycles of doxorubicin/  cyclophosphamide, followed by 4 cycles of paclitaxel, dose-dense,completed 06/07/2012, given with trastuzumab   (3) trastuzumab to be continued to complete a year (to July 2014). Most recent echo 10/30/2012.  (4) s/p Right modified radical mastectomy (with prophylactic left simple mastectomy) 06/28/2012,  showing a residual 2.3 cm invasive ductal carcinoma, grade 3, with 0 of 14 lymph nodes involved.  (5)  Received radiation in [place] under the care of Dr. Genelle Bal, beginning January 2014 and completed in late February 2014  (6)  Started tamoxifen March 2014  (7)  status post total hysterectomy with bilateral salpingo-oophorectomy under the care of Dr. Shelly Rubenstein, 11/13/2012.   PLAN:  Jody Dunn will receive her next q. three-week dose of trastuzumab today as scheduled, and we will continue treating her on a Q. three-week basis. Her next echocardiogram is due in June, and this be scheduled for her accordingly.   She'll continue on tamoxifen, 20 mg daily, which she really seems to be tolerating well. She does not want to take gabapentin, or consider Effexor for the hot flashes. She certainly will let us know, however, if these worsen or become intolerable.  I will see her for brief followup in mid July, but she knows to call prior that time with any changes or problems.   Luzmaria Devaux    01/04/2013

## 2013-01-04 NOTE — Telephone Encounter (Signed)
, °

## 2013-01-04 NOTE — Telephone Encounter (Signed)
Per staff phone call and POF I have schedueld appts.  JMW  

## 2013-01-11 ENCOUNTER — Other Ambulatory Visit: Payer: Self-pay | Admitting: *Deleted

## 2013-01-17 ENCOUNTER — Other Ambulatory Visit: Payer: Self-pay | Admitting: Physician Assistant

## 2013-01-19 ENCOUNTER — Other Ambulatory Visit: Payer: Self-pay | Admitting: Physician Assistant

## 2013-01-19 DIAGNOSIS — C50411 Malignant neoplasm of upper-outer quadrant of right female breast: Secondary | ICD-10-CM

## 2013-01-25 ENCOUNTER — Other Ambulatory Visit (HOSPITAL_BASED_OUTPATIENT_CLINIC_OR_DEPARTMENT_OTHER): Payer: Managed Care, Other (non HMO) | Admitting: Lab

## 2013-01-25 ENCOUNTER — Other Ambulatory Visit: Payer: Self-pay | Admitting: Physician Assistant

## 2013-01-25 ENCOUNTER — Ambulatory Visit (HOSPITAL_BASED_OUTPATIENT_CLINIC_OR_DEPARTMENT_OTHER): Payer: Managed Care, Other (non HMO)

## 2013-01-25 ENCOUNTER — Ambulatory Visit (HOSPITAL_COMMUNITY)
Admission: RE | Admit: 2013-01-25 | Discharge: 2013-01-25 | Disposition: A | Discharge status: Home / Self Care | Payer: Managed Care, Other (non HMO) | Source: Ambulatory Visit | Attending: Physician Assistant | Admitting: Physician Assistant

## 2013-01-25 VITALS — BP 135/85 | HR 85 | Temp 98.2°F

## 2013-01-25 DIAGNOSIS — M79672 Pain in left foot: Secondary | ICD-10-CM

## 2013-01-25 DIAGNOSIS — C50419 Malignant neoplasm of upper-outer quadrant of unspecified female breast: Secondary | ICD-10-CM

## 2013-01-25 DIAGNOSIS — C50411 Malignant neoplasm of upper-outer quadrant of right female breast: Secondary | ICD-10-CM

## 2013-01-25 DIAGNOSIS — M79609 Pain in unspecified limb: Secondary | ICD-10-CM | POA: Insufficient documentation

## 2013-01-25 DIAGNOSIS — Z5112 Encounter for antineoplastic immunotherapy: Secondary | ICD-10-CM

## 2013-01-25 LAB — COMPREHENSIVE METABOLIC PANEL (CC13)
ALT: 23 U/L (ref 0–55)
AST: 28 U/L (ref 5–34)
Alkaline Phosphatase: 75 U/L (ref 40–150)
CO2: 27 mEq/L (ref 22–29)
Creatinine: 1 mg/dL (ref 0.6–1.1)
Sodium: 143 mEq/L (ref 136–145)
Total Bilirubin: 0.34 mg/dL (ref 0.20–1.20)

## 2013-01-25 LAB — CBC WITH DIFFERENTIAL/PLATELET
BASO%: 0.6 % (ref 0.0–2.0)
EOS%: 4.7 % (ref 0.0–7.0)
HCT: 38.3 % (ref 34.8–46.6)
LYMPH%: 26.9 % (ref 14.0–49.7)
MCH: 30.5 pg (ref 25.1–34.0)
MCHC: 33.4 g/dL (ref 31.5–36.0)
MCV: 91.2 fL (ref 79.5–101.0)
MONO%: 5.5 % (ref 0.0–14.0)
NEUT%: 62.3 % (ref 38.4–76.8)
Platelets: 282 10*3/uL (ref 145–400)
RBC: 4.2 10*6/uL (ref 3.70–5.45)

## 2013-01-25 LAB — LIPID PANEL
Cholesterol: 254 mg/dL — ABNORMAL HIGH (ref 0–200)
Total CHOL/HDL Ratio: 4.4 Ratio
Triglycerides: 308 mg/dL — ABNORMAL HIGH (ref <150)

## 2013-01-25 MED ORDER — HEPARIN SOD (PORK) LOCK FLUSH 100 UNIT/ML IV SOLN
500.0000 [IU] | Freq: Once | INTRAVENOUS | Status: AC | PRN
  Administered 2013-01-25: 500 [IU]
  Filled 2013-01-25: qty 5

## 2013-01-25 MED ORDER — SODIUM CHLORIDE 0.9 % IJ SOLN
10.0000 mL | INTRAMUSCULAR | Status: DC | PRN
  Administered 2013-01-25: 10 mL
  Filled 2013-01-25: qty 10

## 2013-01-25 MED ORDER — ACETAMINOPHEN 325 MG PO TABS
650.0000 mg | ORAL_TABLET | Freq: Once | ORAL | Status: AC
  Administered 2013-01-25: 650 mg via ORAL

## 2013-01-25 MED ORDER — SODIUM CHLORIDE 0.9 % IV SOLN
Freq: Once | INTRAVENOUS | Status: AC
  Administered 2013-01-25: 10:00:00 via INTRAVENOUS

## 2013-01-25 MED ORDER — TRASTUZUMAB CHEMO INJECTION 440 MG
6.0000 mg/kg | Freq: Once | INTRAVENOUS | Status: AC
  Administered 2013-01-25: 483 mg via INTRAVENOUS
  Filled 2013-01-25: qty 23

## 2013-01-25 NOTE — Progress Notes (Signed)
I spoke with Jody Dunn by phone this morning. She is complaining of pain in the left foot. She tells me her dogs "trampled" her foot approximately 3 weeks ago. It was initially swollen and bruised.  The swelling has improved, but it has continued to cause her pain. The pain is severe enough that she is having difficulty wearing her shoes.  I have recommended that she proceed with a plain film x-ray of the left foot, and we will reassess pending those results.  Zollie Scale, PA-C 01/25/2013

## 2013-01-25 NOTE — Patient Instructions (Signed)
Patient aware of next appointment; discharged home with no complaints. 

## 2013-02-05 ENCOUNTER — Telehealth: Payer: Self-pay | Admitting: *Deleted

## 2013-02-05 NOTE — Telephone Encounter (Signed)
Pt called to this RN to state onset of " feeling of tightness " in right arm. She denies any swelling or areas of redness. Tightness seems to be more felt at elbow area. Per discussion Brytnee denies any type of repetitive activity that could cause a tendonitis.  Plan per call is a referral for the lymphedema clinic placed for evaluation and therapy.

## 2013-02-08 ENCOUNTER — Encounter: Payer: Self-pay | Admitting: Oncology

## 2013-02-08 NOTE — Progress Notes (Signed)
Received notice from [facility] for Herceptin [phone].. Copay Card approved. I will forward to billing and medical records.

## 2013-02-12 ENCOUNTER — Ambulatory Visit (HOSPITAL_BASED_OUTPATIENT_CLINIC_OR_DEPARTMENT_OTHER)
Admission: RE | Admit: 2013-02-12 | Discharge: 2013-02-12 | Disposition: A | Discharge status: Home / Self Care | Payer: Managed Care, Other (non HMO) | Source: Ambulatory Visit | Attending: Internal Medicine | Admitting: Internal Medicine

## 2013-02-12 ENCOUNTER — Encounter (HOSPITAL_COMMUNITY): Payer: Self-pay

## 2013-02-12 ENCOUNTER — Ambulatory Visit (HOSPITAL_COMMUNITY)
Admission: RE | Admit: 2013-02-12 | Discharge: 2013-02-12 | Disposition: A | Discharge status: Home / Self Care | Payer: Managed Care, Other (non HMO) | Source: Ambulatory Visit | Attending: Internal Medicine | Admitting: Internal Medicine

## 2013-02-12 VITALS — BP 120/81 | HR 92 | Ht 68.0 in | Wt 174.0 lb

## 2013-02-12 DIAGNOSIS — C50419 Malignant neoplasm of upper-outer quadrant of unspecified female breast: Secondary | ICD-10-CM

## 2013-02-12 DIAGNOSIS — I517 Cardiomegaly: Secondary | ICD-10-CM | POA: Insufficient documentation

## 2013-02-12 DIAGNOSIS — Z09 Encounter for follow-up examination after completed treatment for conditions other than malignant neoplasm: Secondary | ICD-10-CM

## 2013-02-12 DIAGNOSIS — C50411 Malignant neoplasm of upper-outer quadrant of right female breast: Secondary | ICD-10-CM

## 2013-02-12 DIAGNOSIS — E785 Hyperlipidemia, unspecified: Secondary | ICD-10-CM | POA: Insufficient documentation

## 2013-02-12 MED ORDER — LOSARTAN POTASSIUM 25 MG PO TABS: 25.0000 mg | ORAL_TABLET | Freq: Every day | ORAL | Status: DC

## 2013-02-12 MED ORDER — CARVEDILOL 3.125 MG PO TABS: 3.1250 mg | ORAL_TABLET | Freq: Two times a day (BID) | ORAL | Status: DC

## 2013-02-12 NOTE — Progress Notes (Signed)
Patient ID: Jody Dunn, female   DOB: 04/04/1976, 37 y.o.   MRN: [id number] HPI: Referring Physician: Dr. Jearld Pies is a 37 y/o a PMH of triple + R breast cancer (stage IIIb with skin and lymph node involvement) . S/P bilateral mastectomies and XRT. S/P hysterectomy 2014.   In June 2013, the patient felt a new lump in the right breast, and brought this to the attention of [name] at [facility]. Found to have breast masses. Both these masses were biopsied 02/09/2012, and the final pathology ([id number]) showed both IIb invasive ductal carcinoma, with a prognostic panel (from the mass at 11:00) as follows: estrogen receptor positive at 90%, progesterone receptor positive at 90%, Mib-1 of 30%, and HER-2 amplification with a ratio of 4.29 by CISH. MRI of the breast was performed 02/15/2012 and showed the mass in the 10:00 position to measure 3.1 cm, the one at the 11:00 position to measure 3.1 cm. There was diffuse skin thickening in the right breast and 2 enhancing abnormal-appearing lower right axillary lymph nodes measuring 2.0 and 1.8 cm respectively. There was no evidence for internal mammary adenopathy, left axillary adenopathy, or significant findings in the left breast. Both lymph nodes and skin bx + for CA so staged at IIIb.   Echo: 02/23/12 EF 60% 06/2012 EF 55% lat s' 10.8 10/30/12: EF 55-60% lat s' 11.7 02/12/13 EF 45-50% lat s' 10.4  Started chemo July 10 with dose dense cycles of doxorubicin/cyclophosphamide being given in the neoadjuvant setting. S/P bilateral mastectomies and XRT.  Started on herceptin.  She returns for follow up.  Last week she complained to R arm tingling. Denies orthopnea, PND or edema. She continues to work full time. She continues on herceptin q 3 weeks until September 2014.  ROS: All systems negative except as listed in HPI, PMH and Problem List.  Past Medical History  Diagnosis Date  . Iron (Fe) deficiency anemia   . Asthma     exercise  induced  . Breast cancer   . Back pain   . Migraines   . Lactose intolerance   . Hyperlipidemia     Current Outpatient Prescriptions  Medication Sig Dispense Refill  . naproxen sodium (ANAPROX) 220 MG tablet Take 220 mg by mouth daily as needed.       . Probiotic Product (PROBIOTIC & ACIDOPHILUS EX ST) CAPS Take 1 capsule by mouth daily.       . tamoxifen (NOLVADEX) 20 MG tablet Take 1 tablet (20 mg total) by mouth daily.  90 tablet  6   No current facility-administered medications for this encounter.   Facility-Administered Medications Ordered in Other Encounters  Medication Dose Route Frequency Provider Last Rate Last Dose  . sodium chloride 0.9 % injection 10 mL  10 mL Intracatheter PRN Catalina Gravel, PA-C   10 mL at 11/01/12 1134     PHYSICAL EXAM: Filed Vitals:   02/12/13 1030  BP: 120/81  Pulse: 92  Height: 5\' 8"  (1.727 m)  Weight: 174 lb (78.926 kg)  SpO2: 100%    General:  Well appearing. No resp difficulty HEENT: normal Neck: supple. JVP flat. Carotids 2+ bilaterally; no bruits. No lymphadenopathy or thryomegaly appreciated. Cor: PMI normal. Regular rate & rhythm. No rubs, gallops or murmurs. Lungs: clear Abdomen: soft, nontender, nondistended. No hepatosplenomegaly. No bruits or masses. Good bowel sounds. Extremities: no cyanosis, clubbing, rash, edema Neuro: alert & orientedx3, cranial nerves grossly intact. Moves all 4 extremities w/o difficulty. Affect  pleasant.   ASSESSMENT & PLAN:

## 2013-02-12 NOTE — Progress Notes (Signed)
Echo Lab  2D Echocardiogram completed.  Milianna Ericsson L Adolphe Fortunato, RDCS 02/12/2013 10:23 AM

## 2013-02-12 NOTE — Patient Instructions (Addendum)
Follow up 4 weeks   Start carvedilol 3.125 mg twice a day  In 2 weeks take losartan 25 mg daily  Do the following things EVERYDAY: 1) Weigh yourself in the morning before breakfast. Write it down and keep it in a log. 2) Take your medicines as prescribed 3) Eat low salt foods-Limit salt (sodium) to 2000 mg per day.  4) Stay as active as you can everyday 5) Limit all fluids for the day to less than 2 liters

## 2013-02-12 NOTE — Assessment & Plan Note (Addendum)
Dr Gala Romney discussed and reviewed ECHO. Mildly reduced EF . Will need to stop Herceptin. Start carvedilol 3.125 mg twice a day. Reviewed most recent BMET 01/23/13 potassium ok. Will start losartan 25 mg daily in 2 weeks. Will need [facility].  Follow up in 4 weeks. Repeat ECHO in 6-8 weeks.    Patient seen and examined with Tonye Becket, NP. We discussed all aspects of the encounter. I agree with the assessment and plan as stated above. I have reviewed her echo personally in clinic and compared it to her previous studies. Her EF has declined to 45-50% however her lateral s' remains stable. 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 I discussed the situation with Dr. Darnelle Catalan by telephone. She has received 10 months of Herceptin to date and has 2 months more to go. Ee have made the decision to just go ahead and stop her Herceptin completely. She is quite happy about this. We'll start carvedilol 3.125 mg twice a day if she is tolerating this she will start losartan 25 mg daily 2 weeks after that. Will check her bmet in several weeks. We have scheduled her for repeat echo in about 2 months. She does a contact us should she develop any heart failure symptoms. However I suspect her EF will recover fairly quickly.

## 2013-02-13 ENCOUNTER — Telehealth: Payer: Self-pay | Admitting: *Deleted

## 2013-02-13 ENCOUNTER — Other Ambulatory Visit: Payer: Self-pay | Admitting: Oncology

## 2013-02-13 NOTE — Telephone Encounter (Signed)
sw pt informed her that her appts for 02/15/13 was canceled...td

## 2013-02-14 ENCOUNTER — Telehealth: Payer: Self-pay | Admitting: *Deleted

## 2013-02-14 ENCOUNTER — Telehealth (HOSPITAL_COMMUNITY): Payer: Self-pay | Admitting: Adult Health

## 2013-02-14 ENCOUNTER — Other Ambulatory Visit: Payer: Self-pay | Admitting: Physician Assistant

## 2013-02-14 DIAGNOSIS — C50411 Malignant neoplasm of upper-outer quadrant of right female breast: Secondary | ICD-10-CM

## 2013-02-14 MED ORDER — CARVEDILOL 3.125 MG PO TABS: 3.1250 mg | ORAL_TABLET | Freq: Every day | ORAL | Status: DC

## 2013-02-14 NOTE — Telephone Encounter (Signed)
Lm informing the pt that her appt for 03/08/13 was canceled. gv appt d/t for 03/15/13 labs@ 10:15am and ov @ 10:45.ibuprofen also made her aware that i will mail a letter/cal as well...td

## 2013-02-14 NOTE — Telephone Encounter (Signed)
She would like to hold off on starting carvedilol and losartan.   Per Magrinat. Plan to stop Herceptin with no plan to restart.   Will discuss with Dr Gala Romney. Will need to repeat ECHO in 4 weeks.   CLEGG,AMY 1:14 PM

## 2013-02-14 NOTE — Telephone Encounter (Signed)
Discussed medications with Dr Gala Romney regarding concerns voiced by Marijean Niemann regarding carvedilol and losartan.   Explained that we would like her to start carvedilol to prevent neurohormonal remodeling. She verbalized understanding and she is agreeable to carvedilol 3.125 mg at bed time. Ok to stop losartan.   Plan to repeat ECHO March 12, 2013 at 8:00. If EF is backup she will be able to stop carvedilol.   Coalton Arch NP-C 3:59 PM

## 2013-02-15 ENCOUNTER — Other Ambulatory Visit: Payer: Managed Care, Other (non HMO) | Admitting: Lab

## 2013-02-15 ENCOUNTER — Ambulatory Visit: Payer: Managed Care, Other (non HMO)

## 2013-02-26 ENCOUNTER — Ambulatory Visit: Payer: Managed Care, Other (non HMO) | Attending: Oncology | Admitting: Physical Therapy

## 2013-02-26 DIAGNOSIS — IMO0001 Reserved for inherently not codable concepts without codable children: Secondary | ICD-10-CM | POA: Insufficient documentation

## 2013-02-26 DIAGNOSIS — C50919 Malignant neoplasm of unspecified site of unspecified female breast: Secondary | ICD-10-CM | POA: Insufficient documentation

## 2013-03-04 ENCOUNTER — Other Ambulatory Visit: Payer: Self-pay | Admitting: Physician Assistant

## 2013-03-04 DIAGNOSIS — R232 Flushing: Secondary | ICD-10-CM

## 2013-03-04 DIAGNOSIS — C50411 Malignant neoplasm of upper-outer quadrant of right female breast: Secondary | ICD-10-CM

## 2013-03-08 ENCOUNTER — Other Ambulatory Visit: Payer: Managed Care, Other (non HMO) | Admitting: Lab

## 2013-03-08 ENCOUNTER — Ambulatory Visit: Payer: Managed Care, Other (non HMO) | Admitting: Physician Assistant

## 2013-03-08 ENCOUNTER — Ambulatory Visit: Payer: Managed Care, Other (non HMO)

## 2013-03-12 ENCOUNTER — Ambulatory Visit (HOSPITAL_BASED_OUTPATIENT_CLINIC_OR_DEPARTMENT_OTHER)
Admission: RE | Admit: 2013-03-12 | Discharge: 2013-03-12 | Disposition: A | Discharge status: Home / Self Care | Payer: Managed Care, Other (non HMO) | Source: Ambulatory Visit | Attending: Cardiology | Admitting: Cardiology

## 2013-03-12 ENCOUNTER — Ambulatory Visit (HOSPITAL_COMMUNITY)
Admission: RE | Admit: 2013-03-12 | Discharge: 2013-03-12 | Disposition: A | Discharge status: Home / Self Care | Payer: Managed Care, Other (non HMO) | Source: Ambulatory Visit | Attending: Cardiology | Admitting: Cardiology

## 2013-03-12 VITALS — BP 120/78 | HR 88 | Wt 178.8 lb

## 2013-03-12 DIAGNOSIS — Z90710 Acquired absence of both cervix and uterus: Secondary | ICD-10-CM | POA: Insufficient documentation

## 2013-03-12 DIAGNOSIS — Z09 Encounter for follow-up examination after completed treatment for conditions other than malignant neoplasm: Secondary | ICD-10-CM

## 2013-03-12 DIAGNOSIS — Z79899 Other long term (current) drug therapy: Secondary | ICD-10-CM | POA: Insufficient documentation

## 2013-03-12 DIAGNOSIS — E785 Hyperlipidemia, unspecified: Secondary | ICD-10-CM | POA: Insufficient documentation

## 2013-03-12 DIAGNOSIS — C50411 Malignant neoplasm of upper-outer quadrant of right female breast: Secondary | ICD-10-CM

## 2013-03-12 DIAGNOSIS — I428 Other cardiomyopathies: Secondary | ICD-10-CM | POA: Insufficient documentation

## 2013-03-12 DIAGNOSIS — Z170 Estrogen receptor positive status [ER+]: Secondary | ICD-10-CM | POA: Insufficient documentation

## 2013-03-12 DIAGNOSIS — C50419 Malignant neoplasm of upper-outer quadrant of unspecified female breast: Secondary | ICD-10-CM | POA: Insufficient documentation

## 2013-03-12 NOTE — Patient Instructions (Addendum)
Follow up as needed.    Stop carvedilol  

## 2013-03-12 NOTE — Progress Notes (Signed)
Patient ID: Jody Dunn, female   DOB: Jul 27, 1976, 37 y.o.   MRN: [id number] HPI: Referring Physician: Dr. Jearld Dunn is a 37 y/o a PMH of triple + R breast cancer (stage IIIb with skin and lymph node involvement) . S/P bilateral mastectomies and XRT. S/P hysterectomy 2014.   In June 2013, the patient felt a new lump in the right breast, and brought this to the attention of Jody Dunn at [facility]. Found to have breast masses. Both these masses were biopsied 02/09/2012, and the final pathology ([id number]) showed both IIb invasive ductal carcinoma, with a prognostic panel (from the mass at 11:00) as follows: estrogen receptor positive at 90%, progesterone receptor positive at 90%, Mib-1 of 30%, and HER-2 amplification with a ratio of 4.29 by CISH. MRI of the breast was performed 02/15/2012 and showed the mass in the 10:00 position to measure 3.1 cm, the one at the 11:00 position to measure 3.1 cm. There was diffuse skin thickening in the right breast and 2 enhancing abnormal-appearing lower right axillary lymph nodes measuring 2.0 and 1.8 cm respectively. There was no evidence for internal mammary adenopathy, left axillary adenopathy, or significant findings in the left breast. Both lymph nodes and skin bx + for CA so staged at IIIb.   Echo: 02/23/12 EF 60% 06/2012 EF 55% lat s' 10.8 10/30/12: EF 55-60% lat s' 11.7 02/12/13 EF 45-50% lat s' 10.4 03/12/13 : EF 55% lateral S' 10.4  Started chemo July 10 with dose dense cycles of doxorubicin/cyclophosphamide being given in the neoadjuvant setting. S/P bilateral mastectomies and XRT.  Started on herceptin.  She retruns for follow up. Last visit Herceptin stopped due mildly reduced EF. Started on 3.125 mg of carvedilol at bed time. Denies SOB/PND/Orthopnea. Works full time.      ROS: All systems negative except as listed in HPI, PMH and Problem List.  Past Medical History  Diagnosis Date  . Iron (Fe) deficiency anemia   . Asthma      exercise induced  . Breast cancer   . Back pain   . Migraines   . Lactose intolerance   . Hyperlipidemia     Current Outpatient Prescriptions  Medication Sig Dispense Refill  . carvedilol (COREG) 3.125 MG tablet Take 1 tablet (3.125 mg total) by mouth at bedtime.  30 tablet  1  . gabapentin (NEURONTIN) 300 MG capsule TAKE 1 CAPSULE BY MOUTH AT BEDTIME AS NEEDED FOR HOTFLASHES  30 capsule  3  . naproxen sodium (ANAPROX) 220 MG tablet Take 220 mg by mouth daily as needed.       . Probiotic Product (PROBIOTIC & ACIDOPHILUS EX ST) CAPS Take 1 capsule by mouth daily.       . tamoxifen (NOLVADEX) 20 MG tablet Take 1 tablet (20 mg total) by mouth daily.  90 tablet  6   No current facility-administered medications for this encounter.   Facility-Administered Medications Ordered in Other Encounters  Medication Dose Route Frequency Provider Last Rate Last Dose  . sodium chloride 0.9 % injection 10 mL  10 mL Intracatheter PRN Jody Gravel, PA-C   10 mL at 11/01/12 1134     PHYSICAL EXAM: Filed Vitals:   03/12/13 0855  BP: 120/78  Pulse: 88  Weight: 178 lb 12.8 oz (81.103 kg)  SpO2: 100%    General:  Well appearing. No resp difficulty HEENT: normal Neck: supple. JVP flat. Carotids 2+ bilaterally; no bruits. No lymphadenopathy or thryomegaly appreciated. Cor: PMI normal. Regular  rate & rhythm. No rubs, gallops or murmurs. Lungs: clear Abdomen: soft, nontender, nondistended. No hepatosplenomegaly. No bruits or masses. Good bowel sounds. Extremities: no cyanosis, clubbing, rash, edema Neuro: alert & orientedx3, cranial nerves grossly intact. Moves all 4 extremities w/o difficulty. Affect pleasant.   ASSESSMENT & PLAN: Cancer of Upper Outer R Breast-   Herceptin stopped at last visit due to mildly reduced EF of 45-50%.   I reviewed echo today, EF 55% with lateral s' 10.4 cm/sec (some improvement, EF now normal).  Can stop carvedilol today.  No further Herceptin.   Follow up as needed.    Jody Dunn 03/12/2013 9:33 AM

## 2013-03-12 NOTE — Progress Notes (Signed)
Echo Lab  2D Echocardiogram completed.  Jody Dunn, RDCS 03/12/2013 8:56 AM

## 2013-03-13 ENCOUNTER — Encounter (HOSPITAL_COMMUNITY): Payer: Managed Care, Other (non HMO)

## 2013-03-15 ENCOUNTER — Encounter: Payer: Self-pay | Admitting: Physician Assistant

## 2013-03-15 ENCOUNTER — Telehealth: Payer: Self-pay | Admitting: Oncology

## 2013-03-15 ENCOUNTER — Other Ambulatory Visit (HOSPITAL_BASED_OUTPATIENT_CLINIC_OR_DEPARTMENT_OTHER): Payer: Managed Care, Other (non HMO) | Admitting: Lab

## 2013-03-15 ENCOUNTER — Ambulatory Visit (HOSPITAL_BASED_OUTPATIENT_CLINIC_OR_DEPARTMENT_OTHER): Payer: Managed Care, Other (non HMO) | Admitting: Physician Assistant

## 2013-03-15 VITALS — BP 126/77 | HR 81 | Temp 99.4°F | Resp 20 | Ht 68.0 in | Wt 179.1 lb

## 2013-03-15 DIAGNOSIS — C50411 Malignant neoplasm of upper-outer quadrant of right female breast: Secondary | ICD-10-CM

## 2013-03-15 DIAGNOSIS — C50419 Malignant neoplasm of upper-outer quadrant of unspecified female breast: Secondary | ICD-10-CM

## 2013-03-15 LAB — COMPREHENSIVE METABOLIC PANEL (CC13)
AST: 24 U/L (ref 5–34)
Albumin: 3.7 g/dL (ref 3.5–5.0)
Alkaline Phosphatase: 62 U/L (ref 40–150)
BUN: 15.6 mg/dL (ref 7.0–26.0)
Creatinine: 1 mg/dL (ref 0.6–1.1)
Glucose: 104 mg/dl (ref 70–140)
Total Bilirubin: 0.28 mg/dL (ref 0.20–1.20)

## 2013-03-15 LAB — CBC WITH DIFFERENTIAL/PLATELET
Basophils Absolute: 0.1 10*3/uL (ref 0.0–0.1)
EOS%: 2 % (ref 0.0–7.0)
Eosinophils Absolute: 0.1 10*3/uL (ref 0.0–0.5)
HGB: 12.6 g/dL (ref 11.6–15.9)
LYMPH%: 25.6 % (ref 14.0–49.7)
MCH: 31.4 pg (ref 25.1–34.0)
MCV: 92.5 fL (ref 79.5–101.0)
MONO%: 6 % (ref 0.0–14.0)
NEUT#: 4 10*3/uL (ref 1.5–6.5)
Platelets: 251 10*3/uL (ref 145–400)

## 2013-03-15 MED ORDER — TAMOXIFEN CITRATE 20 MG PO TABS: 20.0000 mg | ORAL_TABLET | Freq: Every day | ORAL | Status: DC

## 2013-03-15 NOTE — Telephone Encounter (Signed)
gv pt appt schedule for October and appt w/Dr. Donell Beers for 8/26. Per 7/24 appt w/Dr. Donell Beers early September. Due to pt day/time constraints and office availability appt was scheduled for 8/26 per pt request.

## 2013-03-15 NOTE — Progress Notes (Signed)
ID: Jody Dunn   DOB: 04-29-1976  MR#: [id number]  NWG#:[id number]  PCP: [name], CNM GYN: Olivia Mackie SU: Abigail Miyamoto OTHER MD: Genelle Bal, Brigid Re  HISTORY OF PRESENT ILLNESS: Jody Dunn had a mammogram at [facility] of 2010 because of shooting pains in the right breast. This found no worrisome finding. More recently, in the spring of 2012, the patient at delivered her second child. She nursed but the child refused to take milk from the right breast. The patient tells me that she has been able to palpate easily from the right breast, so production is not an issue. More recently, early June 2013, the patient felt a new lump in the right breast, and brought this to the attention of [name] at [facility]. The patient had repeat mammography at [facility] 02/07/2012. This found at the breast tissue to be heterogeneously dense, which is not a surprise given that the patient is lactating. There was diffuse skin thickening. Erythema is not described. The nipple was retracted. There was no definitive peau d'orange appearance. Pleomorphic calcifications in the lateral right breast extended approximately 11 cm, without definite mass. On ultrasound there was a vague irregular hypoechoic mass measuring approximately 15 mm in the right breast, with a second ill-defined hypoechoic lobulated mass measuring approximately 17 mm. Both these masses were biopsied 02/09/2012, and the final pathology ([id number]) showed both IIb invasive ductal carcinoma, with a prognostic panel (from the mass at 11:00) as follows: estrogen receptor positive at 90%, progesterone receptor positive at 90%, Mib-1 of 30%, and HER-2 amplification with a ratio of 4.29 by CISH. MRI of the breast was performed 02/15/2012 and showed the mass in the 10:00 position to measure 3.1 cm, the one at the 11:00 position to measure 3.1 cm. There was diffuse skin thickening in the right breast and 2 enhancing abnormal-appearing lower  right axillary lymph nodes measuring 2.0 and 1.8 cm respectively. There was no evidence for internal mammary adenopathy, left axillary adenopathy, or significant findings in the left breast.  INTERVAL HISTORY: Jody Dunn returns today for followup of her right breast cancer.  Since her last appointment here in may, [name] had her last dose of trastuzumab on June 5. Subsequently, an echocardiogram on 02/12/2013 showed a mildly reduced ejection fraction of 45-50%, and trastuzumab was discontinued. A repeat echocardiogram on 03/12/2013 showed improvement in the ejection fraction, back to 55%. The decision was made not to resume trastuzumab since she had completed in 11 months of therapy.   She continues on tamoxifen with good tolerance. She is having hot flashes. She's had no vaginal bleeding. She denies any vaginal discharge or dryness. She has some mildly blurred vision and recently got new glasses.  REVIEW OF SYSTEMS: Jody Dunn has had no recent illnesses and denies fevers or chills. Her energy level is good. She continues to exercise on a regular basis. She's eating and drinking well and has no nausea or change in bowel or bladder habits. She denies any cough, shortness of breath, orthopnea, or chest pain. She's had no abnormal headaches or dizziness. She continues to complain of some pain in the left foot where her dog recently stepped on her. A plain film study of the left foot was unremarkable. Her great toes hurt at times. She has some "puffiness" in the right hand. She's gained a little weight. She bruises easily, but has had no abnormal bleeding.  A detailed review of systems is otherwise stable and noncontributory.   PAST MEDICAL HISTORY: Past Medical  History  Diagnosis Date  . Iron (Fe) deficiency anemia   . Asthma     exercise induced  . Breast cancer   . Back pain   . Migraines   . Lactose intolerance   . Hyperlipidemia     PAST SURGICAL HISTORY: Past Surgical History  Procedure  Laterality Date  . Appendectomy    . Shoulder surgery    . Knee surgery      X2  . Portacath placement  02/21/2012    Procedure: INSERTION PORT-A-CATH;  Surgeon: Almond Lint, MD;  Location: [facility];  Service: General;  Laterality: N/A;  . Skin biopsy  02/21/2012    Procedure: BIOPSY SKIN;  Surgeon: Almond Lint, MD;  Location: [facility];  Service: General;  Laterality: Right;  . Breast biopsy  02/09/2012  . Mastectomy modified radical  06/28/2012    Procedure: MASTECTOMY MODIFIED RADICAL;  Surgeon: Almond Lint, MD;  Location: [facility];  Service: General;  Laterality: Right;  Right Modified Radical Mastectomy and Left Simple Mastectomy  . Simple mastectomy with axillary sentinel node biopsy  06/28/2012    Procedure: SIMPLE MASTECTOMY;  Surgeon: Almond Lint, MD;  Location: [facility];  Service: General;  Laterality: Left;  . Robotic assisted total hysterectomy with bilateral salpingo oopherectomy N/A 11/13/2012    Procedure: ROBOTIC ASSISTED TOTAL HYSTERECTOMY WITH BILATERAL SALPINGO OOPHORECTOMY;  Surgeon: Lenoard Aden, MD;  Location: [facility];  Service: Gynecology;  Laterality: N/A;    FAMILY HISTORY Family History  Problem Relation Age of Onset  . Cancer Paternal Aunt 42    Breast cancer (half sibling, related through father)  . Spina bifida Paternal Uncle 0    died around 27-73 months of age  . Cancer Maternal Grandmother 89    colon cancer  . Dementia Maternal Grandfather   . Cancer Paternal Grandmother     breast cancer between 62-42  . Cancer Paternal Grandfather     brain cancer  . Cancer Other     Paternal grandmother's siblings had breast cancer and pancreatic cancer  . Cancer Other 43    maternal great grandmother with breast cancer   the patient's parents are alive, in their early 56s. The patient is an only child. There is a significant family history of cancer including breast, brain, and colon, detailed in our genetic counselors report. BRCA and P. 53 testing is  negative  GYNECOLOGIC HISTORY: Menarche age 89; she is GX P2, first live birth age 84.  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 Her husband [name]. works as a Furniture conservator/restorer for [employer]. Their children are [name] (2008) and [name] (2012).   ADVANCED DIRECTIVES: not in place  HEALTH MAINTENANCE: History  Substance Use Topics  . Smoking status: Never Smoker   . Smokeless tobacco: Never Used  . Alcohol Use: 2.5 - 3.5 oz/week    5-7 drink(s) per week     Comment: OCCASIONAL     Colonoscopy: no  PAP: 2012  Bone density: no  Lipid panel:  Allergies  Allergen Reactions  . Zofran (Ondansetron Hcl) Nausea And Vomiting and Other (See Comments)    Migraine headaches and vomiting  . Oxycodone Itching and Rash  . Antiseptic Products, Misc.     duraprep-rash  . Duraprep (Antiseptic Products, Misc.) Hives and Rash  . Penicillins Hives and Nausea  And Vomiting    vomiting    Current Outpatient Prescriptions  Medication Sig Dispense Refill  . naproxen sodium (ANAPROX) 220 MG tablet Take 220 mg by mouth daily as needed.       . Probiotic Product (PROBIOTIC & ACIDOPHILUS EX ST) CAPS Take 1 capsule by mouth daily.       . tamoxifen (NOLVADEX) 20 MG tablet Take 1 tablet (20 mg total) by mouth daily.  90 tablet  3  . gabapentin (NEURONTIN) 300 MG capsule TAKE 1 CAPSULE BY MOUTH AT BEDTIME AS NEEDED FOR HOTFLASHES  30 capsule  3   No current facility-administered medications for this visit.   Facility-Administered Medications Ordered in Other Visits  Medication Dose Route Frequency Provider Last Rate Last Dose  . sodium chloride 0.9 % injection 10 mL  10 mL Intracatheter PRN Deairra Halleck Allegra Grana, PA-C   10 mL at 11/01/12 1134    OBJECTIVE: Young white woman who appears well Filed Vitals:   03/15/13 1018  BP: 126/77   Pulse: 81  Temp: 99.4 F (37.4 C)  Resp: 20  ECOG:  0 Filed Weights   03/15/13 1018  Weight: 179 lb 1.6 oz (81.239 kg)    Sclerae unicteric Oropharynx clear No cervical or supraclavicular adenopathy Lungs clear to auscultation, no wheezes, no rales or rhonchi Heart regular rate and rhythm, no murmur appreciated Abdomen is soft, nontender, with positive bowel sounds MSK no focal spinal tenderness, no peripheral edema Neuro: nonfocal, well oriented with positive affect Breasts: She status post bilateral mastectomies. No evidence of local recurrence.  Axillae are benign bilaterally the palpable adenopathy. Port is intact in the left upper chest wall, no erythema or edema, and no evidence of infection.   LAB RESULTS: Lab Results  Component Value Date   WBC 6.2 03/15/2013   NEUTROABS 4.0 03/15/2013   HGB 12.6 03/15/2013   HCT 37.2 03/15/2013   MCV 92.5 03/15/2013   PLT 251 03/15/2013       Chemistry      Component Value Date/Time   NA 139 03/15/2013 0959   NA 137 11/10/2012 1444   K 4.2 03/15/2013 0959   K 3.7 11/10/2012 1444   CL 105 01/25/2013 0910   CL 97 11/10/2012 1444   CO2 24 03/15/2013 0959   CO2 26 11/10/2012 1444   BUN 15.6 03/15/2013 0959   BUN 15 11/10/2012 1444   CREATININE 1.0 03/15/2013 0959   CREATININE 0.95 11/10/2012 1444      Component Value Date/Time   CALCIUM 9.0 03/15/2013 0959   CALCIUM 9.7 11/10/2012 1444   ALKPHOS 62 03/15/2013 0959   ALKPHOS 94 11/10/2012 1444   AST 24 03/15/2013 0959   AST 22 11/10/2012 1444   ALT 23 03/15/2013 0959   ALT 18 11/10/2012 1444   BILITOT 0.28 03/15/2013 0959   BILITOT 0.3 11/10/2012 1444       Lab Results  Component Value Date   LABCA2 28 02/16/2012     STUDIES:  Echocardiogram on 03/12/2013 shows an ejection fraction of 55 %.    ASSESSMENT: 37 y.o. BRCA-negative [place] woman   (1)  status post right breast biopsy 02/09/2012 for multifocal pT4 cN1, stage IIIB invasive ductal carcinoma. Prognostic profile from  one of the 2 masses shows it to be triple positive with an MIB-1 of 30%.   (2)  s/p 4 dose dense cycles of doxorubicin/ cyclophosphamide, followed by 4 cycles of paclitaxel, dose-dense,completed 06/07/2012, given with trastuzumab   (3) trastuzumab given every  3 weeks through early June 2014, discontinued due to a drop in the ejection fraction which has recovered. Most recent echo 03/12/2013.  (4) s/p Right modified radical mastectomy (with prophylactic left simple mastectomy) 06/28/2012, showing a residual 2.3 cm invasive ductal carcinoma, grade 3, with 0 of 14 lymph nodes involved.  (5)  Received radiation in [place] under the care of Dr. Genelle Bal, beginning January 2014 and completed in late February 2014  (6)  Started tamoxifen March 2014  (7)  status post total hysterectomy with bilateral salpingo-oophorectomy under the care of Dr. Shelly Rubenstein, 11/13/2012.   PLAN:  Tomica appears to be doing well with regards to her breast cancer, and will continue on tamoxifen at 20 mg daily. She will see Dr. Donell Beers for routine followup in early September, prior to her scheduled reconstruction in [place] on September 23. We will see her back here for a routine three-month followup in late October. If she is still doing well at that time, we will likely extend followup to every 6 months.  Patient voices understanding and agreement with our plan, and will call with any changes or problems.   Massa Pe    03/15/2013

## 2013-03-27 ENCOUNTER — Ambulatory Visit: Payer: Self-pay | Admitting: Radiation Oncology

## 2013-04-17 ENCOUNTER — Other Ambulatory Visit (INDEPENDENT_AMBULATORY_CARE_PROVIDER_SITE_OTHER): Payer: Self-pay | Admitting: General Surgery

## 2013-04-17 ENCOUNTER — Ambulatory Visit (INDEPENDENT_AMBULATORY_CARE_PROVIDER_SITE_OTHER): Payer: Managed Care, Other (non HMO) | Admitting: General Surgery

## 2013-04-17 ENCOUNTER — Telehealth (INDEPENDENT_AMBULATORY_CARE_PROVIDER_SITE_OTHER): Payer: Self-pay

## 2013-04-17 ENCOUNTER — Encounter (INDEPENDENT_AMBULATORY_CARE_PROVIDER_SITE_OTHER): Payer: Self-pay | Admitting: General Surgery

## 2013-04-17 VITALS — BP 130/82 | HR 78 | Resp 14 | Ht 68.0 in | Wt 178.0 lb

## 2013-04-17 DIAGNOSIS — C50411 Malignant neoplasm of upper-outer quadrant of right female breast: Secondary | ICD-10-CM

## 2013-04-17 DIAGNOSIS — C50419 Malignant neoplasm of upper-outer quadrant of unspecified female breast: Secondary | ICD-10-CM

## 2013-04-17 DIAGNOSIS — R2231 Localized swelling, mass and lump, right upper limb: Secondary | ICD-10-CM

## 2013-04-17 DIAGNOSIS — Z853 Personal history of malignant neoplasm of breast: Secondary | ICD-10-CM

## 2013-04-17 NOTE — Assessment & Plan Note (Signed)
Ordering imaging of right axilla.  Reconstruction planned at [facility] with Dr. Delora Fuel 9/23 with DIEP flaps bilaterally.  Would like to get nodule in right axilla evaluated completely before then.    If negative, follow up in 2 months, if concerning, will order biopsy.

## 2013-04-17 NOTE — Patient Instructions (Signed)
Will get imaging of right axilla to evaluate thickening.    Assuming this is negative, I want to repeat the exam in 2 months.    If it demonstrates something concerning, we will get biopsy.

## 2013-04-17 NOTE — Telephone Encounter (Signed)
Notified the patient her [place] left axilla is scheduled for 1:30 pm tomorrow at [facility].  She will make every effort to get there at that time.

## 2013-04-17 NOTE — Progress Notes (Signed)
HISTORY: Patient is now almost 9 months status post bilateral mastectomy with right axillary lymph node dissection.  She had to d/c herceptin due to CHF.  Dr. Delora Fuel palpated mass in right axilla.she is also having some tightness of the right axilla and arm.  She is set up for bilateral DIEP flaps.     PERTINENT REVIEW OF SYSTEMS: Otherwise negative.     EXAM: Head: Normocephalic and atraumatic.  Eyes:  Conjunctivae are normal. Pupils are equal, round, and reactive to light. No scleral icterus.  Neck:  Normal range of motion. Neck supple. No tracheal deviation present. No thyromegaly present.  Resp: No respiratory distress, normal effort. Chest wall:  Small 1.5 cm nodule in posterior right axilla.  Remainder of chest wall negative.     Abd:  Abdomen is soft, non distended and non tender. No masses are palpable.  There is no rebound and no guarding.  Neurological: Alert and oriented to person, place, and time. Coordination normal.  Skin: Skin is warm and dry. No rash noted. No diaphoretic. No erythema. No pallor.  Psychiatric: Normal mood and affect. Normal behavior. Judgment and thought content normal.      ASSESSMENT AND PLAN:   Breast cancer of upper-outer quadrant of right female breast Ordering imaging of right axilla.  Reconstruction planned at [place] [facility] with Dr. Delora Fuel 9/23 with DIEP flaps bilaterally.  Would like to get nodule in right axilla evaluated completely before then.    If negative, follow up in 2 months, if concerning, will order biopsy.         Maudry Diego, MD Surgical Oncology, General & Endocrine Surgery [facility]  [name], CNM [place], [place]

## 2013-04-20 ENCOUNTER — Telehealth (INDEPENDENT_AMBULATORY_CARE_PROVIDER_SITE_OTHER): Payer: Self-pay

## 2013-04-20 NOTE — Telephone Encounter (Signed)
Pt req.u/s results please. Pls call her.

## 2013-04-20 NOTE — Telephone Encounter (Signed)
Patient is asking for [place] results . Advised  Results are not available at this time.please call as soon as it  comes in

## 2013-04-23 ENCOUNTER — Ambulatory Visit: Payer: Self-pay | Admitting: Radiation Oncology

## 2013-04-24 ENCOUNTER — Inpatient Hospital Stay: Admission: RE | Admit: 2013-04-24 | Payer: Managed Care, Other (non HMO) | Source: Ambulatory Visit

## 2013-04-24 ENCOUNTER — Other Ambulatory Visit (INDEPENDENT_AMBULATORY_CARE_PROVIDER_SITE_OTHER): Payer: Self-pay | Admitting: General Surgery

## 2013-04-24 ENCOUNTER — Telehealth (INDEPENDENT_AMBULATORY_CARE_PROVIDER_SITE_OTHER): Payer: Self-pay | Admitting: General Surgery

## 2013-04-24 ENCOUNTER — Ambulatory Visit
Admission: RE | Admit: 2013-04-24 | Discharge: 2013-04-24 | Disposition: A | Discharge status: Home / Self Care | Payer: Managed Care, Other (non HMO) | Source: Ambulatory Visit | Attending: General Surgery | Admitting: General Surgery

## 2013-04-24 DIAGNOSIS — C50911 Malignant neoplasm of unspecified site of right female breast: Secondary | ICD-10-CM

## 2013-04-24 MED ORDER — IOHEXOL 350 MG/ML SOLN
125.0000 mL | Freq: Once | INTRAVENOUS | Status: AC | PRN
  Administered 2013-04-24: 125 mL via INTRAVENOUS

## 2013-04-24 NOTE — Telephone Encounter (Signed)
CT of the chest with contrast scheduled for walk-in today at [facility].  Pt is aware.  We will call her after results are available.

## 2013-04-24 NOTE — Telephone Encounter (Signed)
Pt called to report she is having "stabbing pain in the chest wall, especially when taking a deep breath."  Discussed with Dr. Donell Beers; [facility] report located and reviewed.  Pt informed the U/S was negative, only identifying scar tissue. Pt states she first noted this stabbing pain over the weekend but was out of town and did not want to go to an Urgent Care since all her records were here.  Pt specifically identified the area on her Rt side, just below her masty scar as the area of pain.  She is aware she is at risk for PE.  Pt states she has been taking NSAIDs for discomfort, but they have not been effective for this.

## 2013-04-24 NOTE — Telephone Encounter (Signed)
Patient called back again and wanted to know if you have her CT scan schedule yet. I walked back to pod 2 and see if it was done, I told the patient that you was working on it. Please call her once you it schedule

## 2013-04-24 NOTE — Telephone Encounter (Signed)
Pt made aware no PE and no signs of recurrent cancer.  Radiation changes only.  Explained that this is not uncommon and will just take time to resolve.  Pt understood and was appreciative.

## 2013-05-17 ENCOUNTER — Encounter (INDEPENDENT_AMBULATORY_CARE_PROVIDER_SITE_OTHER): Payer: Self-pay

## 2013-06-06 ENCOUNTER — Encounter (INDEPENDENT_AMBULATORY_CARE_PROVIDER_SITE_OTHER): Payer: Self-pay

## 2013-06-06 ENCOUNTER — Other Ambulatory Visit (HOSPITAL_BASED_OUTPATIENT_CLINIC_OR_DEPARTMENT_OTHER): Payer: Managed Care, Other (non HMO) | Admitting: Lab

## 2013-06-06 DIAGNOSIS — C50419 Malignant neoplasm of upper-outer quadrant of unspecified female breast: Secondary | ICD-10-CM

## 2013-06-06 DIAGNOSIS — C50411 Malignant neoplasm of upper-outer quadrant of right female breast: Secondary | ICD-10-CM

## 2013-06-06 LAB — CBC WITH DIFFERENTIAL/PLATELET
Basophils Absolute: 0.1 10*3/uL (ref 0.0–0.1)
Eosinophils Absolute: 0.1 10*3/uL (ref 0.0–0.5)
HCT: 33 % — ABNORMAL LOW (ref 34.8–46.6)
HGB: 11.2 g/dL — ABNORMAL LOW (ref 11.6–15.9)
MCV: 88.8 fL (ref 79.5–101.0)
MONO%: 7 % (ref 0.0–14.0)
NEUT#: 5.1 10*3/uL (ref 1.5–6.5)
NEUT%: 71.1 % (ref 38.4–76.8)
Platelets: 407 10*3/uL — ABNORMAL HIGH (ref 145–400)
RDW: 13.1 % (ref 11.2–14.5)

## 2013-06-06 LAB — COMPREHENSIVE METABOLIC PANEL (CC13)
Albumin: 3.3 g/dL — ABNORMAL LOW (ref 3.5–5.0)
Alkaline Phosphatase: 66 U/L (ref 40–150)
BUN: 9.1 mg/dL (ref 7.0–26.0)
Calcium: 9.2 mg/dL (ref 8.4–10.4)
Creatinine: 0.8 mg/dL (ref 0.6–1.1)
Glucose: 119 mg/dl (ref 70–140)
Potassium: 4.2 mEq/L (ref 3.5–5.1)

## 2013-06-07 ENCOUNTER — Other Ambulatory Visit: Payer: Managed Care, Other (non HMO)

## 2013-06-14 ENCOUNTER — Ambulatory Visit (HOSPITAL_BASED_OUTPATIENT_CLINIC_OR_DEPARTMENT_OTHER): Payer: Managed Care, Other (non HMO) | Admitting: Oncology

## 2013-06-14 ENCOUNTER — Telehealth: Payer: Self-pay | Admitting: Oncology

## 2013-06-14 VITALS — BP 108/70 | HR 108 | Temp 98.5°F | Resp 18 | Ht 68.0 in | Wt 166.9 lb

## 2013-06-14 DIAGNOSIS — C50419 Malignant neoplasm of upper-outer quadrant of unspecified female breast: Secondary | ICD-10-CM

## 2013-06-14 DIAGNOSIS — Z170 Estrogen receptor positive status [ER+]: Secondary | ICD-10-CM

## 2013-06-14 DIAGNOSIS — C50411 Malignant neoplasm of upper-outer quadrant of right female breast: Secondary | ICD-10-CM

## 2013-06-14 NOTE — Progress Notes (Signed)
ID: Jody Dunn   DOB: June 14, 1976  MR#: [id number]  NWG#:[id number]  PCP: [name], CNM GYN: Olivia Mackie SU: Abigail Miyamoto OTHER MD: Genelle Bal, Brigid Re  HISTORY OF PRESENT ILLNESS: Madell had a mammogram at [facility] of 2010 because of shooting pains in the right breast. This found no worrisome finding. More recently, in the spring of 2012, the patient at delivered her second child. She nursed but the child refused to take milk from the right breast. The patient tells me that she has been able to palpate easily from the right breast, so production is not an issue. More recently, early June 2013, the patient felt a new lump in the right breast, and brought this to the attention of [name] at [facility]. The patient had repeat mammography at [facility] 02/07/2012. This found at the breast tissue to be heterogeneously dense, which is not a surprise given that the patient is lactating. There was diffuse skin thickening. Erythema is not described. The nipple was retracted. There was no definitive peau d'orange appearance. Pleomorphic calcifications in the lateral right breast extended approximately 11 cm, without definite mass. On ultrasound there was a vague irregular hypoechoic mass measuring approximately 15 mm in the right breast, with a second ill-defined hypoechoic lobulated mass measuring approximately 17 mm. Both these masses were biopsied 02/09/2012, and the final pathology ([id number]) showed both IIb invasive ductal carcinoma, with a prognostic panel (from the mass at 11:00) as follows: estrogen receptor positive at 90%, progesterone receptor positive at 90%, Mib-1 of 30%, and HER-2 amplification with a ratio of 4.29 by CISH. MRI of the breast was performed 02/15/2012 and showed the mass in the 10:00 position to measure 3.1 cm, the one at the 11:00 position to measure 3.1 cm. There was diffuse skin thickening in the right breast and 2 enhancing abnormal-appearing lower  right axillary lymph nodes measuring 2.0 and 1.8 cm respectively. There was no evidence for internal mammary adenopathy, left axillary adenopathy, or significant findings in the left breast.  INTERVAL HISTORY: Eyvette returns today for followup of her right breast cancer. The interval history is generally unremarkable. She continues to work on her reconstruction under Dr. Delora Fuel at [facility] and so far is pleased with the results.   REVIEW OF SYSTEMS: Kameah sleeps poorly, tends to get up and 6 in the morning, but goes to bed sometimes as early as 8 PM. It isn't exactly hot flashes that we dropped, ordered nocturia. It is more anxiety" thinking about things. The only place 2 Hz is her right big toe. She is sore sometimes in some of her surgical sites, but this is not constant. She has had no fevers or bleeding. She describes herself is anxious, but not depressed. She is not exercising regularly. A detailed review of systems is otherwise noncontributory  PAST MEDICAL HISTORY: Past Medical History  Diagnosis Date  . Iron (Fe) deficiency anemia   . Asthma     exercise induced  . Breast cancer   . Back pain   . Migraines   . Lactose intolerance   . Hyperlipidemia     PAST SURGICAL HISTORY: Past Surgical History  Procedure Laterality Date  . Appendectomy    . Shoulder surgery    . Knee surgery      X2  . Portacath placement  02/21/2012    Procedure: INSERTION PORT-A-CATH;  Surgeon: Almond Lint, MD;  Location: [facility];  Service: General;  Laterality: N/A;  . Skin biopsy  02/21/2012  Procedure: BIOPSY SKIN;  Surgeon: Almond Lint, MD;  Location: [facility];  Service: General;  Laterality: Right;  . Breast biopsy  02/09/2012  . Mastectomy modified radical  06/28/2012    Procedure: MASTECTOMY MODIFIED RADICAL;  Surgeon: Almond Lint, MD;  Location: [facility];  Service: General;  Laterality: Right;  Right Modified Radical Mastectomy and Left Simple Mastectomy  . Simple mastectomy with axillary sentinel node  biopsy  06/28/2012    Procedure: SIMPLE MASTECTOMY;  Surgeon: Almond Lint, MD;  Location: [facility];  Service: General;  Laterality: Left;  . Robotic assisted total hysterectomy with bilateral salpingo oopherectomy N/A 11/13/2012    Procedure: ROBOTIC ASSISTED TOTAL HYSTERECTOMY WITH BILATERAL SALPINGO OOPHORECTOMY;  Surgeon: Lenoard Aden, MD;  Location: [facility];  Service: Gynecology;  Laterality: N/A;  . Abdominal hysterectomy      FAMILY HISTORY Family History  Problem Relation Age of Onset  . Cancer Paternal Aunt 60    Breast cancer (half sibling, related through father)  . Spina bifida Paternal Uncle 0    died around 52-34 months of age  . Cancer Maternal Grandmother 23    colon cancer  . Dementia Maternal Grandfather   . Cancer Paternal Grandmother     breast cancer between 39-42  . Cancer Paternal Grandfather     brain cancer  . Cancer Other     Paternal grandmother's siblings had breast cancer and pancreatic cancer  . Cancer Other 66    maternal great grandmother with breast cancer   the patient's parents are alive, in their early 75s. The patient is an only child. There is a significant family history of cancer including breast, brain, and colon, detailed in our genetic counselors report. BRCA and P. 53 testing is negative  GYNECOLOGIC HISTORY: Menarche age 88; she is GX P2, first live birth age 60.  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 Her husband [name]. works as a Furniture conservator/restorer for [employer]. Their children are [name] (2008) and [name] (2012).   ADVANCED DIRECTIVES: not in place  HEALTH MAINTENANCE: History  Substance Use Topics  . Smoking status: Never Smoker   . Smokeless tobacco: Never Used  . Alcohol Use: 2.5 - 3.5 oz/week    5-7 drink(s) per week     Comment: OCCASIONAL      Colonoscopy: no  PAP: 2012  Bone density: no  Lipid panel:  Allergies  Allergen Reactions  . Zofran [Ondansetron Hcl] Nausea And Vomiting and Other (See Comments)    Migraine headaches and vomiting  . Oxycodone Itching and Rash  . Antiseptic Products, Misc.     duraprep-rash  . Duraprep [employer], Misc.] Hives and Rash  . Penicillins Hives and Nausea And Vomiting    vomiting    Current Outpatient Prescriptions  Medication Sig Dispense Refill  . naproxen sodium (ANAPROX) 220 MG tablet Take 220 mg by mouth daily as needed.       . Probiotic Product (PROBIOTIC & ACIDOPHILUS EX ST) CAPS Take 1 capsule by mouth daily.       . tamoxifen (NOLVADEX) 20 MG tablet Take 1 tablet (20 mg total) by mouth daily.  90 tablet  3   No current facility-administered medications for this visit.   Facility-Administered Medications Ordered in Other Visits  Medication Dose Route Frequency Provider Last Rate Last Dose  .  sodium chloride 0.9 % injection 10 mL  10 mL Intracatheter PRN Amy Allegra Grana, PA-C   10 mL at 11/01/12 1134    OBJECTIVE: Young white woman in no acute distress Filed Vitals:   06/14/13 1157  BP: 108/70  Pulse: 108  Temp: 98.5 F (36.9 C)  Resp: 18  ECOG:  0 Filed Weights   06/14/13 1157  Weight: 166 lb 14.4 oz (75.705 kg)    Sclerae unicteric Oropharynx clear No cervical or supraclavicular adenopathy Lungs clear to auscultation, no wheezes, no rales or rhonchi Heart regular rate and rhythm, no murmur appreciated Abdomen is soft, nontender, with positive bowel sounds MSK no focal spinal tenderness, no upper extremity lymphedema Neuro: nonfocal, well oriented, appropriate affect Breasts: She status post bilateral mastectomies with DIEP reconstruction. The cosmetic result is good. There is no evidence of local recurrence. Both axillae are benign.   LAB RESULTS: Lab Results  Component Value Date   WBC 7.2 06/06/2013   NEUTROABS 5.1 06/06/2013   HGB 11.2*  06/06/2013   HCT 33.0* 06/06/2013   MCV 88.8 06/06/2013   PLT 407* 06/06/2013       Chemistry      Component Value Date/Time   NA 142 06/06/2013 1038   NA 137 11/10/2012 1444   K 4.2 06/06/2013 1038   K 3.7 11/10/2012 1444   CL 105 01/25/2013 0910   CL 97 11/10/2012 1444   CO2 26 06/06/2013 1038   CO2 26 11/10/2012 1444   BUN 9.1 06/06/2013 1038   BUN 15 11/10/2012 1444   CREATININE 0.8 06/06/2013 1038   CREATININE 0.95 11/10/2012 1444      Component Value Date/Time   CALCIUM 9.2 06/06/2013 1038   CALCIUM 9.7 11/10/2012 1444   ALKPHOS 66 06/06/2013 1038   ALKPHOS 94 11/10/2012 1444   AST 16 06/06/2013 1038   AST 22 11/10/2012 1444   ALT 14 06/06/2013 1038   ALT 18 11/10/2012 1444   BILITOT <0.20 06/06/2013 1038   BILITOT 0.3 11/10/2012 1444       Lab Results  Component Value Date   LABCA2 28 02/16/2012     STUDIES: No results found.  ASSESSMENT: 37 y.o. BRCA-negative [place] woman   (1)  status post right breast biopsy 02/09/2012 for multifocal pT4 cN1, stage IIIB invasive ductal carcinoma. Prognostic profile from one of the 2 masses shows it to be triple positive with an MIB-1 of 30%.   (2)  s/p 4 dose dense cycles of doxorubicin/ cyclophosphamide, followed by 4 cycles of paclitaxel, dose-dense,completed 06/07/2012, given with trastuzumab   (3) trastuzumab given every 3 weeks through early June 2014, discontinued due to a drop in the ejection fraction which has recovered. Final echo 03/12/2013.  (4) s/p Right modified radical mastectomy (with prophylactic left simple mastectomy) 06/28/2012, showing a residual 2.3 cm invasive ductal carcinoma, grade 3, with 0 of 14 lymph nodes involved.  (5)  Received radiation in [place] under the care of Dr. Genelle Bal, beginning January 2014 and completed in late February 2014  (6)  Started tamoxifen March 2014  (7)  status post total hysterectomy with bilateral salpingo-oophorectomy under the care of Dr. Billy Coast,  11/13/2012.   PLAN:  Markesia is doing well in general as far as her breast cancer is concerned. She does remain somewhat anxious and has not quite recovered her prior functional status. Even though she could switch to anastrozole now, we are going to continue on tamoxifen for the time being, at least until she  completes her reconstruction. She will see me again in 6 months. At that time she will have been on tamoxifen 1 year and may wish to complete the usual 2 years before switching to aromatase inhibitor.  Otherwise she knows to call for any problems that may develop before her next visit here.  Kahlin Mark C    06/14/2013

## 2013-07-02 ENCOUNTER — Other Ambulatory Visit: Payer: Self-pay | Admitting: Physician Assistant

## 2013-07-02 ENCOUNTER — Telehealth: Payer: Self-pay | Admitting: *Deleted

## 2013-07-02 NOTE — Telephone Encounter (Signed)
Per [facility] pt is aware of her appt for 07/11/13 @ 11:45am...td

## 2013-07-02 NOTE — Progress Notes (Signed)
Spent with patient phone today [phone]), and she requests a followup visit here to "discuss her medications and over some things".  A POF was generated for an appointment next week on November 19 at 11:45. Jody Dunn was made aware of this appointment, and voiced her agreement.  Zollie Scale, PA-C 07/02/2013

## 2013-07-11 ENCOUNTER — Encounter (INDEPENDENT_AMBULATORY_CARE_PROVIDER_SITE_OTHER): Payer: Self-pay

## 2013-07-11 ENCOUNTER — Encounter: Payer: Self-pay | Admitting: Physician Assistant

## 2013-07-11 ENCOUNTER — Ambulatory Visit (HOSPITAL_BASED_OUTPATIENT_CLINIC_OR_DEPARTMENT_OTHER): Payer: Managed Care, Other (non HMO) | Admitting: Physician Assistant

## 2013-07-11 ENCOUNTER — Telehealth: Payer: Self-pay | Admitting: *Deleted

## 2013-07-11 VITALS — BP 128/79 | HR 82 | Temp 97.7°F | Resp 18 | Ht 68.0 in | Wt 175.2 lb

## 2013-07-11 DIAGNOSIS — Z9010 Acquired absence of unspecified breast and nipple: Secondary | ICD-10-CM

## 2013-07-11 DIAGNOSIS — Z90710 Acquired absence of both cervix and uterus: Secondary | ICD-10-CM

## 2013-07-11 DIAGNOSIS — C50411 Malignant neoplasm of upper-outer quadrant of right female breast: Secondary | ICD-10-CM

## 2013-07-11 DIAGNOSIS — R454 Irritability and anger: Secondary | ICD-10-CM

## 2013-07-11 DIAGNOSIS — Z170 Estrogen receptor positive status [ER+]: Secondary | ICD-10-CM

## 2013-07-11 DIAGNOSIS — C50419 Malignant neoplasm of upper-outer quadrant of unspecified female breast: Secondary | ICD-10-CM

## 2013-07-11 DIAGNOSIS — R232 Flushing: Secondary | ICD-10-CM | POA: Insufficient documentation

## 2013-07-11 DIAGNOSIS — Z90722 Acquired absence of ovaries, bilateral: Secondary | ICD-10-CM | POA: Insufficient documentation

## 2013-07-11 MED ORDER — ANASTROZOLE 1 MG PO TABS: 1.0000 mg | ORAL_TABLET | Freq: Every day | ORAL | Status: DC

## 2013-07-11 NOTE — Telephone Encounter (Signed)
appts made and printed...td 

## 2013-07-11 NOTE — Progress Notes (Signed)
ID: [name]   DOB: 1975-12-22  MR#: [id number]  ONG#:[id number]  PCP: [name], CNM GYN: Olivia Mackie, MD  SU: Abigail Miyamoto, MD OTHER MD: Genelle Bal, MD;   Brigid Re, MD [facility])  CHIEF COMPLAINT:  Right Breast Cancer   HISTORY OF PRESENT ILLNESS: Enslie had a mammogram at [facility] of 2010 because of shooting pains in the right breast. This found no worrisome finding. More recently, in the spring of 2012, the patient at delivered her second child. She nursed but the child refused to take milk from the right breast. The patient tells me that she has been able to palpate easily from the right breast, so production is not an issue. More recently, early June 2013, the patient felt a new lump in the right breast, and brought this to the attention of [name] at [facility]. The patient had repeat mammography at [facility] 02/07/2012. This found at the breast tissue to be heterogeneously dense, which is not a surprise given that the patient is lactating. There was diffuse skin thickening. Erythema is not described. The nipple was retracted. There was no definitive peau d'orange appearance. Pleomorphic calcifications in the lateral right breast extended approximately 11 cm, without definite mass. On ultrasound there was a vague irregular hypoechoic mass measuring approximately 15 mm in the right breast, with a second ill-defined hypoechoic lobulated mass measuring approximately 17 mm. Both these masses were biopsied 02/09/2012, and the final pathology ([id number]) showed both IIb invasive ductal carcinoma, with a prognostic panel (from the mass at 11:00) as follows: estrogen receptor positive at 90%, progesterone receptor positive at 90%, Mib-1 of 30%, and HER-2 amplification with a ratio of 4.29 by CISH. MRI of the breast was performed 02/15/2012 and showed the mass in the 10:00 position to measure 3.1 cm, the one at the 11:00 position to measure 3.1 cm. There was diffuse skin  thickening in the right breast and 2 enhancing abnormal-appearing lower right axillary lymph nodes measuring 2.0 and 1.8 cm respectively. There was no evidence for internal mammary adenopathy, left axillary adenopathy, or significant findings in the left breast.  INTERVAL HISTORY: Brigit returns today in between routine followup visits to discuss her medications and some associated side effects.  As a brief recap: Asher Muir received neoadjuvant chemotherapy, completed in October 2013; had bilateral mastectomies in November 2013; underwent post mastectomy radiation which was completed in February 2014;and began on tamoxifen in March of 2014. She started on tamoxifen around the same time that she underwent a total hysterectomy with bilateral salpingo-oophorectomy in March 2014.    Needless to say, Chelsia has been experiencing significant symptoms since March of 2014, some of which are associated with menopause in general, and others of which are possibly associated with the tamoxifen.  She's having terrible hot flashes. She has had increased anxiety, but denies depression. She's having a difficult time sleeping. She is very irritable and moody, and she tells me her husband and children have noticed because she is "just not like to that normally".  She was off the tamoxifen for approximately 2 weeks recently after having her reconstruction surgery at [facility]. By the tenth or twelfth day off of the tamoxifen she was "completely different", and many of these side effects had resolved. She went back on the tamoxifen, and after approximately 2 weeks, again began to experience the same side effects including hot flashes, irritability, and anxiety.  She discontinued the tamoxifen on her own this past week.  She is here  to discuss her options. She tells me she really "hates to take medication", and does not like the idea of taking other drugs to treat medication-related side effects.    REVIEW OF SYSTEMS: Chaselynn  has had no  recent illnesses and denies any fevers or chills. Her energy level is fair. She's eating well and has no nausea or change in bowel habits. She is recovering well from her reconstruction surgery, and has one final surgery scheduled for December 9. Currently, her hot flashes have improved. She currently denies any vaginal dryness and has had no vaginal discharge.  A detailed review of systems is otherwise noncontributory   PAST MEDICAL HISTORY: Past Medical History  Diagnosis Date  . Iron (Fe) deficiency anemia   . Asthma     exercise induced  . Breast cancer   . Back pain   . Migraines   . Lactose intolerance   . Hyperlipidemia   . Allergy   . Cancer     PAST SURGICAL HISTORY: Past Surgical History  Procedure Laterality Date  . Appendectomy    . Shoulder surgery    . Knee surgery      X2  . Portacath placement  02/21/2012    Procedure: INSERTION PORT-A-CATH;  Surgeon: Almond Lint, MD;  Location: [facility];  Service: General;  Laterality: N/A;  . Skin biopsy  02/21/2012    Procedure: BIOPSY SKIN;  Surgeon: Almond Lint, MD;  Location: [facility];  Service: General;  Laterality: Right;  . Breast biopsy  02/09/2012  . Mastectomy modified radical  06/28/2012    Procedure: MASTECTOMY MODIFIED RADICAL;  Surgeon: Almond Lint, MD;  Location: [facility];  Service: General;  Laterality: Right;  Right Modified Radical Mastectomy and Left Simple Mastectomy  . Simple mastectomy with axillary sentinel node biopsy  06/28/2012    Procedure: SIMPLE MASTECTOMY;  Surgeon: Almond Lint, MD;  Location: [facility];  Service: General;  Laterality: Left;  . Robotic assisted total hysterectomy with bilateral salpingo oopherectomy N/A 11/13/2012    Procedure: ROBOTIC ASSISTED TOTAL HYSTERECTOMY WITH BILATERAL SALPINGO OOPHORECTOMY;  Surgeon: Lenoard Aden, MD;  Location: [facility];  Service: Gynecology;  Laterality: N/A;  . Abdominal hysterectomy      FAMILY HISTORY Family History  Problem Relation Age of Onset  .  Cancer Paternal Aunt 27    Breast cancer (half sibling, related through father)  . Spina bifida Paternal Uncle 0    died around 44-101 months of age  . Cancer Maternal Grandmother 64    colon cancer  . Dementia Maternal Grandfather   . Cancer Paternal Grandmother     breast cancer between 71-42  . Cancer Paternal Grandfather     brain cancer  . Cancer Other     Paternal grandmother's siblings had breast cancer and pancreatic cancer  . Cancer Other 33    maternal great grandmother with breast cancer   the patient's parents are alive, in their early 18s. The patient is an only child. There is a significant family history of cancer including breast, brain, and colon, detailed in our genetic counselors report. BRCA and P. 53 testing is negative  GYNECOLOGIC HISTORY:  (Updated November 2014) Menarche age 59; she is GX P2, first live birth age 59.  She had not had periods for a long time since she had been breast-feeding, but resumed menstruating July 2013.  She stopped having menstrual cycles during neoadjuvant chemotherapy. She is status post hysterectomy and bilateral salpingo-oophorectomy in March 2014.  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 Her husband [name]. works as a Furniture conservator/restorer for [employer]. Their children are [name] (2008) and [name] (2012).   ADVANCED DIRECTIVES: not in place  HEALTH MAINTENANCE:  (updated November 2014)  History  Substance Use Topics  . Smoking status: Never Smoker   . Smokeless tobacco: Never Used  . Alcohol Use: 2.5 - 3.5 oz/week    5-7 drink(s) per week     Comment: OCCASIONAL     Colonoscopy: never  PAP: 2012/ s/p hysterectomy in March 2014  Bone density: never  Lipid panel:  June 2014, elevated    Allergies  Allergen Reactions  . Zofran [Ondansetron Hcl] Nausea And Vomiting and Other (See Comments)    Migraine headaches and vomiting  . Oxycodone  Itching and Rash  . Adhesive [Tape] Hives  . Antiseptic Products, Misc.     duraprep-rash  . Clindamycin/Lincomycin Hives  . Duraprep [employer], Misc.] Hives and Rash  . Penicillins Hives and Nausea And Vomiting    vomiting    Current Outpatient Prescriptions  Medication Sig Dispense Refill  . naproxen sodium (ANAPROX) 220 MG tablet Take 220 mg by mouth daily as needed.       . Probiotic Product (PROBIOTIC & ACIDOPHILUS EX ST) CAPS Take 1 capsule by mouth daily.       Marland Kitchen anastrozole (ARIMIDEX) 1 MG tablet Take 1 tablet (1 mg total) by mouth daily.  30 tablet  1  . tamoxifen (NOLVADEX) 20 MG tablet Take 1 tablet (20 mg total) by mouth daily.  90 tablet  3   No current facility-administered medications for this visit.   Facility-Administered Medications Ordered in Other Visits  Medication Dose Route Frequency Provider Last Rate Last Dose  . sodium chloride 0.9 % injection 10 mL  10 mL Intracatheter PRN Catalina Gravel, PA-C   10 mL at 11/01/12 1134    OBJECTIVE: Young white woman in no acute distress Filed Vitals:   07/11/13 1200  BP: 128/79  Pulse: 82  Temp: 97.7 F (36.5 C)  Resp: 18  ECOG:  0 Filed Weights   07/11/13 1200  Weight: 175 lb 3.2 oz (79.47 kg)   Remainder of physical exam was deferred today.    LAB RESULTS: Lab Results  Component Value Date   WBC 7.2 06/06/2013   NEUTROABS 5.1 06/06/2013   HGB 11.2* 06/06/2013   HCT 33.0* 06/06/2013   MCV 88.8 06/06/2013   PLT 407* 06/06/2013       Chemistry      Component Value Date/Time   NA 142 06/06/2013 1038   NA 137 11/10/2012 1444   K 4.2 06/06/2013 1038   K 3.7 11/10/2012 1444   CL 105 01/25/2013 0910   CL 97 11/10/2012 1444   CO2 26 06/06/2013 1038   CO2 26 11/10/2012 1444   BUN 9.1 06/06/2013 1038   BUN 15 11/10/2012 1444   CREATININE 0.8 06/06/2013 1038   CREATININE 0.95 11/10/2012 1444      Component Value Date/Time   CALCIUM 9.2 06/06/2013 1038   CALCIUM 9.7 11/10/2012 1444   ALKPHOS 66  06/06/2013 1038   ALKPHOS 94 11/10/2012 1444   AST 16 06/06/2013 1038   AST 22 11/10/2012 1444   ALT 14 06/06/2013 1038   ALT 18 11/10/2012 1444   BILITOT <0.20 06/06/2013 1038   BILITOT 0.3 11/10/2012 1444       Lab Results  Component Value Date   LABCA2 28 02/16/2012     STUDIES: No results found.    ASSESSMENT: 37 y.o. BRCA-negative [place] woman   (1)  status post right breast biopsy 02/09/2012 for multifocal pT4 cN1, stage IIIB invasive ductal carcinoma. Prognostic profile from one of the 2 masses shows it to be triple positive with an MIB-1 of 30%.   (2)  s/p 4 dose dense cycles of doxorubicin/ cyclophosphamide, followed by 4 cycles of paclitaxel, dose-dense,completed 06/07/2012, given with trastuzumab   (3) trastuzumab given every 3 weeks through early June 2014, discontinued due to a drop in the ejection fraction which has recovered. Final echo 03/12/2013.  (4) s/p Right modified radical mastectomy (with prophylactic left simple mastectomy) 06/28/2012, showing a residual 2.3 cm invasive ductal carcinoma, grade 3, with 0 of 14 lymph nodes involved.  (5)  Received radiation in [place] under the care of Dr. Genelle Bal, beginning January 2014 and completed in late February 2014  (6)  Started tamoxifen March 2014  (7)  status post total hysterectomy with bilateral salpingo-oophorectomy under the care of Dr. Billy Coast, 11/13/2012.   PLAN:  I have reviewed this case with Dr. Darnelle Catalan. Our entire 25 minute appointment today was spent reviewing her treatment options, and discussing possible side effects and toxicities.  In light of the fact that she has had a bilateral salpingo-oophorectomy, Asher Muir certainly qualifies for the aromatase inhibitors.  I have explained to her the difference between how tamoxifen works versus the aromatase inhibitors. We also discussed the side effects of both classes of medication, and she was given written information on all of the above.    Although she understands that she may have very similar side effects with any of the aromatase inhibitors, she would like to try anastrozole and see if she has better tolerance than she did with tamoxifen. She has just stopped the tamoxifen as noted above, and we're going to give her a short "medication vacation" until after Christmas. She will start on anastrozole, 1 mg daily, beginning December 29. I will plan on seeing her approximately 4 weeks later to assess her tolerance.   If she is tolerating the anastrozole well, we will continue. She will also need a baseline bone density study at that time.  She certainly does not want to forego anti-estrogen therapy and understands that this is an important part of her treatment plan. If she has the same complaints with the aromatase inhibitors as she had with tamoxifen, we will discuss which one to continue.  At that point, we would also talk about other medications to control side effects such as venlafaxine or gabapentin.   Alfonso  voices her understanding of the above plan and is very much in agreement with this plan. She'll plan on seeing me again in late January, but will call prior that time with any changes or problems.   Graycee Greeson PA-C    07/11/2013

## 2013-09-18 ENCOUNTER — Telehealth: Payer: Self-pay | Admitting: *Deleted

## 2013-09-18 ENCOUNTER — Encounter: Payer: Self-pay | Admitting: Physician Assistant

## 2013-09-18 ENCOUNTER — Ambulatory Visit (HOSPITAL_BASED_OUTPATIENT_CLINIC_OR_DEPARTMENT_OTHER): Payer: Managed Care, Other (non HMO) | Admitting: Physician Assistant

## 2013-09-18 ENCOUNTER — Encounter (INDEPENDENT_AMBULATORY_CARE_PROVIDER_SITE_OTHER): Payer: Self-pay

## 2013-09-18 VITALS — BP 133/90 | HR 90 | Temp 97.8°F | Resp 18 | Ht 68.0 in | Wt 178.2 lb

## 2013-09-18 DIAGNOSIS — Z170 Estrogen receptor positive status [ER+]: Secondary | ICD-10-CM

## 2013-09-18 DIAGNOSIS — E8941 Symptomatic postprocedural ovarian failure: Secondary | ICD-10-CM

## 2013-09-18 DIAGNOSIS — F419 Anxiety disorder, unspecified: Secondary | ICD-10-CM

## 2013-09-18 DIAGNOSIS — D509 Iron deficiency anemia, unspecified: Secondary | ICD-10-CM

## 2013-09-18 DIAGNOSIS — C50411 Malignant neoplasm of upper-outer quadrant of right female breast: Secondary | ICD-10-CM

## 2013-09-18 DIAGNOSIS — N951 Menopausal and female climacteric states: Secondary | ICD-10-CM

## 2013-09-18 DIAGNOSIS — C50419 Malignant neoplasm of upper-outer quadrant of unspecified female breast: Secondary | ICD-10-CM

## 2013-09-18 HISTORY — DX: Anxiety disorder, unspecified: F41.9

## 2013-09-18 MED ORDER — TAMOXIFEN CITRATE 20 MG PO TABS: 20.0000 mg | ORAL_TABLET | Freq: Every day | ORAL | Status: DC

## 2013-09-18 MED ORDER — VENLAFAXINE HCL ER 37.5 MG PO CP24: 37.5000 mg | ORAL_CAPSULE | Freq: Every day | ORAL | Status: DC

## 2013-09-18 NOTE — Progress Notes (Signed)
ID: [name]   DOB: 1976/04/20  MR#: [id number]  BWL#:[id number]  PCP: Artelia Laroche, CNM GYN: Brien Few, MD  SU: Coralie Keens, MD OTHER MD: Berton Mount, MD;   Stevphen Rochester, MD [facility])  CHIEF COMPLAINT:  Hx of Right Breast Cancer   HISTORY OF PRESENT ILLNESS: Shatika had a mammogram at [facility] may of 2010 because of shooting pains in the right breast. This found no worrisome finding. More recently, in the spring of 2012, the patient at delivered her second child. She nursed but the child refused to take milk from the right breast. The patient tells me that she has been able to palpate easily from the right breast, so production is not an issue. More recently, early June 2013, the patient felt a new lump in the right breast, and brought this to the attention of Artelia Laroche at [place]. The patient had repeat mammography at [facility] 02/07/2012. This found at the breast tissue to be heterogeneously dense, which is not a surprise given that the patient is lactating. There was diffuse skin thickening. Erythema is not described. The nipple was retracted. There was no definitive peau d'orange appearance. Pleomorphic calcifications in the lateral right breast extended approximately 11 cm, without definite mass. On ultrasound there was a vague irregular hypoechoic mass measuring approximately 15 mm in the right breast, with a second ill-defined hypoechoic lobulated mass measuring approximately 17 mm. Both these masses were biopsied 02/09/2012, and the final pathology ([id number]) showed both IIb invasive ductal carcinoma, with a prognostic panel (from the mass at 11:00) as follows: estrogen receptor positive at 90%, progesterone receptor positive at 90%, Mib-1 of 30%, and HER-2 amplification with a ratio of 4.29 by CISH. MRI of the breast was performed 02/15/2012 and showed the mass in the 10:00 position to measure 3.1 cm, the one at the 11:00 position to measure 3.1 cm. There was diffuse skin  thickening in the right breast and 2 enhancing abnormal-appearing lower right axillary lymph nodes measuring 2.0 and 1.8 cm respectively. There was no evidence for internal mammary adenopathy, left axillary adenopathy, or significant findings in the left breast.  Subsequent treatment is as detailed below.   INTERVAL HISTORY: Malvika returns today for followup of her right breast carcinoma. s a brief recap: [name] received neoadjuvant chemotherapy, completed in October 2013; had bilateral mastectomies in November 2013; underwent post mastectomy radiation which was completed in February 2014;and began on tamoxifen in March of 2014. She started on tamoxifen around the same time that she underwent a total hysterectomy with bilateral salpingo-oophorectomy in March 2014.  She discontinued the tamoxifen in mid November secondary to intolerance. She then started on anastrozole in late December 2014. She is here today to assess tolerance and discuss her plan.  Alajah tells me that she thinks the anastrozole is actually worse than the tamoxifen. Her hot flashes are "terrible". She's had increased anxiety and is very emotional. In fact she becomes tearful during our visit today which is a little unusual for her. Although she did not like the tamoxifen, she feels like it was "better than the anastrozole", and she would like to consider resuming the tamoxifen.  Interval history is also notable for the fact that she [place] the beach on [place] and fractured her right fibula, 2 toes on the right foot, and tore several ligaments. She is in a walking boot today. She is being followed closely by her orthopedist.  REVIEW OF SYSTEMS: York Cerise  has had no recent illnesses and denies any fevers or chills. She's had no rashes or skin changes, and denies any abnormal bleeding. She's having some difficulty sleeping secondary to the hot flashes, and is experiencing increased fatigue during the day. She's eating  well and has no nausea or change in bowel or bladder habits. She currently denies any vaginal dryness and has had no vaginal discharge. She has some sinus congestion but denies any cough, phlegm production, shortness of breath, chest pain, or palpitations. She's had no abnormal headaches or dizziness. She currently denies any pain, no unusual myalgias, arthralgias, or bony pain, including the right leg.  A detailed review of systems is otherwise noncontributory   PAST MEDICAL HISTORY: Past Medical History  Diagnosis Date  . Iron (Fe) deficiency anemia   . Asthma     exercise induced  . Breast cancer   . Back pain   . Migraines   . Lactose intolerance   . Hyperlipidemia   . Allergy   . Cancer   . Anxiety 09/18/2013    PAST SURGICAL HISTORY: Past Surgical History  Procedure Laterality Date  . Appendectomy    . Shoulder surgery    . Knee surgery      X2  . Portacath placement  02/21/2012    Procedure: INSERTION PORT-A-CATH;  Surgeon: Stark Klein, MD;  Location: [facility];  Service: General;  Laterality: N/A;  . Skin biopsy  02/21/2012    Procedure: BIOPSY SKIN;  Surgeon: Stark Klein, MD;  Location: [facility];  Service: General;  Laterality: Right;  . Breast biopsy  02/09/2012  . Mastectomy modified radical  06/28/2012    Procedure: MASTECTOMY MODIFIED RADICAL;  Surgeon: Stark Klein, MD;  Location: [facility];  Service: General;  Laterality: Right;  Right Modified Radical Mastectomy and Left Simple Mastectomy  . Simple mastectomy with axillary sentinel node biopsy  06/28/2012    Procedure: SIMPLE MASTECTOMY;  Surgeon: Stark Klein, MD;  Location: [facility];  Service: General;  Laterality: Left;  . Robotic assisted total hysterectomy with bilateral salpingo oopherectomy N/A 11/13/2012    Procedure: ROBOTIC ASSISTED TOTAL HYSTERECTOMY WITH BILATERAL SALPINGO OOPHORECTOMY;  Surgeon: Lovenia Kim, MD;  Location: [facility];  Service: Gynecology;  Laterality: N/A;  . Abdominal hysterectomy      FAMILY  HISTORY Family History  Problem Relation Age of Onset  . Cancer Paternal Aunt 34    Breast cancer (half sibling, related through father)  . Spina bifida Paternal Uncle 0    died around 44-24 months of age  . Cancer Maternal Grandmother 51    colon cancer  . Dementia Maternal Grandfather   . Cancer Paternal Grandmother     breast cancer between 80-42  . Cancer Paternal Grandfather     brain cancer  . Cancer Other     Paternal grandmother's siblings had breast cancer and pancreatic cancer  . Cancer Other 51    maternal great grandmother with breast cancer   the patient's parents are alive, in their early 1s. The patient is an only child. There is a significant family history of cancer including breast, brain, and colon, detailed in our genetic counselors report. BRCA and P. 53 testing is negative  GYNECOLOGIC HISTORY:  (Updated November 2014) Menarche age 40; she is GX P2, first live birth age 89.  She had not had periods for a long time since she had been breast-feeding, but resumed menstruating July 2013.  She stopped having menstrual cycles during neoadjuvant chemotherapy. She  is status post hysterectomy and bilateral salpingo-oophorectomy in March 2014.  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 Her husband Elbin Percell Miller "Ed" Quentin Angst. works as a Educational psychologist for [employer]	[place]. Their children are Dyann Kief (2008) and [name] (2012).   ADVANCED DIRECTIVES: not in place  HEALTH MAINTENANCE:  (updated January 2015)  History  Substance Use Topics  . Smoking status: Never Smoker   . Smokeless tobacco: Never Used  . Alcohol Use: 2.5 - 3.5 oz/week    5-7 drink(s) per week     Comment: OCCASIONAL     Colonoscopy: never  PAP: 2012/ s/p hysterectomy in March 2014  Bone density: never  Lipid panel:  June 2014, elevated    Allergies  Allergen Reactions  . Zofran [Ondansetron Hcl] Nausea And Vomiting and Other (See  Comments)    Migraine headaches and vomiting  . Oxycodone Itching and Rash  . Adhesive [Tape] Hives  . Antiseptic Products, Misc.     duraprep-rash  . Clindamycin/Lincomycin Hives  . Duraprep [employer], Misc.] Hives and Rash  . Penicillins Hives and Nausea And Vomiting    vomiting    Current Outpatient Prescriptions  Medication Sig Dispense Refill  . Probiotic Product (PROBIOTIC & ACIDOPHILUS EX ST) CAPS Take 1 capsule by mouth daily.       . naproxen sodium (ANAPROX) 220 MG tablet Take 220 mg by mouth daily as needed.       . tamoxifen (NOLVADEX) 20 MG tablet Take 1 tablet (20 mg total) by mouth daily.  30 tablet  1  . venlafaxine XR (EFFEXOR-XR) 37.5 MG 24 hr capsule Take 1 capsule (37.5 mg total) by mouth daily.  30 capsule  1   No current facility-administered medications for this visit.   Facility-Administered Medications Ordered in Other Visits  Medication Dose Route Frequency Provider Last Rate Last Dose  . sodium chloride 0.9 % injection 10 mL  10 mL Intracatheter PRN Theotis Burrow, PA-C   10 mL at 11/01/12 1134    OBJECTIVE: Young white woman who is slightly tearful on presentation but is in no acute distress Filed Vitals:   09/18/13 1129  BP: 133/90  Pulse: 90  Temp: 97.8 F (36.6 C)  Resp: 18  Body mass index is 27.1 kg/(m^2).  ECOG:  0 Filed Weights   09/18/13 1129  Weight: 178 lb 3.2 oz (80.831 kg)   Physical Exam: HEENT:  Sclerae anicteric.  Oropharynx clear and moist. Neck supple, trachea midline NODES:  No cervical or supraclavicular lymphadenopathy palpated.  BREAST EXAM:  Patient status post bilateral mastectomies with reconstruction. No evidence of local recurrence. Axillae are benign bilaterally, no palpable lymphadenopathy. LUNGS:  Clear to auscultation bilaterally.  No wheezes or rhonchi HEART:  Regular rate and rhythm. No murmur  ABDOMEN:  Soft, nontender.  Positive bowel sounds.  MSK:  No focal spinal tenderness to palpation. Good range  of motion bilaterally in the upper extremities. The right foot, ankle, and lower leg are bruised and swollen status post recent injury. EXTREMITIES:  Other than the right lower extremity as noted above, no additional peripheral edema is noted.   SKIN:  Benign with no visible rashes or skin lesions. No pallor. NEURO:  Nonfocal. Well oriented.  Anxious affect.    LAB RESULTS: Lab Results  Component Value Date   WBC 7.2 06/06/2013   NEUTROABS 5.1 06/06/2013   HGB 11.2* 06/06/2013   HCT 33.0* 06/06/2013  MCV 88.8 06/06/2013   PLT 407* 06/06/2013       Chemistry      Component Value Date/Time   NA 142 06/06/2013 1038   NA 137 11/10/2012 1444   K 4.2 06/06/2013 1038   K 3.7 11/10/2012 1444   CL 105 01/25/2013 0910   CL 97 11/10/2012 1444   CO2 26 06/06/2013 1038   CO2 26 11/10/2012 1444   BUN 9.1 06/06/2013 1038   BUN 15 11/10/2012 1444   CREATININE 0.8 06/06/2013 1038   CREATININE 0.95 11/10/2012 1444      Component Value Date/Time   CALCIUM 9.2 06/06/2013 1038   CALCIUM 9.7 11/10/2012 1444   ALKPHOS 66 06/06/2013 1038   ALKPHOS 94 11/10/2012 1444   AST 16 06/06/2013 1038   AST 22 11/10/2012 1444   ALT 14 06/06/2013 1038   ALT 18 11/10/2012 1444   BILITOT <0.20 06/06/2013 1038   BILITOT 0.3 11/10/2012 1444       Lab Results  Component Value Date   LABCA2 28 02/16/2012     STUDIES: No results found.    ASSESSMENT: 38 y.o. BRCA-negative [place] woman   (1)  status post right breast biopsy 02/09/2012 for multifocal pT4 cN1, stage IIIB invasive ductal carcinoma. Prognostic profile from one of the 2 masses shows it to be triple positive with an MIB-1 of 30%.   (2)  s/p 4 dose dense cycles of doxorubicin/ cyclophosphamide, followed by 4 cycles of paclitaxel, dose-dense,completed 06/07/2012, given with trastuzumab   (3) trastuzumab given every 3 weeks through early June 2014, discontinued due to a drop in the ejection fraction which has recovered. Final echo  03/12/2013.  (4) s/p Right modified radical mastectomy (with prophylactic left simple mastectomy) 06/28/2012, showing a residual 2.3 cm invasive ductal carcinoma, grade 3, with 0 of 14 lymph nodes involved.  (5)  Received radiation in [place] under the care of Dr. Berton Mount, beginning January 2014 and completed in late February 2014  (6)  Started tamoxifen March 2014.  Discontinued tamoxifen in November 2014 due to side effects and started on anastrozole in December 2014.  Anastrozole was  discontinued in January due to side effects.  (7)  status post total hysterectomy with bilateral salpingo-oophorectomy under the care of Dr. Ronita Hipps, 11/13/2012.   PLAN:  After much discussion, Marlaya would like to stop the anastrozole and her back to the tamoxifen. Although she hates both drugs, she feels like the tamoxifen was a little more tolerable than the anastrozole overall. She would like to try venlafaxine, and we will start a low dose of 37.5 mg.  She will stop the anastrozole as of today, and will start the venlafaxine tomorrow. She will then resume tamoxifen in approximately 4 weeks. If her hot flashes worsen, we can certainly consider increasing the venlafaxine to 75 mg daily.  I will plan on seeing her back in late March after she has been on the venlafaxine for 2 months and the tamoxifen for one month. We will assess tolerance at that time and adjust our plan accordingly. Currently she is scheduled to see Dr. Jana Hakim in April, and we will reevaluate her schedule when I see her again in March.  All of the above was discussed in detail with [name] today who voices understanding and agreement with our plan. She will call with any changes or problems prior to her next appointment.   Cortina Vultaggio PA-C    09/18/2013

## 2013-09-18 NOTE — Telephone Encounter (Signed)
appts made and printed...td 

## 2013-09-24 ENCOUNTER — Ambulatory Visit: Payer: Self-pay | Admitting: Radiation Oncology

## 2013-10-22 IMAGING — PT NM PET TUM IMG INITIAL (PI) SKULL BASE T - THIGH
1 of 4 series · 1 of 25 positions shown · non-contrast
Comparison: CT chest 02/28/2012 and breast MR 02/15/2012.

CLINICAL DATA: Initial treatment strategy for breast cancer.

NUCLEAR MEDICINE PET SKULL BASE TO THIGH
Fasting Blood Glucose:  85
TECHNIQUE: 13.0 mCi F-18 FDG was injected intravenously. CT data
was obtained and used for attenuation correction and anatomic
localization only.  (This was not acquired as a diagnostic CT
examination.) Additional exam technical data entered on
technologist worksheet.

[Series 2: ct images · axial · 3.8mm · 0.98mm/px · 1 of 267 slices shown]
[im 267/267  brain]
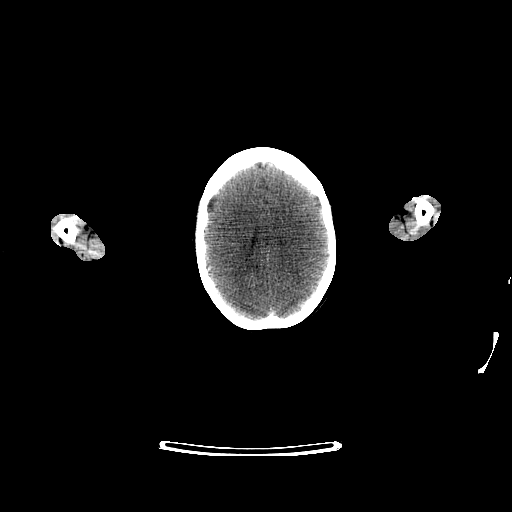

[1 of 25 positions shown; findings below may reference images not displayed]

FINDINGS: Neck: No hypermetabolic lymph nodes in the neck. CT images show no
acute findings.

Chest:  There are several foci of hypermetabolism in the right
breast.  Index lesion has an S U V max of 8.8.  Right axillary
lymph nodes are hypermetabolic as well, with an S U V max of 6.3.
No additional areas of abnormal hypermetabolism in the chest.
Interpretation of CT images of the chest will be deferred to
diagnostic examination performed the same day.

Abdomen/Pelvis:  No foci of hypermetabolism in the abdomen or
pelvis.  CT images show no acute findings.  Intrauterine
contraceptive device is in place.  No free fluid.

[place]:  No focal hypermetabolic activity to suggest skeletal
metastasis.
IMPRESSION: Multi focal hypermetabolism in the right breast with hypermetabolic
right axillary adenopathy, consistent with the given history of
breast cancer.

## 2013-10-25 ENCOUNTER — Other Ambulatory Visit: Payer: Self-pay | Admitting: *Deleted

## 2013-10-25 DIAGNOSIS — C50411 Malignant neoplasm of upper-outer quadrant of right female breast: Secondary | ICD-10-CM

## 2013-11-01 ENCOUNTER — Ambulatory Visit: Payer: Managed Care, Other (non HMO) | Attending: Oncology | Admitting: Physical Therapy

## 2013-11-01 DIAGNOSIS — I890 Lymphedema, not elsewhere classified: Secondary | ICD-10-CM | POA: Insufficient documentation

## 2013-11-01 DIAGNOSIS — IMO0001 Reserved for inherently not codable concepts without codable children: Secondary | ICD-10-CM | POA: Insufficient documentation

## 2013-11-05 ENCOUNTER — Ambulatory Visit: Payer: Managed Care, Other (non HMO) | Admitting: Physical Therapy

## 2013-11-07 ENCOUNTER — Ambulatory Visit: Payer: Managed Care, Other (non HMO) | Admitting: Physical Therapy

## 2013-11-12 ENCOUNTER — Encounter: Payer: Managed Care, Other (non HMO) | Admitting: Physical Therapy

## 2013-11-14 ENCOUNTER — Ambulatory Visit (HOSPITAL_BASED_OUTPATIENT_CLINIC_OR_DEPARTMENT_OTHER): Payer: Managed Care, Other (non HMO) | Admitting: Physician Assistant

## 2013-11-14 ENCOUNTER — Other Ambulatory Visit (HOSPITAL_BASED_OUTPATIENT_CLINIC_OR_DEPARTMENT_OTHER): Payer: Managed Care, Other (non HMO)

## 2013-11-14 ENCOUNTER — Encounter: Payer: Self-pay | Admitting: Physician Assistant

## 2013-11-14 ENCOUNTER — Telehealth: Payer: Self-pay | Admitting: Oncology

## 2013-11-14 ENCOUNTER — Encounter: Payer: Managed Care, Other (non HMO) | Admitting: Physical Therapy

## 2013-11-14 VITALS — BP 151/87 | HR 102 | Temp 98.2°F | Resp 18 | Ht 68.0 in | Wt 174.2 lb

## 2013-11-14 DIAGNOSIS — I890 Lymphedema, not elsewhere classified: Secondary | ICD-10-CM

## 2013-11-14 DIAGNOSIS — C50419 Malignant neoplasm of upper-outer quadrant of unspecified female breast: Secondary | ICD-10-CM

## 2013-11-14 DIAGNOSIS — D509 Iron deficiency anemia, unspecified: Secondary | ICD-10-CM

## 2013-11-14 DIAGNOSIS — E8941 Symptomatic postprocedural ovarian failure: Secondary | ICD-10-CM

## 2013-11-14 DIAGNOSIS — F419 Anxiety disorder, unspecified: Secondary | ICD-10-CM

## 2013-11-14 DIAGNOSIS — C50411 Malignant neoplasm of upper-outer quadrant of right female breast: Secondary | ICD-10-CM

## 2013-11-14 DIAGNOSIS — Z170 Estrogen receptor positive status [ER+]: Secondary | ICD-10-CM

## 2013-11-14 DIAGNOSIS — D649 Anemia, unspecified: Secondary | ICD-10-CM

## 2013-11-14 LAB — COMPREHENSIVE METABOLIC PANEL (CC13)
ALK PHOS: 80 U/L (ref 40–150)
ALT: 19 U/L (ref 0–55)
AST: 26 U/L (ref 5–34)
Albumin: 3.7 g/dL (ref 3.5–5.0)
Anion Gap: 11 mEq/L (ref 3–11)
BILIRUBIN TOTAL: 0.26 mg/dL (ref 0.20–1.20)
BUN: 11.7 mg/dL (ref 7.0–26.0)
CHLORIDE: 106 meq/L (ref 98–109)
CO2: 23 mEq/L (ref 22–29)
CREATININE: 0.8 mg/dL (ref 0.6–1.1)
Calcium: 9.1 mg/dL (ref 8.4–10.4)
Glucose: 110 mg/dl (ref 70–140)
Potassium: 3.6 mEq/L (ref 3.5–5.1)
Sodium: 140 mEq/L (ref 136–145)
Total Protein: 7 g/dL (ref 6.4–8.3)

## 2013-11-14 LAB — CBC WITH DIFFERENTIAL/PLATELET
BASO%: 0.6 % (ref 0.0–2.0)
BASOS ABS: 0 10*3/uL (ref 0.0–0.1)
EOS%: 2.3 % (ref 0.0–7.0)
Eosinophils Absolute: 0.1 10*3/uL (ref 0.0–0.5)
HEMATOCRIT: 34.8 % (ref 34.8–46.6)
HEMOGLOBIN: 11.2 g/dL — AB (ref 11.6–15.9)
LYMPH#: 1.1 10*3/uL (ref 0.9–3.3)
LYMPH%: 20.4 % (ref 14.0–49.7)
MCH: 29.9 pg (ref 25.1–34.0)
MCHC: 32.3 g/dL (ref 31.5–36.0)
MCV: 92.6 fL (ref 79.5–101.0)
MONO#: 0.6 10*3/uL (ref 0.1–0.9)
MONO%: 10.9 % (ref 0.0–14.0)
NEUT#: 3.6 10*3/uL (ref 1.5–6.5)
NEUT%: 65.8 % (ref 38.4–76.8)
Platelets: 312 10*3/uL (ref 145–400)
RBC: 3.76 10*6/uL (ref 3.70–5.45)
RDW: 14.4 % (ref 11.2–14.5)
WBC: 5.5 10*3/uL (ref 3.9–10.3)

## 2013-11-14 MED ORDER — VENLAFAXINE HCL ER 75 MG PO CP24
75.0000 mg | ORAL_CAPSULE | Freq: Every day | ORAL | Status: DC
Start: 2013-11-14 — End: 2014-02-06

## 2013-11-14 NOTE — Progress Notes (Signed)
ID: Jody Dunn   DOB: 10-11-1975  MR#: [id number]  CSN#:[id number]  PCP: Jody Dunn, CNM GYN: Jody Few, MD  SU: Jody Keens, MD OTHER MD: Jody Mount, MD;   Jody Rochester, MD [facility])  CHIEF COMPLAINT:  Hx of Right Breast Cancer   HISTORY OF PRESENT ILLNESS: Jody Dunn had a mammogram at [place] may of 2010 because of shooting pains in the right breast. This found no worrisome finding. More recently, in the spring of 2012, the patient at delivered her second child. She nursed but the child refused to take milk from the right breast. The patient tells me that she has been able to palpate easily from the right breast, so production is not an issue. More recently, early June 2013, the patient felt a new lump in the right breast, and brought this to the attention of Jody Dunn at [place]. The patient had repeat mammography at [facility] 02/07/2012. This found at the breast tissue to be heterogeneously dense, which is not a surprise given that the patient is lactating. There was diffuse skin thickening. Erythema is not described. The nipple was retracted. There was no definitive peau d'orange appearance. Pleomorphic calcifications in the lateral right breast extended approximately 11 cm, without definite mass. On ultrasound there was a vague irregular hypoechoic mass measuring approximately 15 mm in the right breast, with a second ill-defined hypoechoic lobulated mass measuring approximately 17 mm. Both these masses were biopsied 02/09/2012, and the final pathology ([id number]) showed both IIb invasive ductal carcinoma, with a prognostic panel (from the mass at 11:00) as follows: estrogen receptor positive at 90%, progesterone receptor positive at 90%, Mib-1 of 30%, and HER-2 amplification with a ratio of 4.29 by C[facility]. MRI of the breast was performed 02/15/2012 and showed the mass in the 10:00 position to measure 3.1 cm, the one at the 11:00 position to measure 3.1 cm. There was diffuse skin  thickening in the right breast and 2 enhancing abnormal-appearing lower right axillary lymph nodes measuring 2.0 and 1.8 cm respectively. There was no evidence for internal mammary adenopathy, left axillary adenopathy, or significant findings in the left breast.  Subsequent treatment is as detailed below.   INTERVAL HISTORY: Jody Dunn returns today for followup of her right breast carcinoma. As a brief recap: Jody Dunn completed post mastectomy radiation in February 2014.  She began on tamoxifen in March of 2014. She started on tamoxifen around the same time that she underwent a total hysterectomy with bilateral salpingo-oophorectomy in March 2014.  She discontinued the tamoxifen in mid November secondary to intolerance, tried anastrazole briefly, but eventually started back on tamoxifen after deciding that it was easier to tolerate than the AI. In the meanwhile, she also started on Effexor XR 37.5 mg in January which has helped somewhat with the hot flashes.  Jody Dunn has had a difficult couple of weeks.  She underwent additional reconstructive surgery just 2 weeks ago on 11/02/2013 under the care of Dr. Isa Dunn at [facility].  (Of note, this would likely account for the slightly low hemoglobin of 11.2 today.)  Her husband had surgery just yesterday for a hiatal hernia and abdominal hernia repair.  She also has a friend who is dying with stage 4 breast cancer.  Needless to say, Jody Dunn has been feeling more emotional than usual.  She continues to have anxiety and mood swings but no actual depression.  This issues have not worsened in general since going back on the tamoxifen, but have understandably been more noticeable over the  past Dunn days.  She would like to consider increasing her dose of Effexor to 75 mg daily to help with both the hot flashes and mood swings which is certainly reasonable.   Overall, Jody Dunn is healing well from her recent surgery and had no new physical complaints other than some increased lymphedema in  the right arm.  She is currently undergoing therapy through the lymphedema clinic.  REVIEW OF SYSTEMS: Jody Dunn  has had no recent illnesses and denies any fevers or chills. She's had no rashes or skin changes, and denies any abnormal bruising, bleeding, or clotting. Her energy level is fair.  She's eating well and has had no nausea or change in bowel or bladder habits. She denies any vaginal changes, specifically no dryness, discharge, or vaginal bleedning.  She denies any increased cough, phlegm production, shortness of breath, chest pain, or palpitations. She's had no abnormal headaches, change in vision, or dizziness. She currently denies any pain, no unusual myalgias, arthralgias, or bony pain, including the right leg which is status post injury related to a fall in January.  A detailed review of systems is otherwise noncontributory   PAST MEDICAL HISTORY: Past Medical History  Diagnosis Date  . Iron (Fe) deficiency anemia   . Asthma     exercise induced  . Breast cancer   . Back pain   . Migraines   . Lactose intolerance   . Hyperlipidemia   . Allergy   . Cancer   . Anxiety 09/18/2013    PAST SURGICAL HISTORY: Past Surgical History  Procedure Laterality Date  . Appendectomy    . Shoulder surgery    . Knee surgery      X2  . Portacath placement  02/21/2012    Procedure: INSERTION PORT-A-CATH;  Surgeon: Stark Klein, MD;  Location: [facility];  Service: General;  Laterality: N/A;  . Skin biopsy  02/21/2012    Procedure: BIOPSY SKIN;  Surgeon: Stark Klein, MD;  Location: [facility];  Service: General;  Laterality: Right;  . Breast biopsy  02/09/2012  . Mastectomy modified radical  06/28/2012    Procedure: MASTECTOMY MODIFIED RADICAL;  Surgeon: Stark Klein, MD;  Location: [facility];  Service: General;  Laterality: Right;  Right Modified Radical Mastectomy and Left Simple Mastectomy  . Simple mastectomy with axillary sentinel node biopsy  06/28/2012    Procedure: SIMPLE MASTECTOMY;  Surgeon:  Stark Klein, MD;  Location: [facility];  Service: General;  Laterality: Left;  . Robotic assisted total hysterectomy with bilateral salpingo oopherectomy N/A 11/13/2012    Procedure: ROBOTIC ASSISTED TOTAL HYSTERECTOMY WITH BILATERAL SALPINGO OOPHORECTOMY;  Surgeon: Lovenia Kim, MD;  Location: [facility];  Service: Gynecology;  Laterality: N/A;  . Abdominal hysterectomy      FAMILY HISTORY Family History  Problem Relation Age of Onset  . Cancer Paternal Aunt 48    Breast cancer (half sibling, related through father)  . Spina bifida Paternal Uncle 0    died around 40-55 months of age  . Cancer Maternal Grandmother 60    colon cancer  . Dementia Maternal Grandfather   . Cancer Paternal Grandmother     breast cancer between 54-42  . Cancer Paternal Grandfather     brain cancer  . Cancer Other     Paternal grandmother's siblings had breast cancer and pancreatic cancer  . Cancer Other 58    maternal great grandmother with breast cancer   the patient's parents are alive, in their early 67s. The patient is an  only child. There is a significant family history of cancer including breast, brain, and colon, detailed in our genetic counselors report. BRCA and P. 53 testing is negative  GYNECOLOGIC HISTORY:  (Updated November 2014) Menarche age 42; she is GX P2, first live birth age 36.  She had not had periods for a long time since she had been breast-feeding, but resumed menstruating July 2013.  She stopped having menstrual cycles during neoadjuvant chemotherapy. She is status post hysterectomy and bilateral salpingo-oophorectomy in March 2014.  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 Her husband Elbin Percell Miller "Ed" Quentin Angst. works as a Educational psychologist for [employer]	[place]. Their children are Dyann Kief (2008) and [name] (2012).   ADVANCED DIRECTIVES: not in place  HEALTH MAINTENANCE:  (updated 11/14/2013)  History  Substance Use Topics  .  Smoking status: Never Smoker   . Smokeless tobacco: Never Used  . Alcohol Use: 2.5 - 3.5 oz/week    5-7 drink(s) per week     Comment: OCCASIONAL     Colonoscopy: never  PAP: 2012/ s/p hysterectomy in March 2014  Bone density: never  Lipid panel:  June 2014, elevated    Allergies  Allergen Reactions  . Zofran [Ondansetron Hcl] Nausea And Vomiting and Other (See Comments)    Migraine headaches and vomiting  . Oxycodone Itching and Rash  . Adhesive [Tape] Hives  . Antiseptic Products, Misc.     duraprep-rash  . Clindamycin/Lincomycin Hives  . Duraprep [employer], Misc.] Hives and Rash  . Penicillins Hives and Nausea And Vomiting    vomiting    Current Outpatient Prescriptions  Medication Sig Dispense Refill  . naproxen sodium (ANAPROX) 220 MG tablet Take 220 mg by mouth daily as needed.       . Probiotic Product (PROBIOTIC & ACIDOPHILUS EX ST) CAPS Take 1 capsule by mouth daily.       . tamoxifen (NOLVADEX) 20 MG tablet Take 1 tablet (20 mg total) by mouth daily.  30 tablet  1  . venlafaxine XR (EFFEXOR-XR) 75 MG 24 hr capsule Take 1 capsule (75 mg total) by mouth daily.  30 capsule  5   No current facility-administered medications for this visit.   Facility-Administered Medications Ordered in Other Visits  Medication Dose Route Frequency Provider Last Rate Last Dose  . sodium chloride 0.9 % injection 10 mL  10 mL Intracatheter PRN Theotis Burrow, PA-C   10 mL at 11/01/12 1134    OBJECTIVE: Young white woman who is anxious today but is in no acute distress Filed Vitals:   11/14/13 1316  BP: 151/87  Pulse: 102  Temp: 98.2 F (36.8 C)  Resp: 18  Body mass index is 26.49 kg/(m^2).  ECOG:  0 Filed Weights   11/14/13 1316  Weight: 174 lb 3.2 oz (79.017 kg)   Physical Exam: HEENT:  Sclerae anicteric.  Oropharynx clear, pink, and moist. Neck supple, trachea midline.  No thyromegaly. NODES:  No cervical or supraclavicular lymphadenopathy palpated.  BREAST EXAM:   Patient status post bilateral mastectomies with reconstruction. Recent nipple reconstruction with well-healing incisions.  No drainage, erythema, or evidence of infection.  No evidence of local recurrence. Axillae are benign bilaterally, no palpable lymphadenopathy. LUNGS:  Clear to auscultation bilaterally with good excursion.  No wheezes or rhonchi HEART:  Regular rate and rhythm. No murmur appreciated. ABDOMEN:  Soft, nontender. No organomegaly or masses.  Positive bowel sounds.  MSK:  No focal spinal  tenderness to palpation. Good range of motion bilaterally in the upper extremities.  EXTREMITIES: Nonpitting lymphedema in the right upper extremity.  No additional peripheral edema noted.  SKIN:  Benign with no visible rashes or skin lesions. No excessive ecchymoses or petechiae. No pallor. NEURO:  Nonfocal. Well oriented.  Anxious affect.    LAB RESULTS: Lab Results  Component Value Date   WBC 5.5 11/14/2013   NEUTROABS 3.6 11/14/2013   HGB 11.2* 11/14/2013   HCT 34.8 11/14/2013   MCV 92.6 11/14/2013   PLT 312 11/14/2013       Chemistry      Component Value Date/Time   NA 140 11/14/2013 1308   NA 137 11/10/2012 1444   K 3.6 11/14/2013 1308   K 3.7 11/10/2012 1444   CL 105 01/25/2013 0910   CL 97 11/10/2012 1444   CO2 23 11/14/2013 1308   CO2 26 11/10/2012 1444   BUN 11.7 11/14/2013 1308   BUN 15 11/10/2012 1444   CREATININE 0.8 11/14/2013 1308   CREATININE 0.95 11/10/2012 1444      Component Value Date/Time   CALCIUM 9.1 11/14/2013 1308   CALCIUM 9.7 11/10/2012 1444   ALKPHOS 80 11/14/2013 1308   ALKPHOS 94 11/10/2012 1444   AST 26 11/14/2013 1308   AST 22 11/10/2012 1444   ALT 19 11/14/2013 1308   ALT 18 11/10/2012 1444   BILITOT 0.26 11/14/2013 1308   BILITOT 0.3 11/10/2012 1444         STUDIES: No results found.    ASSESSMENT: 38 y.o. BRCA-negative  woman   (1)  status post right breast biopsy 02/09/2012 for multifocal pT4 cN1, stage IIIB invasive ductal carcinoma.  Prognostic profile from one of the 2 masses shows it to be triple positive with an MIB-1 of 30%.   (2)  s/p 4 dose dense cycles of doxorubicin/ cyclophosphamide, followed by 4 cycles of paclitaxel, dose-dense,completed 06/07/2012, given with trastuzumab   (3) trastuzumab given every 3 weeks through early June 2014, discontinued due to a drop in the ejection fraction which has recovered. Final echo 03/12/2013.  (4) s/p Right modified radical mastectomy (with prophylactic left simple mastectomy) 06/28/2012, showing a residual 2.3 cm invasive ductal carcinoma, grade 3, with 0 of 14 lymph nodes involved.  (5)  Received radiation in [place] under the care of Dr. Berton Dunn, beginning January 2014 and completed in late February 2014  (6)  Started tamoxifen March 2014.  Discontinued tamoxifen in November 2014 due to side effects and started on anastrozole in December 2014.  Anastrozole was  discontinued in January due to side effects.  Restarted tamoxifen in February 2015, but also started on Effexor for hot flashes, anxiety, and mood swings.  (7)  status post total hysterectomy with bilateral salpingo-oophorectomy under the care of Dr. Ronita Hipps, 11/13/2012.  (8)  Status post bilateral breast reconstruction under the care of Dr. Isa Dunn at [place]:  Amarea is doing well with regards to her breast cancer and feels comfortable staying on the tamoxifen.  As noted above, we are going to try increasing her Effexor XR from 37.5 mg to 75 mg daily for the hot flashes and the anxiety.  I think she will find this helpful.  I also encouraged her to exercise.  Aubrii will be 2 years out from diagnosis in June and is very anxious to have her restaging scans done in June.  She is anxious about the possibility of recurrence, and we will go ahead and  restage as we have previously discussed.  Accordingly, she will have a Chest CT and PET scan in mid June prior to her next q 3 month follow up visit.   All of the  above was discussed in detail with Jody Dunn today who voices understanding and agreement with our plan. She will call with any changes or problems prior to her next appointment here in June.   Adra Shepler PA-C    11/14/2013

## 2013-11-14 NOTE — Telephone Encounter (Signed)
, °

## 2013-11-15 ENCOUNTER — Telehealth: Payer: Self-pay | Admitting: Physician Assistant

## 2013-11-15 ENCOUNTER — Ambulatory Visit: Payer: Managed Care, Other (non HMO)

## 2013-11-15 NOTE — Telephone Encounter (Signed)
, °

## 2013-11-16 ENCOUNTER — Other Ambulatory Visit: Payer: Self-pay | Admitting: *Deleted

## 2013-11-19 ENCOUNTER — Ambulatory Visit: Payer: Managed Care, Other (non HMO) | Admitting: Physical Therapy

## 2013-11-21 ENCOUNTER — Encounter: Payer: Managed Care, Other (non HMO) | Admitting: Physical Therapy

## 2013-11-26 ENCOUNTER — Ambulatory Visit: Payer: Managed Care, Other (non HMO) | Attending: Oncology | Admitting: Physical Therapy

## 2013-11-26 DIAGNOSIS — I890 Lymphedema, not elsewhere classified: Secondary | ICD-10-CM | POA: Insufficient documentation

## 2013-11-26 DIAGNOSIS — IMO0001 Reserved for inherently not codable concepts without codable children: Secondary | ICD-10-CM | POA: Insufficient documentation

## 2013-11-28 ENCOUNTER — Encounter: Payer: Managed Care, Other (non HMO) | Admitting: Physical Therapy

## 2013-11-30 ENCOUNTER — Encounter: Payer: Managed Care, Other (non HMO) | Admitting: Physical Therapy

## 2013-12-11 ENCOUNTER — Telehealth: Payer: Self-pay | Admitting: Oncology

## 2013-12-11 ENCOUNTER — Ambulatory Visit: Payer: Managed Care, Other (non HMO) | Admitting: Oncology

## 2013-12-11 ENCOUNTER — Other Ambulatory Visit: Payer: Managed Care, Other (non HMO)

## 2013-12-11 NOTE — Telephone Encounter (Signed)
, °

## 2014-01-07 ENCOUNTER — Telehealth (INDEPENDENT_AMBULATORY_CARE_PROVIDER_SITE_OTHER): Payer: Self-pay

## 2014-01-07 NOTE — Telephone Encounter (Signed)
LMOV pt to call.  Questions regarding her 01/25/14 appt with Dr. Barry Dienes.  Please ask for Bushra Denman.

## 2014-01-09 ENCOUNTER — Ambulatory Visit: Payer: Managed Care, Other (non HMO) | Attending: Oncology | Admitting: Physical Therapy

## 2014-01-09 DIAGNOSIS — IMO0001 Reserved for inherently not codable concepts without codable children: Secondary | ICD-10-CM | POA: Insufficient documentation

## 2014-01-09 DIAGNOSIS — I890 Lymphedema, not elsewhere classified: Secondary | ICD-10-CM | POA: Insufficient documentation

## 2014-01-25 ENCOUNTER — Encounter (INDEPENDENT_AMBULATORY_CARE_PROVIDER_SITE_OTHER): Payer: Self-pay | Admitting: General Surgery

## 2014-01-25 ENCOUNTER — Ambulatory Visit (INDEPENDENT_AMBULATORY_CARE_PROVIDER_SITE_OTHER): Payer: Managed Care, Other (non HMO) | Admitting: General Surgery

## 2014-01-25 VITALS — BP 122/78 | HR 76 | Resp 16 | Ht 68.0 in | Wt 174.6 lb

## 2014-01-25 DIAGNOSIS — K432 Incisional hernia without obstruction or gangrene: Secondary | ICD-10-CM

## 2014-01-25 NOTE — Progress Notes (Signed)
Subjective:     Patient ID: Jody Dunn, female   DOB: 12/15/1975, 38 y.o.   MRN: [id number]  HPI Pt is s/p bilateral mastectomies and right ALND for advanced right breast cancer.  She had bilateral DIEP flap reconstruction.  She has had a new bulge present in her midline below her DIEP flap.  This was not there previously.  She has not had any nausea/vomiting/obstructive symptoms.  It is a bit sore.    Review of Systems  All other systems reviewed and are negative.      Objective:   Physical Exam  Constitutional: She is oriented to person, place, and time. She appears well-developed and well-nourished. No distress.  HENT:  Head: Normocephalic and atraumatic.  Eyes: Conjunctivae are normal. Pupils are equal, round, and reactive to light. Right eye exhibits no discharge. Left eye exhibits no discharge. No scleral icterus.  Neck: Normal range of motion. No tracheal deviation present. No thyromegaly present.  Cardiovascular: Normal rate and intact distal pulses.   Pulmonary/Chest: Effort normal. No respiratory distress.    Surgically reconstructed breasts.  No masses.   No axillary adenopathy.    Abdominal: Soft. She exhibits no distension. There is no tenderness. A hernia is present.    Slight bulge in midline below incision.  Feels like probable hernia.    Musculoskeletal: Normal range of motion. She exhibits no edema and no tenderness.  Lymphadenopathy:    She has no cervical adenopathy.  Neurological: She is alert and oriented to person, place, and time.  Skin: Skin is warm and dry. No rash noted. She is not diaphoretic. No erythema. No pallor.  Psychiatric: She has a normal mood and affect. Her behavior is normal. Judgment and thought content normal.       Assessment/Plan:     Incisional hernia, without obstruction or gangrene Hernia suspected just below DIEP flap incision in midline.    Pt has PET scan scheduled next week.  Should be able to see this area on  scan.  Reviewed risks of surgery including bowel injury, bleeding, infection, damage to adjacent structures.  Reviewed surgery.  Will wait to schedule until we see scan.     No clinical evidence of disease.  PET next week.

## 2014-01-25 NOTE — Patient Instructions (Signed)
Laparoscopic Ventral Hernia Repair Laparoscopic ventral hernia repairis a surgery to fix a ventral hernia. Aventral hernia, also called an incisional hernia, is a bulge of body tissue or intestines that pushes through the front part of the abdomen. This can happen if the connective tissue covering the muscles over the abdomen has a weak spot or is torn because of a surgical cut (incision) from a previous surgery. Laparoscopic ventral hernia repair is often done soon after diagnosis to stop the hernia from getting bigger, becoming uncomfortable, or becoming an emergency. This surgery usually takes about 2 hours, but the time can vary greatly. LET YOUR CAREGIVER KNOW ABOUT:  Any allergies you have.  All medicines you are taking, including steroids, vitamins, herbs, eyedrops, and over-the-counter medicines and creams.  Previous problems you or members of your family have had with the use of anesthetics.  Any blood disorders or bleeding problems you have had.  Past surgeries you have had.  Other health problems you have. RISKS AND COMPLICATIONS  Generally, laparoscopic ventral hernia repair is a safe procedure. However, as with any surgical procedure, complications can occur. Possible complications include:  Bleeding.  Trouble passing urine or having a bowel movement after the operation.  Infection.  Pneumonia.  Blood clots.  Pain in the area of the hernia.  A bulge in the area of the hernia that may be caused by a collection of fluid.  Injury to intestines or other structures in the abdomen.  Return of the hernia after surgery. In some cases, the caregiver may need to stop the laparoscopic procedure and do regular, open surgery. This may be necessary for very difficult hernias, when organs are hard to see, or when bleeding problems occur during surgery. BEFORE THE PROCEDURE   You may need to have blood tests, urine tests, a chest X-ray, or electrocardiography done before the day  of the surgery.  Ask your caregiver about changing or stopping your regular medicines.  You may need to wash with a special type of germ-killing soap.  Do not eat or drink anything for at least 6 hours before the surgery.  Make plans to have someone drive you home after the procedure. PROCEDURE   Small monitors will be put on your body. They are used to check your heart, blood pressure, and oxygen level.  An intravenous (IV) access tube will be put into a vein in your hand or arm. Fluids and medicine will flow directly into your body through the IV tube.  You will be given medicine to make you sleep through the procedure (general anesthetic).  Your abdomen will be cleaned with a special soap to kill any germs on your skin.  Once you are asleep, several small incisions will be made in your abdomen.  The large space in your abdomen will be filled with air so that it expands. This gives the caregiver more room and a better view.  A thin, lighted tube with a tiny camera on the end (laparoscope) is put through a small incision in your abdomen. The camera on the laparoscope sends a picture to a TV screen in the operating room. This gives the caregiver a good view inside the abdomen.  Hollow tubes are put through the other small incisions in your abdomen. The tools needed for the procedure are put through these tubes.  The caregiver puts the tissue or intestines that formed the hernia back in place.  A screen-like patch (mesh) is used to close the hernia. This helps make   the area stronger. Stitches, tacks, or staples are used to keep the mesh in place.  Medicine and a bandage (dressing) or skin glue will be put over the incisions. AFTER THE PROCEDURE   You will stay in a recovery area until the anesthetic wears off. Your blood pressure and pulse will be checked often.  You may be able to go home the same day or may need to stay in the hospital for 1 or 2 days after this surgery. Your  caregiver will decide when you can go home.  You may feel some pain. You will likely be given medicine for pain.  You will be urged to do breathing exercises that involve taking deep breaths. This helps prevent a lung infection after a surgery.  You may have to wear compression stockings while you are in the hospital. These stockings help keep blood clots from forming in your legs. Document Released: 07/26/2012 Document Reviewed: 07/26/2012 [employer] Patient Information 2014 [employer].  

## 2014-01-25 NOTE — Assessment & Plan Note (Signed)
Hernia suspected just below DIEP flap incision in midline.    Pt has PET scan scheduled next week.  Should be able to see this area on scan.  Reviewed risks of surgery including bowel injury, bleeding, infection, damage to adjacent structures.  Reviewed surgery.  Will wait to schedule until we see scan.

## 2014-01-29 ENCOUNTER — Encounter (INDEPENDENT_AMBULATORY_CARE_PROVIDER_SITE_OTHER): Payer: Self-pay | Admitting: General Surgery

## 2014-01-29 ENCOUNTER — Encounter: Payer: Self-pay | Admitting: Physician Assistant

## 2014-01-29 ENCOUNTER — Other Ambulatory Visit: Payer: Self-pay | Admitting: *Deleted

## 2014-01-29 NOTE — Telephone Encounter (Signed)
Noted call documentation from Hinda Lenis RN in reference to this note.  No action by this nurse.  Will complete this communication at this time.

## 2014-01-29 NOTE — Progress Notes (Signed)
This RN spoke with pt per her call stating frustration relating to scheduled PET scan for 01/30/2014 "and I was just called and informed it has to be rescheduled because my insurance has not approved it "  " why am I just now finding out about this when the scan was scheduled months ago "  " I am needing this scan now because Dr Barry Dienes has to do additional surgery and she was going to use this scan for surgical decisions "  " and then I am changing insurances later this month and have vacations planned as well "  This RN validated pt's frustration per above as well as explained situation is occuring frequently with other pt's scheduled scans and her concerns will be given to our office manager for follow up.  This RN did inquire with managed care per this scan and was informed it didn't " fall into my work que appropriately "  Pt's data has been sent to [employer] and is currently under review.  PET scan presently has been rescheduled to 6/18 which may delay pt's surgery under Dr Barry Dienes which may be an issue with pt's planned vacations and change in insurance at end of this month.  Jody Dunn has contacted Dr Marlowe Aschoff office with concern " in case I need a CT for surgical decisions ".  Jody Dunn did ask " do you think there is anyway I could still get the PET scan done tomorrow ?"  This RN explained to pt that above is unlikely due to cost concerns and need for pharmacy to prepare scan contrast day before procedure.  Presently no other needs at this time.

## 2014-01-30 ENCOUNTER — Ambulatory Visit (HOSPITAL_COMMUNITY)
Admission: RE | Admit: 2014-01-30 | Discharge: 2014-01-30 | Disposition: A | Discharge status: Home / Self Care | Payer: Managed Care, Other (non HMO) | Source: Ambulatory Visit | Attending: Oncology | Admitting: Oncology

## 2014-01-30 ENCOUNTER — Other Ambulatory Visit (HOSPITAL_BASED_OUTPATIENT_CLINIC_OR_DEPARTMENT_OTHER): Payer: Managed Care, Other (non HMO)

## 2014-01-30 ENCOUNTER — Other Ambulatory Visit: Payer: Self-pay | Admitting: *Deleted

## 2014-01-30 ENCOUNTER — Encounter (HOSPITAL_COMMUNITY): Payer: Managed Care, Other (non HMO)

## 2014-01-30 ENCOUNTER — Ambulatory Visit (HOSPITAL_COMMUNITY): Payer: Managed Care, Other (non HMO)

## 2014-01-30 ENCOUNTER — Encounter (HOSPITAL_COMMUNITY): Payer: Self-pay

## 2014-01-30 ENCOUNTER — Other Ambulatory Visit: Payer: Self-pay | Admitting: Oncology

## 2014-01-30 DIAGNOSIS — C50411 Malignant neoplasm of upper-outer quadrant of right female breast: Secondary | ICD-10-CM

## 2014-01-30 DIAGNOSIS — Z9010 Acquired absence of unspecified breast and nipple: Secondary | ICD-10-CM | POA: Insufficient documentation

## 2014-01-30 DIAGNOSIS — R1900 Intra-abdominal and pelvic swelling, mass and lump, unspecified site: Secondary | ICD-10-CM

## 2014-01-30 DIAGNOSIS — T66XXXA Radiation sickness, unspecified, initial encounter: Secondary | ICD-10-CM | POA: Insufficient documentation

## 2014-01-30 DIAGNOSIS — C50419 Malignant neoplasm of upper-outer quadrant of unspecified female breast: Secondary | ICD-10-CM

## 2014-01-30 DIAGNOSIS — Y842 Radiological procedure and radiotherapy as the cause of abnormal reaction of the patient, or of later complication, without mention of misadventure at the time of the procedure: Secondary | ICD-10-CM | POA: Insufficient documentation

## 2014-01-30 DIAGNOSIS — K449 Diaphragmatic hernia without obstruction or gangrene: Secondary | ICD-10-CM | POA: Insufficient documentation

## 2014-01-30 DIAGNOSIS — Z853 Personal history of malignant neoplasm of breast: Secondary | ICD-10-CM | POA: Insufficient documentation

## 2014-01-30 DIAGNOSIS — Z923 Personal history of irradiation: Secondary | ICD-10-CM | POA: Insufficient documentation

## 2014-01-30 DIAGNOSIS — Z9221 Personal history of antineoplastic chemotherapy: Secondary | ICD-10-CM | POA: Insufficient documentation

## 2014-01-30 LAB — CBC WITH DIFFERENTIAL/PLATELET
BASO%: 0.8 % (ref 0.0–2.0)
BASOS ABS: 0 10*3/uL (ref 0.0–0.1)
EOS ABS: 0.2 10*3/uL (ref 0.0–0.5)
EOS%: 3 % (ref 0.0–7.0)
HEMATOCRIT: 40.4 % (ref 34.8–46.6)
HEMOGLOBIN: 13.4 g/dL (ref 11.6–15.9)
LYMPH%: 27.5 % (ref 14.0–49.7)
MCH: 30 pg (ref 25.1–34.0)
MCHC: 33.3 g/dL (ref 31.5–36.0)
MCV: 90.2 fL (ref 79.5–101.0)
MONO#: 0.4 10*3/uL (ref 0.1–0.9)
MONO%: 7.7 % (ref 0.0–14.0)
NEUT%: 61 % (ref 38.4–76.8)
NEUTROS ABS: 3.3 10*3/uL (ref 1.5–6.5)
PLATELETS: 270 10*3/uL (ref 145–400)
RBC: 4.48 10*6/uL (ref 3.70–5.45)
RDW: 14.8 % — AB (ref 11.2–14.5)
WBC: 5.4 10*3/uL (ref 3.9–10.3)
lymph#: 1.5 10*3/uL (ref 0.9–3.3)

## 2014-01-30 LAB — COMPREHENSIVE METABOLIC PANEL (CC13)
ALBUMIN: 3.8 g/dL (ref 3.5–5.0)
ALK PHOS: 84 U/L (ref 40–150)
ALT: 23 U/L (ref 0–55)
ANION GAP: 10 meq/L (ref 3–11)
AST: 31 U/L (ref 5–34)
BUN: 11.5 mg/dL (ref 7.0–26.0)
CALCIUM: 9.1 mg/dL (ref 8.4–10.4)
CHLORIDE: 104 meq/L (ref 98–109)
CO2: 26 mEq/L (ref 22–29)
CREATININE: 0.9 mg/dL (ref 0.6–1.1)
GLUCOSE: 94 mg/dL (ref 70–140)
POTASSIUM: 4.2 meq/L (ref 3.5–5.1)
Sodium: 140 mEq/L (ref 136–145)
Total Bilirubin: 0.39 mg/dL (ref 0.20–1.20)
Total Protein: 7.5 g/dL (ref 6.4–8.3)

## 2014-01-30 MED ORDER — IOHEXOL 300 MG/ML  SOLN
50.0000 mL | Freq: Once | INTRAMUSCULAR | Status: AC | PRN
  Administered 2014-01-30: 50 mL via ORAL

## 2014-01-30 MED ORDER — IOHEXOL 300 MG/ML  SOLN
100.0000 mL | Freq: Once | INTRAMUSCULAR | Status: AC | PRN
  Administered 2014-01-30: 100 mL via INTRAVENOUS

## 2014-02-02 ENCOUNTER — Telehealth: Payer: Self-pay

## 2014-02-02 NOTE — Telephone Encounter (Signed)
Rcvd fax from [facility] 01/29/2014 - medication non-adherence therapy advisory.  Original to scan.  Copy to AB

## 2014-02-04 ENCOUNTER — Other Ambulatory Visit: Payer: Self-pay | Admitting: *Deleted

## 2014-02-04 ENCOUNTER — Telehealth: Payer: Self-pay | Admitting: Oncology

## 2014-02-04 DIAGNOSIS — C50419 Malignant neoplasm of upper-outer quadrant of unspecified female breast: Secondary | ICD-10-CM

## 2014-02-04 DIAGNOSIS — C50411 Malignant neoplasm of upper-outer quadrant of right female breast: Secondary | ICD-10-CM

## 2014-02-04 NOTE — Telephone Encounter (Signed)
Faxed pt medical records to [facility]

## 2014-02-04 NOTE — Progress Notes (Signed)
This RN spoke with pt per her call with office manager and concern to obtain PET - and if possible before 6/18.  This RN called managed care and per their inquiry was informed bone scan would be covered but PET is not under pt's [employer] benefit. Per MD review of scans which overall shows NED - obtaining a bone scan would complete the staging concerns at this pt's two year mark  Called and discussed with pt who stated understanding - note pt was hoping to obtain scan before scheduled appt with AB/PA on 6/17.  This RN called to central scheduling and was told next open available slot was 6/26 - note per this RN requesting [facility] or [facility] - scheduler called to [facility] to be informed that they are down to one machine presently with no ability for work ins.  This RN then called directly to nuclear med and spoke with Jody Dunn- discussed pt's need and obtained a work in [place] for 6/18 at 1130 inj 230 scan.  This RN called pt and informed her of above date and time.  Jody Dunn stated above date and time would be difficult for her due to " I have already taken a lot of time off of work and arranged child care "  Per discussion Jody Dunn states best next date for her would be 02/18/2014.  This RN called central scheduling and obtained 12 noon inj with 3pm scan on 02/18/2014.

## 2014-02-05 ENCOUNTER — Ambulatory Visit (HOSPITAL_COMMUNITY): Payer: Managed Care, Other (non HMO)

## 2014-02-05 ENCOUNTER — Encounter (HOSPITAL_COMMUNITY): Payer: Managed Care, Other (non HMO)

## 2014-02-06 ENCOUNTER — Encounter: Payer: Self-pay | Admitting: Physician Assistant

## 2014-02-06 ENCOUNTER — Telehealth: Payer: Self-pay | Admitting: Physician Assistant

## 2014-02-06 ENCOUNTER — Ambulatory Visit (HOSPITAL_BASED_OUTPATIENT_CLINIC_OR_DEPARTMENT_OTHER): Payer: Managed Care, Other (non HMO) | Admitting: Physician Assistant

## 2014-02-06 VITALS — BP 132/92 | HR 95 | Temp 98.4°F | Resp 20 | Ht 68.0 in | Wt 170.3 lb

## 2014-02-06 DIAGNOSIS — C50419 Malignant neoplasm of upper-outer quadrant of unspecified female breast: Secondary | ICD-10-CM

## 2014-02-06 DIAGNOSIS — Z853 Personal history of malignant neoplasm of breast: Secondary | ICD-10-CM

## 2014-02-06 DIAGNOSIS — C50411 Malignant neoplasm of upper-outer quadrant of right female breast: Secondary | ICD-10-CM

## 2014-02-06 DIAGNOSIS — G4700 Insomnia, unspecified: Secondary | ICD-10-CM

## 2014-02-06 DIAGNOSIS — I890 Lymphedema, not elsewhere classified: Secondary | ICD-10-CM

## 2014-02-06 DIAGNOSIS — Z90722 Acquired absence of ovaries, bilateral: Secondary | ICD-10-CM

## 2014-02-06 DIAGNOSIS — E8941 Symptomatic postprocedural ovarian failure: Secondary | ICD-10-CM

## 2014-02-06 DIAGNOSIS — Z170 Estrogen receptor positive status [ER+]: Secondary | ICD-10-CM

## 2014-02-06 DIAGNOSIS — F411 Generalized anxiety disorder: Secondary | ICD-10-CM

## 2014-02-06 DIAGNOSIS — Z90710 Acquired absence of both cervix and uterus: Secondary | ICD-10-CM

## 2014-02-06 DIAGNOSIS — F419 Anxiety disorder, unspecified: Secondary | ICD-10-CM

## 2014-02-06 MED ORDER — LORAZEPAM 0.5 MG PO TABS: 0.2500 mg | ORAL_TABLET | Freq: Two times a day (BID) | ORAL | Status: DC | PRN

## 2014-02-06 MED ORDER — TAMOXIFEN CITRATE 20 MG PO TABS: 20.0000 mg | ORAL_TABLET | Freq: Every day | ORAL | Status: DC

## 2014-02-06 NOTE — Telephone Encounter (Signed)
, °

## 2014-02-06 NOTE — Progress Notes (Signed)
ID: Jody Dunn   DOB: 16-Jul-1976  MR#: [id number]  IRJ#:[id number]  PCP: Artelia Laroche, CNM GYN: Brien Few, MD  SU: Coralie Keens, MD OTHER MD: Berton Mount, MD;   Stevphen Rochester, MD [facility]);  Stark Klein, MD  CHIEF COMPLAINT:  Hx of Right Breast Cancer   HISTORY OF PRESENT ILLNESS: Jody Dunn had a mammogram at [facility] in May of 2010 because of shooting pains in the right breast. This found no worrisome finding. In the spring of 2012, the patient at delivered her second child. She nursed but the child refused to take milk from the right breast. The patient tells me that she has been able to pump easily from the right breast, so production is not an issue.   In early June 2013, the patient felt a new lump in the right breast, and brought this to the attention of Artelia Laroche at [place]. The patient had repeat mammography at [facility] 02/07/2012. This found at the breast tissue to be heterogeneously dense, which is not a surprise given that the patient is lactating. There was diffuse skin thickening. Erythema is not described. The nipple was retracted. There was no definitive peau d'orange appearance. Pleomorphic calcifications in the lateral right breast extended approximately 11 cm, without definite mass. On ultrasound there was a vague irregular hypoechoic mass measuring approximately 15 mm in the right breast, with a second ill-defined hypoechoic lobulated mass measuring approximately 17 mm.   Both these masses were biopsied 02/09/2012, and the final pathology ([id number]) showed both IIb invasive ductal carcinoma, with a prognostic panel (from the mass at 11:00) as follows: estrogen receptor positive at 90%, progesterone receptor positive at 90%, Mib-1 of 30%, and HER-2 amplification with a ratio of 4.29 by CISH. MRI of the breast was performed 02/15/2012 and showed the mass in the 10:00 position to measure 3.1 cm, the one at the 11:00 position to measure 3.1 cm. There was diffuse skin  thickening in the right breast and 2 enhancing abnormal-appearing lower right axillary lymph nodes measuring 2.0 and 1.8 cm respectively. There was no evidence for internal mammary adenopathy, left axillary adenopathy, or significant findings in the left breast.  Subsequent treatment is as detailed below.   INTERVAL HISTORY: Jody Dunn returns alone today for followup of her right breast carcinoma.  She continues on her tamoxifen, although she feels like she tolerates it poorly. (Recall that she has tried anastrozole briefly, but felt that it was "even worse" and decided to return to tamoxifen.)  She continues to have terrible hot flashes. She has some mood swings although she feels like these have improved slightly. She continues to feel very anxious and often has difficulty sleeping. She tried taking venlafaxine but did not like how she felt on the medication. She takes an occasional dose of lorazepam for the anxiety, especially at night to help her sleep. She takes this infrequently, typically taking less than one whole tablet.  Interval history is notable for Jody Dunn having had restaging scans. We had originally requested CT of the chest along with a PET scan. There were some problems getting this scheduled and approved through her insurance, and eventually we obtained CTs of the chest, abdomen, and pelvis for complete evaluation on 01/30/2014. These are detailed below, but basically showed no evidence of local disease recurrence or metastatic disease. Incidentally, there was evidence of a healing nondisplaced right fourth rib fracture. Jody Dunn denies any significant pain in the area, although it is occasionally "sore". She has been scheduled for  a bone scan to complete restaging, and that has been set up for 02/18/2014.  REVIEW OF SYSTEMS: Jody Dunn  has had no recent illnesses and denies any fevers or chills. As noted above, she continues to have significant hot flashes and night sweats. Her energy level is  fairly good, although she continues to have some difficulty sleeping. She denies any rashes or skin changes and has had no abnormal bruising or bleeding. She denies any clots. She's eating and drinking well with no nausea, emesis, or change in bowel or bladder habits.  She has an incisional hernia in the lower abdomen which she is hoping will be repaired soon, but denies any pain in the area. She's had no vaginal dryness, discharge, or vaginal bleeding.  She denies any increased cough, phlegm production, shortness of breath, chest pain, or palpitations. She has chronic lymphedema in the right upper extremity, and she does have a sleeve to use as needed. She has no additional peripheral neuropathy. She currently denies any unusual myalgias, arthralgias, or bony pain. She has had no abnormal headaches and denies any dizziness or recent change in vision.  A detailed review of systems is otherwise noncontributory   PAST MEDICAL HISTORY: Past Medical History  Diagnosis Date  . Iron (Fe) deficiency anemia   . Asthma     exercise induced  . Back pain   . Migraines   . Lactose intolerance   . Hyperlipidemia   . Allergy   . Anxiety 09/18/2013  . Breast cancer   . Cancer     PAST SURGICAL HISTORY: Past Surgical History  Procedure Laterality Date  . Appendectomy    . Shoulder surgery    . Knee surgery      X2  . Portacath placement  02/21/2012    Procedure: INSERTION PORT-A-CATH;  Surgeon: Stark Klein, MD;  Location: [facility];  Service: General;  Laterality: N/A;  . Skin biopsy  02/21/2012    Procedure: BIOPSY SKIN;  Surgeon: Stark Klein, MD;  Location: [facility];  Service: General;  Laterality: Right;  . Breast biopsy  02/09/2012  . Mastectomy modified radical  06/28/2012    Procedure: MASTECTOMY MODIFIED RADICAL;  Surgeon: Stark Klein, MD;  Location: [facility];  Service: General;  Laterality: Right;  Right Modified Radical Mastectomy and Left Simple Mastectomy  . Simple mastectomy with axillary  sentinel node biopsy  06/28/2012    Procedure: SIMPLE MASTECTOMY;  Surgeon: Stark Klein, MD;  Location: [facility];  Service: General;  Laterality: Left;  . Robotic assisted total hysterectomy with bilateral salpingo oopherectomy N/A 11/13/2012    Procedure: ROBOTIC ASSISTED TOTAL HYSTERECTOMY WITH BILATERAL SALPINGO OOPHORECTOMY;  Surgeon: Lovenia Kim, MD;  Location: [facility];  Service: Gynecology;  Laterality: N/A;  . Abdominal hysterectomy      FAMILY HISTORY Family History  Problem Relation Age of Onset  . Cancer Paternal Aunt 73    Breast cancer (half sibling, related through father)  . Spina bifida Paternal Uncle 0    died around 49-19 months of age  . Cancer Maternal Grandmother 69    colon cancer  . Dementia Maternal Grandfather   . Cancer Paternal Grandmother     breast cancer between 33-42  . Cancer Paternal Grandfather     brain cancer  . Cancer Other     Paternal grandmother's siblings had breast cancer and pancreatic cancer  . Cancer Other 21    maternal great grandmother with breast cancer   the patient's parents are alive, in  their early 10s. The patient is an only child. There is a significant family history of cancer including breast, brain, and colon, detailed in our genetic counselors report. BRCA and P. 53 testing is negative  GYNECOLOGIC HISTORY:  (Reviewed 02/06/2014) Menarche age 83; she is GX P2, first live birth age 70.  She had not had periods for a long time since she had been breast-feeding, but resumed menstruating July 2013.  She stopped having menstrual cycles during neoadjuvant chemotherapy. She is status post hysterectomy and bilateral salpingo-oophorectomy in March 2014.  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 Her husband Elbin Percell Miller "Ed" Quentin Angst. works as a Educational psychologist for [employer]	[place]. Their children are Jody Dunn (2008) and Jody Dunn (2012).   ADVANCED DIRECTIVES: not in place  HEALTH  MAINTENANCE:  (Reviewed 02/06/2014)  History  Substance Use Topics  . Smoking status: Never Smoker   . Smokeless tobacco: Never Used  . Alcohol Use: 2.5 - 3.5 oz/week    5-7 drink(s) per week     Comment: OCCASIONAL     Colonoscopy: never  PAP: 2012/ s/p hysterectomy in March 2014  Bone density: never  Lipid panel:  June 2014, elevated    Allergies  Allergen Reactions  . Zofran [Ondansetron Hcl] Nausea And Vomiting, Other (See Comments) and Rash    Migraine headaches Migraine headaches and vomiting  . Oxycodone Itching and Rash  . Adhesive [Tape] Hives  . Antiseptic Products, Misc.     duraprep-rash  . Clindamycin Hives  . Clindamycin/Lincomycin Hives  . Hydrocodone-Acetaminophen Hives  . Duraprep [employer], Misc.] Hives and Rash  . Penicillins Hives, Nausea And Vomiting and Rash    vomiting    Current Outpatient Prescriptions  Medication Sig Dispense Refill  . lactose hydrous POWD Take by mouth.      . naproxen sodium (ANAPROX) 220 MG tablet Take 220 mg by mouth daily as needed.       . Probiotic Product (PROBIOTIC & ACIDOPHILUS EX ST) CAPS Take 1 capsule by mouth daily.       . tamoxifen (NOLVADEX) 20 MG tablet Take 1 tablet (20 mg total) by mouth daily.  90 tablet  3  . LORazepam (ATIVAN) 0.5 MG tablet Take 0.5-1 tablets (0.25-0.5 mg total) by mouth 2 (two) times daily as needed for anxiety or sleep.  30 tablet  0   No current facility-administered medications for this visit.   Facility-Administered Medications Ordered in Other Visits  Medication Dose Route Frequency Provider Last Rate Last Dose  . sodium chloride 0.9 % injection 10 mL  10 mL Intracatheter PRN Theotis Burrow, PA-C   10 mL at 11/01/12 1134    OBJECTIVE: Young white woman who is appears well  Filed Vitals:   02/06/14 1129  BP: 132/92  Pulse: 95  Temp: 98.4 F (36.9 C)  Resp: 20  Body mass index is 25.9 kg/(m^2).  ECOG:  0 Filed Weights   02/06/14 1129  Weight: 170 lb 4.8 oz (77.248  kg)   Physical Exam: HEENT:  Sclerae anicteric.  Oropharynx clear.  Buccal mucosa is pink and moist. Neck supple, trachea midline.  No thyromegaly palpated. NODES:  No cervical or supraclavicular lymphadenopathy palpated.  BREAST EXAM:  Patient is status post bilateral mastectomies with reconstruction. There no suspicious nodularities and no unusual skin changes.  No evidence of local recurrence. Axillae are benign bilaterally, no palpable lymphadenopathy. LUNGS:  Clear to auscultation bilaterally with good excursion.  No wheezes or rhonchi HEART:  Regular rate and rhythm. No murmur. ABDOMEN:  Soft, nontender. No organomegaly or masses.  Positive and normoactive bowel sounds.  MSK:  No focal spinal tenderness to palpation. Good range of motion bilaterally in the upper extremities.  EXTREMITIES: Nonpitting lymphedema in the right upper extremity.  No additional peripheral edema noted.  SKIN:  Benign with no visible rashes or skin lesions. No excessive ecchymoses or petechiae. No pallor. Good skin turgor. NEURO:  Nonfocal. Well oriented. Slightly anxious affect.    LAB RESULTS: Lab Results  Component Value Date   WBC 5.4 01/30/2014   NEUTROABS 3.3 01/30/2014   HGB 13.4 01/30/2014   HCT 40.4 01/30/2014   MCV 90.2 01/30/2014   PLT 270 01/30/2014       Chemistry      Component Value Date/Time   NA 140 01/30/2014 0846   NA 137 11/10/2012 1444   K 4.2 01/30/2014 0846   K 3.7 11/10/2012 1444   CL 105 01/25/2013 0910   CL 97 11/10/2012 1444   CO2 26 01/30/2014 0846   CO2 26 11/10/2012 1444   BUN 11.5 01/30/2014 0846   BUN 15 11/10/2012 1444   CREATININE 0.9 01/30/2014 0846   CREATININE 0.95 11/10/2012 1444      Component Value Date/Time   CALCIUM 9.1 01/30/2014 0846   CALCIUM 9.7 11/10/2012 1444   ALKPHOS 84 01/30/2014 0846   ALKPHOS 94 11/10/2012 1444   AST 31 01/30/2014 0846   AST 22 11/10/2012 1444   ALT 23 01/30/2014 0846   ALT 18 11/10/2012 1444   BILITOT 0.39 01/30/2014 0846   BILITOT 0.3  11/10/2012 1444         STUDIES: Ct Chest W Contrast Ct Abdomen Pelvis W Contrast 01/30/2014   CLINICAL DATA:  History of breast cancer diagnosed in June 2013 status post bilateral mastectomy as well as chemotherapy and radiation therapy which is now complete.  EXAM: CT CHEST, ABDOMEN, AND PELVIS WITH CONTRAST  TECHNIQUE: Multidetector CT imaging of the chest, abdomen and pelvis was performed following the standard protocol during bolus administration of intravenous contrast.  CONTRAST:  21m OMNIPAQUE IOHEXOL 300 MG/ML SOLN, 1012mOMNIPAQUE IOHEXOL 300 MG/ML SOLN  COMPARISON:  None.  FINDINGS: CT CHEST FINDINGS  Mediastinum: Heart size is normal. There is no significant pericardial fluid, thickening or pericardial calcification. No pathologically enlarged mediastinal, bilateral hilar or internal mammary lymph nodes. Small hiatal hernia.  Lungs/Pleura: Subpleural reticulation in the anterior aspect of the right middle and upper lobes, presumably secondary to prior right-sided breast radiation therapy. No suspicious appearing pulmonary nodules or masses are noted. No acute consolidative airspace disease. No pleural effusions.  Musculoskeletal: There are no aggressive appearing lytic or blastic lesions noted in the visualized portions of the skeleton. Postoperative changes of bilateral modified radical mastectomy and right axillary nodal dissection, as well as bilateral tram flap reconstruction. Architectural distortion of soft tissues in the right axillary region is most compatible with some postoperative scarring. No definite soft tissue mass along the right chest wall to suggest local recurrence of disease. 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  CT ABDOMEN AND PELVIS FINDINGS  Abdomen/Pelvis: The appearance of the liver, gallbladder, pancreas,  spleen, bilateral adrenal glands and bilateral kidneys is unremarkable. No significant volume of ascites. No pneumoperitoneum. No pathologic distention of small bowel. No lymphadenopathy identified  within the abdomen or pelvis. Status post hysterectomy. Ovaries are not confidently identified and may be surgically absent or atrophic. Urinary bladder is nearly completely decompressed, but otherwise unremarkable in appearance.  Musculoskeletal: There are no aggressive appearing lytic or blastic lesions noted in the visualized portions of the skeleton.  IMPRESSION: 1. Status post bilateral modified radical mastectomy and right axillary nodal dissection, without evidence to suggest local recurrence of disease or metastatic disease to the chest, abdomen or pelvis. 2. 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 3. Incidental findings, as above.   Electronically Signed   By: Vinnie Langton M.D.   On: 01/30/2014 14:43       ASSESSMENT: 38 y.o. BRCA-negative [place] woman   (1)  status post right breast biopsy 02/09/2012 for multifocal pT4 cN1, stage IIIB invasive ductal carcinoma. Prognostic profile from one of the 2 masses shows it to be triple positive with an MIB-1 of 30%.   (2)  s/p 4 dose dense cycles of doxorubicin/ cyclophosphamide, followed by 4 cycles of paclitaxel, dose-dense,completed 06/07/2012, given with trastuzumab   (3) trastuzumab given every 3 weeks through early June 2014, discontinued due to a drop in the ejection fraction which has recovered. Final echo 03/12/2013.  (4) s/p Right modified radical mastectomy (with prophylactic left simple mastectomy) 06/28/2012, showing a residual 2.3 cm invasive ductal carcinoma, grade 3, with 0 of 14 lymph nodes involved.  (5)  Received radiation in [place] under the care of Dr. Berton Mount, beginning January 2014 and completed in late  February 2014  (6)  Started tamoxifen March 2014.  Discontinued tamoxifen in November 2014 due to side effects and started on anastrozole in December 2014.  Anastrozole was  discontinued in January due to side effects.  Restarted tamoxifen in February 2015.  (7)  status post total hysterectomy with bilateral salpingo-oophorectomy under the care of Dr. Ronita Hipps, 11/13/2012.  (8)  Status post bilateral breast reconstruction under the care of Dr. Isa Rankin at [place]:  Deshea appears to be doing well with regards to her breast cancer diagnosis, and has no clinical evidence of disease recurrence at this time. Her CTs were unremarkable, and we will await the results of her upcoming bone scan which is scheduled in 2 weeks.   Otherwise, although she does have some side effects associated with the medication, Jody Dunn is comfortable continuing on the tamoxifen as before, and this has been refilled for another year. I have also refilled her Ativan, 0.5 mg, with instructions to take half to one tablet up to twice daily as needed for anxiety or as needed for insomnia. She has not had the prescription filled since September, and appears to be using it very appropriately and sparingly.   We'll plan on seeing Lyberti back in approximately 3 months, primarily to followup once again on her scans, including the bone scan. At that point, if she is doing well, we will likely begin seeing her every 6 months.  The above plan was reviewed in detail with Jody Dunn today. She voices her understanding and agreement with our plan. As always, she knows to call with any problems.   BERRY,AMY PA-C    02/06/2014

## 2014-02-07 ENCOUNTER — Encounter (HOSPITAL_COMMUNITY): Payer: Managed Care, Other (non HMO)

## 2014-02-07 ENCOUNTER — Ambulatory Visit (HOSPITAL_COMMUNITY): Payer: Managed Care, Other (non HMO)

## 2014-02-07 ENCOUNTER — Other Ambulatory Visit (HOSPITAL_COMMUNITY): Payer: Managed Care, Other (non HMO)

## 2014-02-13 ENCOUNTER — Other Ambulatory Visit: Payer: Self-pay | Admitting: Physician Assistant

## 2014-02-17 ENCOUNTER — Encounter: Payer: Self-pay | Admitting: Oncology

## 2014-02-18 ENCOUNTER — Other Ambulatory Visit: Payer: Self-pay | Admitting: *Deleted

## 2014-02-18 ENCOUNTER — Encounter (HOSPITAL_COMMUNITY)
Admission: RE | Admit: 2014-02-18 | Discharge: 2014-02-18 | Disposition: A | Discharge status: Home / Self Care | Payer: Managed Care, Other (non HMO) | Source: Ambulatory Visit | Attending: Oncology | Admitting: Oncology

## 2014-02-18 DIAGNOSIS — C50411 Malignant neoplasm of upper-outer quadrant of right female breast: Secondary | ICD-10-CM

## 2014-02-18 DIAGNOSIS — C50419 Malignant neoplasm of upper-outer quadrant of unspecified female breast: Secondary | ICD-10-CM | POA: Insufficient documentation

## 2014-02-18 MED ORDER — TECHNETIUM TC 99M MEDRONATE IV KIT
25.7000 | PACK | Freq: Once | INTRAVENOUS | Status: AC | PRN
  Administered 2014-02-18: 25.7 via INTRAVENOUS

## 2014-02-18 NOTE — Telephone Encounter (Signed)
Labs drawn at our facility for another Ayo Guarino and will not be visible in [place].  Collaborative nurse has already spoken with patient and worked this out.

## 2014-02-19 ENCOUNTER — Telehealth: Payer: Self-pay | Admitting: *Deleted

## 2014-02-19 NOTE — Telephone Encounter (Signed)
This RN called pt with results of bone scan.  Discussed NED but areas abnormalities including: Right rib- pt has known area of fracture post breast reconstruction. Left knee area corresponds to known knee surgery. Right ankle area - Alexzandria states " I fell in January and twisted it pretty bad "  Discussed proceeding with xrays.  At present plan is for MD to review before proceeding to further scans.  This note with results will be given to MD.

## 2014-02-24 ENCOUNTER — Other Ambulatory Visit: Payer: Self-pay | Admitting: Oncology

## 2014-02-25 ENCOUNTER — Other Ambulatory Visit: Payer: Self-pay | Admitting: *Deleted

## 2014-02-26 ENCOUNTER — Ambulatory Visit (HOSPITAL_COMMUNITY): Payer: Managed Care, Other (non HMO)

## 2014-02-26 ENCOUNTER — Other Ambulatory Visit: Payer: Managed Care, Other (non HMO)

## 2014-02-28 ENCOUNTER — Ambulatory Visit: Payer: Managed Care, Other (non HMO) | Admitting: Oncology

## 2014-05-03 ENCOUNTER — Telehealth: Payer: Self-pay | Admitting: Oncology

## 2014-05-03 NOTE — Telephone Encounter (Signed)
, °

## 2014-05-07 ENCOUNTER — Other Ambulatory Visit: Payer: Self-pay | Admitting: Emergency Medicine

## 2014-05-09 ENCOUNTER — Encounter: Payer: Self-pay | Admitting: Nurse Practitioner

## 2014-05-09 ENCOUNTER — Ambulatory Visit: Payer: Managed Care, Other (non HMO) | Admitting: Oncology

## 2014-05-09 ENCOUNTER — Other Ambulatory Visit: Payer: Managed Care, Other (non HMO)

## 2014-05-09 ENCOUNTER — Ambulatory Visit (HOSPITAL_BASED_OUTPATIENT_CLINIC_OR_DEPARTMENT_OTHER): Payer: Managed Care, Other (non HMO) | Admitting: Nurse Practitioner

## 2014-05-09 ENCOUNTER — Other Ambulatory Visit (HOSPITAL_BASED_OUTPATIENT_CLINIC_OR_DEPARTMENT_OTHER): Payer: Managed Care, Other (non HMO)

## 2014-05-09 ENCOUNTER — Telehealth: Payer: Self-pay | Admitting: Oncology

## 2014-05-09 VITALS — BP 140/90 | HR 103 | Temp 98.6°F | Resp 18 | Ht 68.0 in | Wt 172.6 lb

## 2014-05-09 DIAGNOSIS — G4700 Insomnia, unspecified: Secondary | ICD-10-CM

## 2014-05-09 DIAGNOSIS — Z170 Estrogen receptor positive status [ER+]: Secondary | ICD-10-CM

## 2014-05-09 DIAGNOSIS — R232 Flushing: Secondary | ICD-10-CM

## 2014-05-09 DIAGNOSIS — R61 Generalized hyperhidrosis: Secondary | ICD-10-CM

## 2014-05-09 DIAGNOSIS — Z853 Personal history of malignant neoplasm of breast: Secondary | ICD-10-CM

## 2014-05-09 DIAGNOSIS — T451X5A Adverse effect of antineoplastic and immunosuppressive drugs, initial encounter: Principal | ICD-10-CM

## 2014-05-09 DIAGNOSIS — C50419 Malignant neoplasm of upper-outer quadrant of unspecified female breast: Secondary | ICD-10-CM

## 2014-05-09 LAB — CBC WITH DIFFERENTIAL/PLATELET
BASO%: 0.7 % (ref 0.0–2.0)
Basophils Absolute: 0 10*3/uL (ref 0.0–0.1)
EOS ABS: 0.1 10*3/uL (ref 0.0–0.5)
EOS%: 1.7 % (ref 0.0–7.0)
HEMATOCRIT: 38.8 % (ref 34.8–46.6)
HGB: 12.8 g/dL (ref 11.6–15.9)
LYMPH%: 27.4 % (ref 14.0–49.7)
MCH: 30.9 pg (ref 25.1–34.0)
MCHC: 32.9 g/dL (ref 31.5–36.0)
MCV: 93.9 fL (ref 79.5–101.0)
MONO#: 0.5 10*3/uL (ref 0.1–0.9)
MONO%: 6.9 % (ref 0.0–14.0)
NEUT%: 63.3 % (ref 38.4–76.8)
NEUTROS ABS: 4.2 10*3/uL (ref 1.5–6.5)
Platelets: 238 10*3/uL (ref 145–400)
RBC: 4.14 10*6/uL (ref 3.70–5.45)
RDW: 12.7 % (ref 11.2–14.5)
WBC: 6.6 10*3/uL (ref 3.9–10.3)
lymph#: 1.8 10*3/uL (ref 0.9–3.3)

## 2014-05-09 LAB — COMPREHENSIVE METABOLIC PANEL
ALBUMIN: 4.1 g/dL (ref 3.5–5.2)
ALT: 26 U/L (ref 0–35)
AST: 30 U/L (ref 0–37)
Alkaline Phosphatase: 65 U/L (ref 39–117)
BILIRUBIN TOTAL: 0.3 mg/dL (ref 0.2–1.2)
BUN: 14 mg/dL (ref 6–23)
CALCIUM: 9 mg/dL (ref 8.4–10.5)
CHLORIDE: 102 meq/L (ref 96–112)
CO2: 24 meq/L (ref 19–32)
Creatinine, Ser: 0.89 mg/dL (ref 0.50–1.10)
GLUCOSE: 90 mg/dL (ref 70–99)
Potassium: 4 mEq/L (ref 3.5–5.3)
SODIUM: 136 meq/L (ref 135–145)
TOTAL PROTEIN: 7.1 g/dL (ref 6.0–8.3)

## 2014-05-09 MED ORDER — GABAPENTIN 300 MG PO CAPS: 300.0000 mg | ORAL_CAPSULE | Freq: Every day | ORAL | Status: DC

## 2014-05-09 NOTE — Progress Notes (Signed)
ID: [name]   DOB: 1975/11/08  MR#: [id number]  OVF#:[id number]  PCP: Artelia Laroche, CNM GYN: Brien Few, MD  SU: Coralie Keens, MD OTHER MD: Berton Mount, MD;   Stevphen Rochester, MD [facility]);  Stark Klein, MD  CHIEF COMPLAINT:  Hx of Right Breast Cancer CURRENT TREATMENT: tamoxifen 29m daily  BREAST CANCER HISTORY: [facility] pains in the right breast. This found no worrisome finding. In the spring of 2012, the patient at delivered her second child. She nursed but the child refused to take milk from the right breast. The patient tells me that she has been able to pump easily from the right breast, so production is not an issue.   In early June 2013, the patient felt a new lump in the right breast, and brought this to the [place] [facility] This found at the breast tissue to be heterogeneously dense, which is not a surprise given that the patient is lactating. There was diffuse skin thickening. Erythema is not described. The nipple was retracted. There was no definitive peau d'orange appearance. Pleomorphic calcifications in the lateral right breast extended approximately 11 cm, without definite mass. On ultrasound there was a vague irregular hypoechoic mass measuring approximately 15 mm in the right breast, with a second ill-defined hypoechoic lobulated mass measuring approximately 17 mm.   Both these masses were biopsied 02/09/2012, and the final pathology ([id number]) showed both IIb invasive ductal carcinoma, with a prognostic panel (from the mass at 11:00) as follows: estrogen receptor positive at 90%, progesterone receptor positive at 90%, Mib-1 of 30%, and HER-2 amplification with a ratio of 4.29 by CISH. MRI of the breast was performed 02/15/2012 and showed the mass in the 10:00 position to measure 3.1 cm, the one at the 11:00 position to  measure 3.1 cm. There was diffuse skin thickening in the right breast and 2 enhancing abnormal-appearing lower right axillary lymph nodes measuring 2.0 and 1.8 cm respectively. There was no evidence for internal mammary adenopathy, left axillary adenopathy, or significant findings in the left breast.  Subsequent treatment is as detailed below.  INTERVAL HISTORY: JLeonardreturns for follow up of her right breast cancer. She restarted tamoxifen February of this year (after trying anastrozole for some time with worse side effects.) She continues to dislike the tamoxifen, but states that so far she can "deal with it." She continues to have terrible hot flashes that interrupt her sleep. She has mood swings and anxiety that is only minimally helped with ativan. She takes the ativan infrequently, typically less than 1 whole tablet because of the associated drowsiness. She has tried venlafaxine, but didn't like how that makes her feel. She is active in a breast cancer facebook support group and she finds venting to these women helpful.  REVIEW OF SYSTEMS: JElvetadenies fevers, chills, nausea, or vomiting, but occasionally has diarrhea. She believes this may be anxiety related. She has no shortness of breath, chest pain, palpitations, or cough. Her energy level is fine, despite not sleeping well. She has no vaginal changes that she has noticed. A detailed review of systems was otherwise noncontributory.  PAST MEDICAL HISTORY: Past Medical History  Diagnosis Date  . Iron (Fe) deficiency anemia   . Asthma     exercise induced  . Back pain   . Migraines   . Lactose intolerance   . Hyperlipidemia   .  Allergy   . Anxiety 09/18/2013  . Breast cancer   . Cancer     PAST SURGICAL HISTORY: Past Surgical History  Procedure Laterality Date  . Appendectomy    . Shoulder surgery    . Knee surgery      X2  . Portacath placement  02/21/2012    Procedure: INSERTION PORT-A-CATH;  Surgeon: Stark Klein, MD;   Location: [facility];  Service: General;  Laterality: N/A;  . Skin biopsy  02/21/2012    Procedure: BIOPSY SKIN;  Surgeon: Stark Klein, MD;  Location: [facility];  Service: General;  Laterality: Right;  . Breast biopsy  02/09/2012  . Mastectomy modified radical  06/28/2012    Procedure: MASTECTOMY MODIFIED RADICAL;  Surgeon: Stark Klein, MD;  Location: [facility];  Service: General;  Laterality: Right;  Right Modified Radical Mastectomy and Left Simple Mastectomy  . Simple mastectomy with axillary sentinel node biopsy  06/28/2012    Procedure: SIMPLE MASTECTOMY;  Surgeon: Stark Klein, MD;  Location: [facility];  Service: General;  Laterality: Left;  . Robotic assisted total hysterectomy with bilateral salpingo oopherectomy N/A 11/13/2012    Procedure: ROBOTIC ASSISTED TOTAL HYSTERECTOMY WITH BILATERAL SALPINGO OOPHORECTOMY;  Surgeon: Lovenia Kim, MD;  Location: [facility];  Service: Gynecology;  Laterality: N/A;  . Abdominal hysterectomy      FAMILY HISTORY Family History  Problem Relation Age of Onset  . Cancer Paternal Aunt 48    Breast cancer (half sibling, related through father)  . Spina bifida Paternal Uncle 0    died around 78-1 months of age  . Cancer Maternal Grandmother 22    colon cancer  . Dementia Maternal Grandfather   . Cancer Paternal Grandmother     breast cancer between 39-42  . Cancer Paternal Grandfather     brain cancer  . Cancer Other     Paternal grandmother's siblings had breast cancer and pancreatic cancer  . Cancer Other 12    maternal great grandmother with breast cancer   the patient's parents are alive, in their early 44s. The patient is an only child. There is a significant family history of cancer including breast, brain, and colon, detailed in our genetic counselors report. BRCA and P. 53 testing is negative  GYNECOLOGIC HISTORY:  (Reviewed 02/06/2014) Menarche age 83; she is GX P2, first live birth age 84.  She had not had periods for a long time since she had been  breast-feeding, but resumed menstruating July 2013.  She stopped having menstrual cycles during neoadjuvant chemotherapy. She is status post hysterectomy and bilateral salpingo-oophorectomy in March 2014.  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 Her husband Elbin Percell Miller "Ed" Quentin Angst. works as a Educational psychologist for [employer]	[place]. Their children are Dyann Kief (2008) and [name] (2012).   ADVANCED DIRECTIVES: not in place  HEALTH MAINTENANCE:  (Reviewed 02/06/2014)  History  Substance Use Topics  . Smoking status: Never Smoker   . Smokeless tobacco: Never Used  . Alcohol Use: 2.5 - 3.5 oz/week    5-7 drink(s) per week     Comment: OCCASIONAL     Colonoscopy: never  PAP: 2012/ s/p hysterectomy in March 2014  Bone density: never  Lipid panel:  June 2014, elevated    Allergies  Allergen Reactions  . Zofran [Ondansetron Hcl] Nausea And Vomiting, Other (See Comments) and Rash    Migraine headaches Migraine headaches and vomiting  . Oxycodone Itching and Rash  .  Adhesive [Tape] Hives  . Antiseptic Products, Misc.     duraprep-rash  . Clindamycin Hives  . Clindamycin/Lincomycin Hives  . Hydrocodone-Acetaminophen Hives  . Duraprep [employer], Misc.] Hives and Rash  . Penicillins Hives, Nausea And Vomiting and Rash    vomiting    Current Outpatient Prescriptions  Medication Sig Dispense Refill  . lactose hydrous POWD Take by mouth.      Marland Kitchen LORazepam (ATIVAN) 0.5 MG tablet Take 0.5-1 tablets (0.25-0.5 mg total) by mouth 2 (two) times daily as needed for anxiety or sleep.  30 tablet  0  . naproxen sodium (ANAPROX) 220 MG tablet Take 220 mg by mouth daily as needed.       . Probiotic Product (PROBIOTIC & ACIDOPHILUS EX ST) CAPS Take 1 capsule by mouth daily.       . tamoxifen (NOLVADEX) 20 MG tablet Take 1 tablet (20 mg total) by mouth daily.  90 tablet  3  . gabapentin (NEURONTIN) 300 MG capsule Take 1 capsule (300  mg total) by mouth at bedtime.  90 capsule  2   No current facility-administered medications for this visit.   Facility-Administered Medications Ordered in Other Visits  Medication Dose Route Frequency Provider Last Rate Last Dose  . sodium chloride 0.9 % injection 10 mL  10 mL Intracatheter PRN Theotis Burrow, PA-C   10 mL at 11/01/12 1134    OBJECTIVE: Young white woman who is appears well  Filed Vitals:   05/09/14 1142  BP: 140/90  Pulse: 103  Temp: 98.6 F (37 C)  Resp: 18  Body mass index is 26.25 kg/(m^2).  ECOG:  0 Filed Weights   05/09/14 1142  Weight: 172 lb 9.6 oz (78.291 kg)   Physical Exam: Skin: warm, dry  HEENT: sclerae anicteric, conjunctivae pink, oropharynx clear. No thrush or mucositis.  Lymph Nodes: No cervical or supraclavicular lymphadenopathy  Lungs: clear to auscultation bilaterally, no rales, wheezes, or rhonci  Heart: regular rate and rhythm  Abdomen: round, soft, non tender, positive bowel sounds, incision scars from oophorectomy healed and intact Musculoskeletal: No focal spinal tenderness, no peripheral edema  Neuro: non focal, well oriented, positive affect  Breasts: status post bilateral mastectomies and reconstruction. No evidence of local recurrence. Bilateral axillae benign.   LAB RESULTS: Lab Results  Component Value Date   WBC 6.6 05/09/2014   NEUTROABS 4.2 05/09/2014   HGB 12.8 05/09/2014   HCT 38.8 05/09/2014   MCV 93.9 05/09/2014   PLT 238 05/09/2014       Chemistry      Component Value Date/Time   NA 140 01/30/2014 0846   NA 137 11/10/2012 1444   K 4.2 01/30/2014 0846   K 3.7 11/10/2012 1444   CL 105 01/25/2013 0910   CL 97 11/10/2012 1444   CO2 26 01/30/2014 0846   CO2 26 11/10/2012 1444   BUN 11.5 01/30/2014 0846   BUN 15 11/10/2012 1444   CREATININE 0.9 01/30/2014 0846   CREATININE 0.95 11/10/2012 1444      Component Value Date/Time   CALCIUM 9.1 01/30/2014 0846   CALCIUM 9.7 11/10/2012 1444   ALKPHOS 84 01/30/2014 0846   ALKPHOS 94  11/10/2012 1444   AST 31 01/30/2014 0846   AST 22 11/10/2012 1444   ALT 23 01/30/2014 0846   ALT 18 11/10/2012 1444   BILITOT 0.39 01/30/2014 0846   BILITOT 0.3 11/10/2012 1444       STUDIES: No results found.   ASSESSMENT: 38 y.o. BRCA-negative  [place] woman   (1)  status post right breast biopsy 02/09/2012 for multifocal pT4 cN1, stage IIIB invasive ductal carcinoma. Prognostic profile from one of the 2 masses shows it to be triple positive with an MIB-1 of 30%.   (2)  s/p 4 dose dense cycles of doxorubicin/ cyclophosphamide, followed by 4 cycles of paclitaxel, dose-dense,completed 06/07/2012, given with trastuzumab   (3) trastuzumab given every 3 weeks through early June 2014, discontinued due to a drop in the ejection fraction which has recovered. Final echo 03/12/2013.  (4) s/p Right modified radical mastectomy (with prophylactic left simple mastectomy) 06/28/2012, showing a residual 2.3 cm invasive ductal carcinoma, grade 3, with 0 of 14 lymph nodes involved.  (5)  Received radiation in [place] under the care of Dr. Berton Mount, beginning January 2014 and completed in late February 2014  (6)  Started tamoxifen March 2014.  Discontinued tamoxifen in November 2014 due to side effects and started on anastrozole in December 2014.  Anastrozole was  discontinued in January due to side effects.  Restarted tamoxifen in February 2015.  (7)  status post total hysterectomy with bilateral salpingo-oophorectomy under the care of Dr. Ronita Hipps, 11/13/2012.  (8)  Status post bilateral breast reconstruction under the care of Dr. Isa Rankin at [place]:  Eloisa is doing well as far as her breast cancer is concerned. There is no evidence of recurrent disease. The CBC was reviewed in detail with her and was entirely normal. The CMET was not available to review at this time. Despite all of her complaints with the tamoxifen, she would like to continue the drug, with the goal of treating for at  least 5 years.   I think she could benefit from 318m gabapentin QHS to treat the hot flashes that occur at night. She may be able to sleep better without that interruption. We discussed its main side effect of drowsiness, which may actually be of benefit to her insomnia. This drug was e-prescribed during this visit.   JDesreewill return for labs and an office visit in 6 months. She understands and agrees with this plan. She has been encouraged to call with any issues that might arise before her next visit here.   FMarcelino Duster NP    05/09/2014

## 2014-06-24 ENCOUNTER — Encounter: Payer: Self-pay | Admitting: Nurse Practitioner

## 2014-06-26 ENCOUNTER — Telehealth: Payer: Self-pay | Admitting: *Deleted

## 2014-06-26 DIAGNOSIS — R0781 Pleurodynia: Secondary | ICD-10-CM

## 2014-06-26 DIAGNOSIS — C50411 Malignant neoplasm of upper-outer quadrant of right female breast: Secondary | ICD-10-CM

## 2014-06-26 NOTE — Telephone Encounter (Signed)
This RN spoke with pt per her concern due to ongoing rib pain with hx of fracture in June 2015.  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  Plan at present is pt will obtain an XRAY for review of known area of discomfort.

## 2014-06-27 ENCOUNTER — Other Ambulatory Visit: Payer: Self-pay | Admitting: *Deleted

## 2014-06-27 DIAGNOSIS — C50411 Malignant neoplasm of upper-outer quadrant of right female breast: Secondary | ICD-10-CM

## 2014-07-01 ENCOUNTER — Ambulatory Visit (HOSPITAL_COMMUNITY): Payer: Managed Care, Other (non HMO)

## 2014-07-01 ENCOUNTER — Ambulatory Visit (HOSPITAL_COMMUNITY)
Admission: RE | Admit: 2014-07-01 | Discharge: 2014-07-01 | Disposition: A | Discharge status: Home / Self Care | Payer: Managed Care, Other (non HMO) | Source: Ambulatory Visit | Attending: Oncology | Admitting: Oncology

## 2014-07-01 ENCOUNTER — Other Ambulatory Visit: Payer: Self-pay | Admitting: Emergency Medicine

## 2014-07-01 DIAGNOSIS — C50911 Malignant neoplasm of unspecified site of right female breast: Secondary | ICD-10-CM | POA: Insufficient documentation

## 2014-07-01 DIAGNOSIS — R0781 Pleurodynia: Secondary | ICD-10-CM

## 2014-07-01 DIAGNOSIS — M79672 Pain in left foot: Secondary | ICD-10-CM | POA: Insufficient documentation

## 2014-07-01 DIAGNOSIS — C50411 Malignant neoplasm of upper-outer quadrant of right female breast: Secondary | ICD-10-CM

## 2014-10-09 ENCOUNTER — Telehealth: Payer: Self-pay | Admitting: *Deleted

## 2014-10-09 NOTE — Telephone Encounter (Signed)
This RN spoke with pt per her discussion with Kaleen Odea and issues related to current therapy and quality of life.  Per concerns mentioned were " medication makes me feel terrible " " I have no libido " As well as other issues secondary to post chemo for breast cancer and on antiestrogen.  This RN spoke with MD and Mike Craze NP -  Per MD okay to see Elzie Rings to discuss above issues and interventions for benefit.  This RN spoke with pt per above - she is agreeable to plan.

## 2014-10-10 ENCOUNTER — Telehealth: Payer: Self-pay | Admitting: Adult Health

## 2014-10-10 NOTE — Telephone Encounter (Signed)
I called Jody Dunn at the request of Marlon Pel, RN in the [facility] to schedule a visit in the Survivorship Clinic to address some of the survivorship concerns Jody Dunn has expressed to [facility] on the phone.    I had a lengthy discussion with Jody Dunn in which she expressed to me many of her concerns with regards to her adjuvant anti-estrogen treatment with Tamoxifen.  She is willing to come see me in the Survivorship Clinic and we scheduled an appointment on Tuesday, 10/15/14 to discuss her concerns as a cancer survivor.   I gave her my direct office phone number and encouraged her to call me if she had any additional concerns before I see her in clinic on Tuesday.  She expressed verbal understanding and appreciation.  I look forward to participating in her care.   Mike Craze, NP [place] [phone]

## 2014-10-15 ENCOUNTER — Ambulatory Visit (HOSPITAL_BASED_OUTPATIENT_CLINIC_OR_DEPARTMENT_OTHER): Payer: Managed Care, Other (non HMO) | Admitting: Adult Health

## 2014-10-15 ENCOUNTER — Encounter: Payer: Self-pay | Admitting: Adult Health

## 2014-10-15 VITALS — BP 136/96 | HR 98 | Temp 98.0°F | Resp 18

## 2014-10-15 DIAGNOSIS — N951 Menopausal and female climacteric states: Secondary | ICD-10-CM

## 2014-10-15 DIAGNOSIS — I890 Lymphedema, not elsewhere classified: Secondary | ICD-10-CM

## 2014-10-15 DIAGNOSIS — F39 Unspecified mood [affective] disorder: Secondary | ICD-10-CM

## 2014-10-15 DIAGNOSIS — C50911 Malignant neoplasm of unspecified site of right female breast: Secondary | ICD-10-CM

## 2014-10-15 DIAGNOSIS — F419 Anxiety disorder, unspecified: Secondary | ICD-10-CM

## 2014-10-15 DIAGNOSIS — Z803 Family history of malignant neoplasm of breast: Secondary | ICD-10-CM

## 2014-10-15 DIAGNOSIS — E8941 Symptomatic postprocedural ovarian failure: Secondary | ICD-10-CM

## 2014-10-15 DIAGNOSIS — C50411 Malignant neoplasm of upper-outer quadrant of right female breast: Secondary | ICD-10-CM

## 2014-10-15 DIAGNOSIS — G4700 Insomnia, unspecified: Secondary | ICD-10-CM

## 2014-10-15 MED ORDER — CITALOPRAM HYDROBROMIDE 20 MG PO TABS: 20.0000 mg | ORAL_TABLET | Freq: Every day | ORAL | Status: DC

## 2014-10-15 MED ORDER — BUSPIRONE HCL 7.5 MG PO TABS: 7.5000 mg | ORAL_TABLET | Freq: Every evening | ORAL | Status: DC | PRN

## 2014-10-15 NOTE — Progress Notes (Signed)
CLINIC:  Cancer Survivorship   REASON FOR VISIT:  Routine follow-up post-treatment for a recent history of breast cancer.  ONCOLOGIC HISTORY:    Breast cancer of upper-outer quadrant of right female breast    Cancer of upper-outer quadrant of female breast, right breast.  T1XB2I2   02/07/2012 Breast US Diffuse skin thickening, nipple retraction;  Right breast pleomorphic calcs extending 11cm. At 10 o'clock:  irregular hypoechoic mass, 17x35m. At 11 o'clock: irregular hypoechoic mass 143m adjacent hypoechoic lobulated mass 1676m  02/09/2012 Initial Biopsy Right needle core biopsy (10 o'clock): IDC & DCIS.  (11 o'clock): IDC & DCIS, LVI identified. Grade 2-3, similar in both specimens. ER+ (90%), PR+ (90%), HER2/neu+ by CISH (ratio 4.29). Ki-67: 30%.    02/09/2012 Initial Diagnosis Cancer of upper-outer quadrant of female breast, right breast.  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  Right breast 11 o'clock position irregular enhancing mass measuring 2.9 x 3.5 x 2.3 cm. 2 adjacent axillary LNs worrisome for mets.    02/18/2012 Miscellaneous Genetics testing negative. Genes tested include:  BreastPlus panel BRCA1/2, CDH1, STK11, PTEN and TP53.  Pt declined further testing with BreastNext panel.   02/23/2012 Echocardiogram Pre-treatment EF: 60%     02/28/2012 Imaging Staging PET: Multi-focal hypermetabolism (SUV max 8.8) in right breast with hypermetabolic right axillary adenopathy (SUV max 6.3). No other foci of hypermetabolism concerning for malignancy.    02/28/2012 Imaging Staging CT chest: Areas of irregular soft tissue in right breast with overlying skin thickening and right axillary adenopathy (lymph nodes measure up to 1.2 cm). No evidence of intrathoracic mets.    03/01/2012 - 04/12/2012 Chemotherapy Dose-dense  Adriamycin, Cytoxan x 4 cycles    04/26/2012 - 06/07/2012 Chemotherapy Dose-dense Taxol/Trastuzimab x 4 cycles.    04/26/2012 - 01/25/2013 Chemotherapy Unable to complete full year of adjuvant Herceptin due to decreased EF 45-50% noted on 02/12/13. Decision to stop further maintenance Herceptin was made. EF recovered to 55%.   06/28/2012 Surgery Right modified radical mastectomy, grade 3 IDC, 2.3 cm, +LVI, associated grade 3 DCIS. Negative margins. 14 total LNs removed, all benign.  Left simple mastectomy: benign breast tissue.    08/30/2012 - 10/20/2012 Radiation Therapy [place] Gle[place]oreene Filbert  10/21/2012 -  Anti-estrogen oral therapy Started Tamoxifen. Stopped in 06/2013 due to side effects.  Started Anastrazole in 07/2013, but was stopped in 08/2013 due to side effects.  Restarted Tamoxifen in 09/2013, stopped in 07/2014 by patient due to side effects.     11/13/2012 Surgery TAH/BSO performed: Unremarkable pathology; no hyperplasia, carcinoma, endometriosis, dysplasia, or malignancy.    03/12/2013 Echocardiogram Post-treatment EF: 55%   11/02/2013 Surgery Plastic Surgery-Dr. RouKerrie Pleasureevision of left reconstructed breast, B NAC reconstruction, fat grafting to bilateral breasts with 3 donor sites, steroid (Kenalog) injection to RIQ breast scar    01/30/2014 Imaging Restaging CT c/a/p: No evidence of local recurrence of disease or metastatic disease in chest, abdomen, or pelvis. Small focus on right middle rib (thought to be healing fracture).    02/18/2014 Imaging Restaging Bone Scan: Small focus of uptake in right middle rib (healing fracture), small focus of uptake in left knee and right ankle (degenerative changes).     INTERVAL HISTORY:  Ms. BunGilhamesents to the Survivorship Clinic today  for our initial meeting to review her survivorship care plan detailing her treatment course for breast cancer, as well as monitoring long-term side effects of that  treatment, education regarding health maintenance, screening, and overall wellness and health promotion.    Overall, Ms. Anagnos reports feeling fatigue, insomnia, mood swings/anxiety, hot flashes, and fear of cancer recurrence.  Her fatigue has been persistent, but she is still able to work and perform ADLs independently.  With regards to sleep, she reports having difficulty staying asleep, rather than falling asleep.  She attributes some of her sleep disturbance to hot flashes, but also to anxiety at times.  She reports having 5+ hot flashes per day that are very bothersome for her.  Lastly, she reports that when she is not taking the Tamoxifen (she self-stopped this medication in 07/2014 due to side effects), her anxiety and mood swings are "much better."  While on the Tamoxifen she said that she experiences more frequent angry outbursts and "rage", as well as worsening anxiety.  She reports only taking the Ativan 1-2 times/week.  However, she continues to have worries/fears of cancer recurrence and states that many of her friends in her Facebook support group have been diagnosed with progression of disease and are not doing well and this worries her regarding her risk of recurrence in the future. She reports that her husband is supportive and she finds great comfort and support from her close group of girlfriends.   REVIEW OF SYSTEMS:  General: Denies fever, chills, unintentional weight loss. Endorses fatigue per HPI.  Cardiac: Denies palpitations, chest pain, and lower extremity edema.  Respiratory: Denies cough, shortness of breath, and dyspnea on exertion.  GI: Denies abdominal pain, constipation, diarrhea, nausea, or vomiting.  GU: Denies dysuria, hematuria, vaginal bleeding, vaginal discharge, or vaginal dryness.  Musculoskeletal: Denies joint or bone pain.  Neuro: Denies headache or recent falls. Denies peripheral neuropathy. Skin: Denies rash, pruritis, or open wounds.  Breast: Denies any new  nodularity, masses, tenderness, nipple changes, or nipple discharge.  Psych: Anxiety per HPI.   A 14-point review of systems was completed and was negative, except as noted above.   ONCOLOGY TREATMENT TEAM:  1. Surgeon:  Dr. Barry Dienes at [facility] 2. Medical Oncologist: Dr. Jana Hakim 3. Radiation Oncologist: Dr. Noreene Filbert in [place], [place]. 4. Plastic Surgeon: Dr. Isa Rankin at [facility] MEDICAL/SURGICAL HISTORY:  Past Medical History  Diagnosis Date  . Iron (Fe) deficiency anemia   . Asthma     exercise induced  . Back pain   . Migraines   . Lactose intolerance   . Hyperlipidemia   . Allergy   . Anxiety 09/18/2013  . Breast cancer   . Cancer    Past Surgical History  Procedure Laterality Date  . Appendectomy    . Shoulder surgery    . Knee surgery      X2  . Portacath placement  02/21/2012    Procedure: INSERTION PORT-A-CATH;  Surgeon: Stark Klein, MD;  Location: [facility];  Service: General;  Laterality: N/A;  . Skin biopsy  02/21/2012    Procedure: BIOPSY SKIN;  Surgeon: Stark Klein, MD;  Location: [facility];  Service: General;  Laterality: Right;  . Breast biopsy  02/09/2012  . Mastectomy modified radical  06/28/2012    Procedure: MASTECTOMY MODIFIED RADICAL;  Surgeon: Stark Klein, MD;  Location: [facility];  Service: General;  Laterality: Right;  Right Modified Radical Mastectomy and Left Simple Mastectomy  . Simple mastectomy  with axillary sentinel node biopsy  06/28/2012    Procedure: SIMPLE MASTECTOMY;  Surgeon: Stark Klein, MD;  Location: [facility];  Service: General;  Laterality: Left;  . Robotic assisted total hysterectomy with bilateral salpingo oopherectomy N/A 11/13/2012    Procedure: ROBOTIC ASSISTED TOTAL HYSTERECTOMY WITH BILATERAL SALPINGO OOPHORECTOMY;  Surgeon: Lovenia Kim, MD;  Location: [facility];  Service: Gynecology;  Laterality: N/A;  . Abdominal hysterectomy          CURRENT MEDICATIONS:  Outpatient Encounter Prescriptions as  of 10/15/2014  Medication Sig Note  . lactose hydrous POWD Take by mouth. 02/06/2014: Received from: [facility]  . LORazepam (ATIVAN) 0.5 MG tablet Take 0.5-1 tablets (0.25-0.5 mg total) by mouth 2 (two) times daily as needed for anxiety or sleep.   . naproxen sodium (ANAPROX) 220 MG tablet Take 220 mg by mouth daily as needed.    . busPIRone (BUSPAR) 7.5 MG tablet Take 1 tablet (7.5 mg total) by mouth at bedtime as needed (For anxiety).   . citalopram (CELEXA) 20 MG tablet Take 1 tablet (20 mg total) by mouth daily. Take 0.5 tab (61m) for first 7 days. Then increase dose to 1 tab (282m daily.   . Marland Kitchenabapentin (NEURONTIN) 300 MG capsule Take 1 capsule (300 mg total) by mouth at bedtime.   . Probiotic Product (PROBIOTIC & ACIDOPHILUS EX ST) CAPS Take 1 capsule by mouth daily.    . tamoxifen (NOLVADEX) 20 MG tablet Take 1 tablet (20 mg total) by mouth daily.   . [DISCONTINUED] citalopram (CELEXA) 20 MG tablet Take 1 tablet (20 mg total) by mouth daily.   . [DISCONTINUED] sodium chloride 0.9 % injection 10 mL       ONCOLOGIC FAMILY HISTORY:  1. Paternal aunt (father's half-sister): Breast cancer, diagnosed at age 37  2Maternal grandmother: Colon cancer 3. Paternal grandmother: Breast cancer, diagnosed between ages 3866-424. Paternal grandfather: Brain cancer 5. Paternal grandmother's siblings: Breast cancer and pancreatic cancer 6. Maternal great grandmother: Breast cancer     SOCIAL HISTORY:  JaLILYIAN QUAYLEs married and lives [place] She has one son, AbDyann Kiefho is 8 89ears old and one daughter, ReEugene Garnetwho is 4 75ears old.  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  Her husband, Ed, is a prEducational psychologistor [employer] She denies any current or history of tobacco or illicit drug use.  She endorses occasional alcoholic beverages with friends on social occasions, and she and her  husband make their own wine in their home.    PHYSICAL EXAMINATION:  Vital Signs:   Filed Vitals:   10/15/14 1215  BP: 136/96  Pulse: 98  Temp: 98 F (36.7 C)  Resp: 18   General: Well-nourished, well-appearing female in no acute distress.  She is unaccompanied in clinic today.   HEENT: Head is atraumatic and normocephalic.  Pupils equal and reactive to light and accomodation. Conjunctivae clear without exudate.  Sclerae anicteric. Oral mucosa is pink, moist, and intact without lesions.  Oropharynx is pink without lesions or erythema.  Lymph: No cervical, supraclavicular, infraclavicular, or axillary lymphadenopathy noted on palpation.  Cardiovascular: Regular rate and rhythm without murmurs, rubs, or gallops. Respiratory: Clear to auscultation bilaterally. Chest expansion symmetric without accessory muscle use on inspiration or expiration.  GI: Abdomen soft and round. No tenderness to palpation. Bowel sounds normoactive in 4 quadrants. No hepatosplenomegaly.  GU: Deferred.  Musculoskeletal: Muscle strength 5/5 in all extremities. Full ROM noted in all extremities.  Neuro: No focal deficits. Steady gait.  Psych: Slightly anxious affect. Tearful several times during visit today.  Extremities: Mild right arm/hand lymphedema. Otherwise, no evidence of edema, cyanosis, or clubbing in extremities.  Skin: Warm and dry. No open lesions noted.   LABORATORY DATA:  None for this visit.   DIAGNOSTIC IMAGING:  None for this visit.     ASSESSMENT AND PLAN:   1. History of breast cancer:  Ms. Kitts will follow-up with her medical oncologist, Dr. Jana Hakim in March 2016 with history and physical exam per surveillance protocol.  She is not currently taking the prescribed Tamoxifen due to her reported adverse side effects (mood swings, anger/"rage", anxiety, hot flashes).  Dr. Jana Hakim will discuss further anti-estrogen pharmacologic options for her at her next visit with him in March 2016.   Dr. Jana Hakim and I both agree that we have to help manage her current symptom burden before restarting any anti-estrogen therapy.  I discussed at length with the patient the common side effects of menopause and how different hormones play a role in her symptoms.  We also reviewed her comprehensive survivorship care plan and treatment summary today detailing her breast cancer diagnosis, treatment course, potential late/long-term effects of treatment, appropriate follow-up care with recommendations for the future, and patient education resources.  A copy of this summary, along with a letter will be sent to the patient's primary care provider after today's visit. I would like to see her back in the Survivorship Clinic in approximately 6 weeks to re-evaluate her symptoms.  She will see Dr. Jana Hakim in the interim.    2. Hot flashes:  Given that Ms. Renier is s/p bilateral risk reduction salpingo-oopherectomy, she is now post-menopausal and experiencing several bothersome symptoms.  Her reported hot flashes are severe enough in her perception that they are disturbing her daily life and her ability to get a good night's sleep.  Therefore, we talked extensively about potential pharmacologic options (as well as behavioral modifications) that have the potential to reduce both the severity and frequency of hot flashes.  She expressed her concern about being "very sensitive" to medications, so we will start conservatively with medical management of her vasomotor symptoms.  I have started her on Citalopram 1037m to take for the first 7 days, and then increase the dose to 216mday.  We discussed that there will be room to titrate up on the dose as needed to desired efficacy.  I also think that this SSRI will help with her current anxiety and mood swings, as well. Additionally, the choice of using Citalopram was deliberate because if she decides to resume anti-estrogen therapy, the Citalopram would be safe to use in combination  with Tamoxifen, as it is only a mild inhibitor of CYP2D6.   3.  Anxiety/Mood swings:  Ms. BuBaumbachs experiencing several signs of post-traumatic stress as a result of her cancer diagnosis and treatment.  This is not uncommon in cancer survivors, as they undergo a tremendous amount of physical and psychological stress in a short period of time from diagnosis to treatment.  Her mood swings can likely be attributed to her post-menopausal status s/p bilateral oopherectomy, as well as her current distress with anxious mood.  For the anxiety, I have prescribed Buspar 7.37m32mor her to take at night as needed to help with the anxiety.  Combination therapy with Buspar and an SSRI like Citalopram can  be effective in managing mood disorders. I encouraged her not to take the Buspar at the same time and her previously prescribed Ativan.  She expressed verbal understanding to this plan.    4.  Insomnia: This is multi-factorial, likely from anxiety and hot flashes that occur at night.  Once both are improved, I am hopeful that her sleep habits will improve.  The Buspar could potentially offer her sleep benefit as well.   5. Mild lymphedema: She does have some mild lymphedema noted in her right arm and hand on exam today.  She said that she does not want a referral to outpatient PT at this time because they have seen her before and it is difficult for her to get appointments there that accommodate her work/family schedule.  She reports having both a FlexiTouch lymph drainage pump, as well as a custom night garment.  She reports using the pump about 3x/week, and rarely uses the night garment because it is too hot/uncomfortable.  Despite her not wanting an additional referral to PT for lymphedema management, I will reach out to Leone Payor, PT, and have her touch base with the patient via phone to further evaluate potential needs/recommendations.  6. Bone health:  Given Ms. Hout's history of breast cancer and her previous  treatment regimen, she is at risk for bone demineralization.  She would likely need DEXA evaluation at the age of 48 or before, if clinically indicated.  In addition, she was encouraged to increase her consumption of foods rich in calcium, as well as increase her weight-bearing activities.  She was given education on specific activities to promote bone health.  6. Cancer screening:  Due to Ms. Jump's history and her age, she should receive screening for skin cancers, colon cancer, and gynecologic cancers.  The information and recommendations are listed on the patient's comprehensive care plan/treatment summary and were reviewed in detail with the patient.    7. Health maintenance and wellness promotion: Ms. Massimo was encouraged to consume 5-7 servings of fruits and vegetables per day. She was also encouraged to engage in moderate to vigorous exercise for 30 minutes per day most days of the week. She was instructed to limit her alcohol consumption and continue to abstain from tobacco use. Her vaccinations are currently up-to-date.     8. Support services/counseling: It is not uncommon for this period of the patient's cancer care trajectory to be one of many emotions and stressors.  Ms. Marcinek was encouraged to take advantage of our many support services programs, support groups, and/or counseling in coping with her new life as a cancer survivor after completing anti-cancer treatment.  She was offered support today through active listening and expressive supportive counseling.  She was given information regarding our available services and encouraged to contact me with any questions or for help enrolling in any of our support group/programs.    A total of 80 minutes of face-to-face time was spent with this patient with greater than 50% of that time in counseling and care-coordination.   I encouraged Ms. Karnik to ask questions and all questions were answered to her satisfaction.  She expressed  verbal understanding of the above plan of care and agrees with it.   She was encouraged to call me with any questions or concerns before her next appointment at the cancer center. Thank you for the opportunity to participate in this patient's care.     Mike Craze, NP [place] [phone]  Note: PRIMARY CARE PROVIDER Artelia Laroche, [place] [phone]

## 2014-10-22 ENCOUNTER — Telehealth: Payer: Self-pay | Admitting: Adult Health

## 2014-10-22 NOTE — Telephone Encounter (Signed)
I called Ms. Diniz to follow-up with her since our initial survivorship visit last week.  She told me that she has not started taking the Celexa that I prescribed for her at that visit because she is "struggling with what to do next" with regards to her adjuvant hormone treatment.   She expressed that she would much rather try a different anti-estrogen medication (AI) before she started the Celexa.  I explained to her that the majority of the symptoms she was experiencing were related to menopause (s/p chemotherapy and TAH/BSO) and the Celexa could offer some relief before trying different anti-estrogen therapy.  I educated her that while many people do tolerate the different aromatase inhibitors differently, they largely have a class effect with a similar side effect profile that largely makes many menopausal symptoms worse.  Her preference at this time remains that she would really like to try a new AI, like Femara, "just to see how my body will react to that new medication.  If I tolerate the Femara better than I did the Arimidex or the Tamoxifen, then I might not need the Celexa, but then at least I will know how a different AI will make me feel and if I need the Celexa in the future, then I will have it."    I offered support of her wishes and told her that I would speak with Dr. Jana Hakim so that he will be able to discuss this further with her at her next appointment with him on 11/04/13.    Ms. Mccalister stated that she appreciated my calling to check on her and I encouraged her to reach out to me with any needs or concerns she may have before her next visit here at the cancer center.     Mike Craze, NP [place] [phone]

## 2014-10-22 NOTE — Telephone Encounter (Signed)
Follow-up appt with Dr. Jana Hakim should be 11/05/14. Previous date entered was a typo.   Mike Craze, NP

## 2014-11-05 ENCOUNTER — Telehealth: Payer: Self-pay | Admitting: Oncology

## 2014-11-05 ENCOUNTER — Other Ambulatory Visit (HOSPITAL_BASED_OUTPATIENT_CLINIC_OR_DEPARTMENT_OTHER): Payer: Managed Care, Other (non HMO)

## 2014-11-05 ENCOUNTER — Encounter: Payer: Managed Care, Other (non HMO) | Admitting: Oncology

## 2014-11-05 VITALS — BP 141/84 | HR 80 | Temp 98.1°F | Resp 18 | Ht 68.0 in | Wt 184.1 lb

## 2014-11-05 DIAGNOSIS — C50419 Malignant neoplasm of upper-outer quadrant of unspecified female breast: Secondary | ICD-10-CM

## 2014-11-05 DIAGNOSIS — C50411 Malignant neoplasm of upper-outer quadrant of right female breast: Secondary | ICD-10-CM

## 2014-11-05 LAB — CBC WITH DIFFERENTIAL/PLATELET
BASO%: 0.3 % (ref 0.0–2.0)
Basophils Absolute: 0 10*3/uL (ref 0.0–0.1)
EOS%: 1.4 % (ref 0.0–7.0)
Eosinophils Absolute: 0.1 10*3/uL (ref 0.0–0.5)
HCT: 38.1 % (ref 34.8–46.6)
HGB: 12.9 g/dL (ref 11.6–15.9)
LYMPH%: 28.6 % (ref 14.0–49.7)
MCH: 31.8 pg (ref 25.1–34.0)
MCHC: 33.9 g/dL (ref 31.5–36.0)
MCV: 93.8 fL (ref 79.5–101.0)
MONO#: 0.4 10*3/uL (ref 0.1–0.9)
MONO%: 6.4 % (ref 0.0–14.0)
NEUT#: 4 10*3/uL (ref 1.5–6.5)
NEUT%: 63.3 % (ref 38.4–76.8)
PLATELETS: 230 10*3/uL (ref 145–400)
RBC: 4.06 10*6/uL (ref 3.70–5.45)
RDW: 12.9 % (ref 11.2–14.5)
WBC: 6.3 10*3/uL (ref 3.9–10.3)
lymph#: 1.8 10*3/uL (ref 0.9–3.3)

## 2014-11-05 LAB — COMPREHENSIVE METABOLIC PANEL (CC13)
ALT: 48 U/L (ref 0–55)
AST: 54 U/L — ABNORMAL HIGH (ref 5–34)
Albumin: 3.9 g/dL (ref 3.5–5.0)
Alkaline Phosphatase: 84 U/L (ref 40–150)
Anion Gap: 13 mEq/L — ABNORMAL HIGH (ref 3–11)
BUN: 11 mg/dL (ref 7.0–26.0)
CALCIUM: 9.3 mg/dL (ref 8.4–10.4)
CHLORIDE: 103 meq/L (ref 98–109)
CO2: 26 meq/L (ref 22–29)
CREATININE: 0.8 mg/dL (ref 0.6–1.1)
Glucose: 145 mg/dl — ABNORMAL HIGH (ref 70–140)
Potassium: 4.2 mEq/L (ref 3.5–5.1)
Sodium: 142 mEq/L (ref 136–145)
TOTAL PROTEIN: 7.4 g/dL (ref 6.4–8.3)
Total Bilirubin: 0.34 mg/dL (ref 0.20–1.20)

## 2014-11-05 MED ORDER — LETROZOLE 2.5 MG PO TABS: 2.5000 mg | ORAL_TABLET | Freq: Every day | ORAL | Status: DC

## 2014-11-05 NOTE — Progress Notes (Signed)
ID: Jody Dunn   DOB: [birth year]  MR#: [id number]  DXI#:[id number]  PCP: Artelia Laroche, CNM GYN: Brien Few, MD  SU: Coralie Keens, MD OTHER MD: Berton Mount, MD;   Stevphen Rochester, MD [facility]);  Stark Klein, MD  CHIEF COMPLAINT:  Hx of Right Breast Cancer CURRENT TREATMENT: Letrozole  BREAST CANCER HISTORY: From the original intake note:  Jody Dunn had a mammogram at [facility] in May of 2010 because of shooting pains in the right breast. This found no worrisome finding. In the spring of 2012, the patient at delivered her second child. She nursed but the child refused to take milk from the right breast. The patient tells me that she has been able to pump easily from the right breast, so production is not an issue.   In early June 2013, the patient felt a new lump in the right breast, and brought this to the attention of Artelia Laroche at [place]. The patient had repeat mammography at [facility] 02/07/2012. This found at the breast tissue to be heterogeneously dense, which is not a surprise given that the patient is lactating. There was diffuse skin thickening. Erythema is not described. The nipple was retracted. There was no definitive peau d'orange appearance. Pleomorphic calcifications in the lateral right breast extended approximately 11 cm, without definite mass. On ultrasound there was a vague irregular hypoechoic mass measuring approximately 15 mm in the right breast, with a second ill-defined hypoechoic lobulated mass measuring approximately 17 mm.   Both these masses were biopsied 02/09/2012, and the final pathology ([id number]) showed both IIb invasive ductal carcinoma, with a prognostic panel (from the mass at 11:00) as follows: estrogen receptor positive at 90%, progesterone receptor positive at 90%, Mib-1 of 30%, and HER-2 amplification with a ratio of 4.29 by CISH. MRI of the breast was performed 02/15/2012 and showed the mass in the 10:00 position to measure 3.1 cm, the one at the  11:00 position to measure 3.1 cm. There was diffuse skin thickening in the right breast and 2 enhancing abnormal-appearing lower right axillary lymph nodes measuring 2.0 and 1.8 cm respectively. There was no evidence for internal mammary adenopathy, left axillary adenopathy, or significant findings in the left breast.  Subsequent treatment is as detailed below.  INTERVAL HISTORY: Jody Dunn returns for follow up of her right breast cancer. She went off tamoxifen just before Christmas 2015. Within a month she was feeling a lot better mentally. This is the second time she goes off tamoxifen (the other time was when she had her reconstruction) and both times she felt emotionally much better. She feels tamoxifen directly affects her mood. She met with our survivorship extender and Celexa was suggested but Jody Dunn really does not want to add another medication. She does once define an antiestrogen that we'll work for her. She is here to discuss those options.  REVIEW OF SYSTEMS: Of course she is having multiple menopausal symptoms including insomnia, mood swings, hot flashes, and vaginal dryness per she is not having any arthralgias or myalgias however. A detailed review of systems today was otherwise noncontributory  PAST MEDICAL HISTORY: Past Medical History  Diagnosis Date  . Iron (Fe) deficiency anemia   . Asthma     exercise induced  . Back pain   . Migraines   . Lactose intolerance   . Hyperlipidemia   . Allergy   . Anxiety 09/18/2013  . Breast cancer   . Cancer     PAST SURGICAL HISTORY: Past Surgical History  Procedure  Laterality Date  . Appendectomy    . Shoulder surgery    . Knee surgery      X2  . Portacath placement  02/21/2012    Procedure: INSERTION PORT-A-CATH;  Surgeon: Stark Klein, MD;  Location: [facility];  Service: General;  Laterality: N/A;  . Skin biopsy  02/21/2012    Procedure: BIOPSY SKIN;  Surgeon: Stark Klein, MD;  Location: [facility];  Service: General;  Laterality: Right;   . Breast biopsy  02/09/2012  . Mastectomy modified radical  06/28/2012    Procedure: MASTECTOMY MODIFIED RADICAL;  Surgeon: Stark Klein, MD;  Location: [facility];  Service: General;  Laterality: Right;  Right Modified Radical Mastectomy and Left Simple Mastectomy  . Simple mastectomy with axillary sentinel node biopsy  06/28/2012    Procedure: SIMPLE MASTECTOMY;  Surgeon: Stark Klein, MD;  Location: [facility];  Service: General;  Laterality: Left;  . Robotic assisted total hysterectomy with bilateral salpingo oopherectomy N/A 11/13/2012    Procedure: ROBOTIC ASSISTED TOTAL HYSTERECTOMY WITH BILATERAL SALPINGO OOPHORECTOMY;  Surgeon: Lovenia Kim, MD;  Location: [facility];  Service: Gynecology;  Laterality: N/A;  . Abdominal hysterectomy      FAMILY HISTORY Family History  Problem Relation Age of Onset  . Cancer Paternal Aunt 34    Breast cancer (half sibling, related through father)  . Spina bifida Paternal Uncle 0    died around 21-84 months of age  . Cancer Maternal Grandmother 68    colon cancer  . Dementia Maternal Grandfather   . Cancer Paternal Grandmother     breast cancer between 73-42  . Cancer Paternal Grandfather     brain cancer  . Cancer Other     Paternal grandmother's siblings had breast cancer and pancreatic cancer  . Cancer Other 32    maternal great grandmother with breast cancer   the patient's parents are alive, in their early 67s. The patient is an only child. There is a significant family history of cancer including breast, brain, and colon, detailed in our genetic counselors report. BRCA and P. 53 testing is negative  GYNECOLOGIC HISTORY:  (Reviewed 02/06/2014) Menarche age 39; she is GX P2, first live birth age 27.  She had not had periods for a long time since she had been breast-feeding, but resumed menstruating July 2013.  She stopped having menstrual cycles during neoadjuvant chemotherapy. She is status post hysterectomy and bilateral salpingo-oophorectomy in  March 2014.  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 Her husband Elbin Percell Miller "Ed" Quentin Angst. works as a Educational psychologist for [employer]	[place]. Their children are Dyann Kief (2008) and [name] (2012).   ADVANCED DIRECTIVES: not in place  HEALTH MAINTENANCE:  (Reviewed 02/06/2014)  History  Substance Use Topics  . Smoking status: Never Smoker   . Smokeless tobacco: Never Used  . Alcohol Use: 2.5 - 3.5 oz/week    5-7 drink(s) per week     Comment: OCCASIONAL     Colonoscopy: never  PAP: 2012/ s/p hysterectomy in March 2014  Bone density: never  Lipid panel:  June 2014, elevated    Allergies  Allergen Reactions  . Zofran [Ondansetron Hcl] Nausea And Vomiting, Other (See Comments) and Rash    Migraine headaches Migraine headaches and vomiting  . Oxycodone Itching and Rash  . Adhesive [Tape] Hives  . Antiseptic Products, Misc.     duraprep-rash  . Clindamycin Hives  . Clindamycin/Lincomycin Hives  . Hydrocodone-Acetaminophen Hives  .  Duraprep [employer], Misc.] Hives and Rash  . Penicillins Hives, Nausea And Vomiting and Rash    vomiting    Current Outpatient Prescriptions  Medication Sig Dispense Refill  . busPIRone (BUSPAR) 7.5 MG tablet Take 1 tablet (7.5 mg total) by mouth at bedtime as needed (For anxiety). 90 tablet 0  . citalopram (CELEXA) 20 MG tablet Take 1 tablet (20 mg total) by mouth daily. Take 0.5 tab (41m) for first 7 days. Then increase dose to 1 tab (284m daily. 90 tablet 0  . gabapentin (NEURONTIN) 300 MG capsule Take 1 capsule (300 mg total) by mouth at bedtime. 90 capsule 2  . lactose hydrous POWD Take by mouth.    . Marland KitchenORazepam (ATIVAN) 0.5 MG tablet Take 0.5-1 tablets (0.25-0.5 mg total) by mouth 2 (two) times daily as needed for anxiety or sleep. 30 tablet 0  . naproxen sodium (ANAPROX) 220 MG tablet Take 220 mg by mouth daily as needed.     . Probiotic Product (PROBIOTIC & ACIDOPHILUS EX  ST) CAPS Take 1 capsule by mouth daily.     . tamoxifen (NOLVADEX) 20 MG tablet Take 1 tablet (20 mg total) by mouth daily. 90 tablet 3   No current facility-administered medications for this visit.    Objective: Young white woman in no acute distress  Filed Vitals:   11/05/14 1136  BP: 141/84  Pulse: 80  Temp: 98.1 F (36.7 C)  Resp: 18  Body mass index is 28 kg/(m^2).  ECOG:  0 Filed Weights   11/05/14 1136  Weight: 184 lb 1.6 oz (83.507 kg)   Sclerae unicteric, pupils equal and reactive Oropharynx clear and moist-- no thrush or other lesions No cervical or supraclavicular adenopathy Lungs no rales or rhonchi Heart regular rate and rhythm Abd soft, nontender, positive bowel sounds MSK no focal spinal tenderness, no upper extremity lymphedema Neuro: nonfocal, well oriented, appropriate affect Breasts: status post bilateral mastectomies with bilateral TRAM reconstruction. No evidence of local recurrence. Both axillae are benign.    LAB RESULTS: Lab Results  Component Value Date   WBC 6.3 11/05/2014   NEUTROABS 4.0 11/05/2014   HGB 12.9 11/05/2014   HCT 38.1 11/05/2014   MCV 93.8 11/05/2014   PLT 230 11/05/2014       Chemistry      Component Value Date/Time   NA 142 11/05/2014 1123   NA 136 05/09/2014 1346   K 4.2 11/05/2014 1123   K 4.0 05/09/2014 1346   CL 102 05/09/2014 1346   CL 105 01/25/2013 0910   CO2 26 11/05/2014 1123   CO2 24 05/09/2014 1346   BUN 11.0 11/05/2014 1123   BUN 14 05/09/2014 1346   CREATININE 0.8 11/05/2014 1123   CREATININE 0.89 05/09/2014 1346      Component Value Date/Time   CALCIUM 9.3 11/05/2014 1123   CALCIUM 9.0 05/09/2014 1346   ALKPHOS 84 11/05/2014 1123   ALKPHOS 65 05/09/2014 1346   AST 54* 11/05/2014 1123   AST 30 05/09/2014 1346   ALT 48 11/05/2014 1123   ALT 26 05/09/2014 1346   BILITOT 0.34 11/05/2014 1123   BILITOT 0.3 05/09/2014 1346       STUDIES: CLINICAL DATA: Right rib pain, right breast cancer,  history of rib fracture  EXAM: CHEST 2 VIEW  COMPARISON: CT scan 01/30/2014  FINDINGS: Cardiomediastinal silhouette is stable. No acute infiltrate or pleural effusion. Again noted postsurgical changes right breast and right axilla. No acute infiltrate or pulmonary edema. No destructive bony  lesions are noted.  IMPRESSION: No active cardiopulmonary disease.   Electronically Signed  By: Lahoma Crocker M.D.  On: 07/01/2014 13:41   ASSESSMENT: 39 y.o. BRCA-negative [place] woman   (1)  status post right breast biopsy 02/09/2012 for multifocal pT4 cN1, stage IIIB invasive ductal carcinoma. Prognostic profile from one of the 2 masses shows it to be triple positive with an MIB-1 of 30%.   (2)  s/p 4 dose dense cycles of doxorubicin/ cyclophosphamide, followed by 4 cycles of paclitaxel, dose-dense,completed 06/07/2012, given with trastuzumab   (3) trastuzumab given every 3 weeks through early June 2014, discontinued due to a drop in the ejection fraction which has recovered. Final echo 03/12/2013.  (4) s/p Right modified radical mastectomy (with prophylactic left simple mastectomy) 06/28/2012, showing a residual 2.3 cm invasive ductal carcinoma, grade 3, with 0 of 14 lymph nodes involved.  (5)  Received radiation in [place] under the care of Dr. Berton Mount, beginning January 2014 and completed in late February 2014  (6)  Started tamoxifen March 2014, discontinued in November 2014 due to side effects   (a) started on anastrozole in December 2014 discontinued in January 2015 due to side effects  (b) restarted tamoxifen in February 2015, discontinued December 2015 due to side effects  (c) started letrozole March 2016  (7)  status post total hysterectomy with bilateral salpingo-oophorectomy under the care of Dr. Ronita Hipps, 11/13/2012.  (8)  Status post bilateral breast reconstruction under the care of Dr. Isa Rankin at [place]:  Brett has had a rough time with  menopause. This is very common in women her age, of course, and it is very difficult to separate menopausal symptoms from symptoms of anti-estrogens or made worse by anti-estrogens.  She actually has a good grasp of the use of placebo controlled randomized double blinded studies to sort out what is and is not a side effect. She is aware that in those studies tamoxifen has not been shown to affect depression. In her case however going off tamoxifen both times significantly improved her mood.  One solution, which was discussed by our survivorship extender would be to take Celexa [place] Cerise did not like the way Effexor made her feel). Gidget however does not want to do that. What she would like to do is try other anti-estrogens and see if we can head and one that really does agree with her.  Accordingly we are starting letrozole. She has been off tamoxifen now for 3 months so that is mostly out of her system. We discussed the possible side effects, toxicities and complications of letrozole and I gave her that information in writing. I put in the prescription for her today.  Chennel is aware of the fact that taking anti-estrogens for at least 5 years is very important. The consequences can be very negative. She is very motivated. She just would like to try other pills before adding Celexa eventually we may end up having to do that and she understands that  She will see me again in 3 months. She knows to call for any problems that may develop before that.  Chauncey Cruel, MD    11/05/2014   This encounter was created in error - please disregard.

## 2014-11-05 NOTE — Telephone Encounter (Signed)
appts made and avs printed for pt,  Pt did req that we move her appt to july due to ins coverage change and to be after her beach trip.  Pt declined to take avs as she placed appt in phone    Jody Dunn

## 2014-12-10 NOTE — Consult Note (Signed)
Reason for Visit: This 39 year old Female patient presents to the clinic for initial evaluation of  Breast cancer .   Referred by Dr. Shelda Pal [facility] health medical oncology.  Diagnosis:   Chief Complaint/Diagnosis   39 year old female with initial stage IIIB (T4, N1, M0) based on inflammatory carcinoma. After neoadjuvant chemotherapy and mastectomy down staged to yp T2, N0, M0 disease. Patient status post neoadjuvant chemotherapy plus bilateral mastectomy.   Pathology Report Pathology report reviewed    Imaging Report MRI scans and mammograms requested for my review    Referral Report Clinical notes reviewed    Planned Treatment Regimen Adjuvant radiation therapy to right chest wall and peripheral lymphatics    HPI   patient is a 39 year old female who was breast feeding back in June of 2013 and interestingly enough noted that her child did not breast-feed in the right side. She discovered a palpable mass. Mammogram demonstrated diffuse skin thickening as well as pleomorphic calcifications within the lateral aspect of the right breast. On ultrasound 2 masses were identified one in the 11:00 position and one at 10:00 position. There was also a lymph node showing mild thickening of the cortex seen in the axilla. Needle biopsies both were positive for invasive ductal carcinoma. There was dermal lymphatic invasion noted. Tumor was ER/PR positive and overexpressed HER-2/neu. MRI confirmed 2 masses in the breast both measuring at least 3 cm. Also to adjacent lower axillary nodes were felt to be pathologic. Patient was seen by medical oncology and underwent neoadjuvant chemotherapy consisting of 4 cycles of Cytoxan Adriamycin followed by dose dense Taxol for 12 cycles along with Herceptin. She had good treatment response by repeat MRI scan and then underwent bilateral mastectomy. She did have Fransisco Beau one testing which was negative. At the time of mastectomy there was residual 2.3 cm grade 3 invasive  ductal carcinoma present in the right breast. Angiolymphatic invasion was present. A total of 14 lymph nodes were dissected 10 show posttreatment effect all negative for metastatic disease. Margins were clear. There was extensive high-grade ductal carcinoma in situ also present. Tumor was high grade. Patient is also been assessed by plastic surgery and is scheduled to have bilateral reconstruction about 6 months after completion of radiation. She seen today for consultation and is doing well. She has also been seen in [facility] for radiation oncology opinion and they have recommended adjuvant radiation therapy to right chest wall and peripheral lymphatics. Her surgery was 2 weeks prior she is healing well and is seen today and is without complaint.she has no lymphedema at this time.  Past Hx:    Breast Cancer:    Reconstruction Left Shoulder 1997:    Arthroscopy Left Knee 2005 and 1995:    Bilateral Mastectomy June 28, 2012:    Portacath 2013:   Past, Family and Social History:   Past Medical History positive    Respiratory asthma    Gastrointestinal GERD; Lactose intolerance    Neurological/Psychiatric migraine    Past Surgical History appendectomy; Left shoulder reconstruction, Port-A-Cath placed in 2013, bilateral mastectomy, arthroscopic left knee surgery x2    Family History positive    Family History Comments Paternal at with breast cancer, paternal grandmother with breast cancer, maternal grandfather with colon cancer    Social History noncontributory    Additional Past Medical and Surgical History Seen by herself today   Allergies:   Penicillin: Rash  Dura Prep: Rash  Zofran ODT: Headaches  Home Meds:  Home Medications: Medication Instructions  Status  Probiotic for Lactose Intolerance 1 cap(s) orally once a day Active   Review of Systems:   General negative    Performance Status (ECOG) 0    Skin see HPI    Breast see HPI    Ophthalmologic negative     ENMT negative    Respiratory and Thorax negative    Cardiovascular negative    Gastrointestinal negative    Genitourinary negative    Musculoskeletal negative    Neurological negative    Psychiatric negative    Hematology/Lymphatics negative    Endocrine negative    Allergic/Immunologic negative    Review of Systems   according to nurses notePatient denies any weight loss, fatigue, weakness, fever, chills or night sweats. Patient denies any loss of vision, blurred vision. Patient denies any ringing  of the ears or hearing loss. No irregular heartbeat. Patient denies heart murmur or history of fainting. Patient denies any chest pain or pain radiating to her upper extremities. Patient denies any shortness of breath, difficulty breathing at night, cough or hemoptysis. Patient denies any swelling in the lower legs. Patient denies any nausea vomiting, vomiting of blood, or coffee ground material in the vomitus. Patient denies any stomach pain. Patient states has had normal bowel movements no significant constipation or diarrhea. Patient denies any dysuria, hematuria or significant nocturia. Patient denies any problems walking, swelling in the joints or loss of balance. Patient denies any skin changes, loss of hair or loss of weight. Patient denies any excessive worrying or anxiety or significant depression. Patient denies any problems with insomnia. Patient denies excessive thirst, polyuria, polydipsia. Patient denies any swollen glands, patient denies easy bruising or easy bleeding. Patient denies any recent infections, allergies or URI. Patient "s visual fields have not changed significantly in recent time.  Nursing Notes:  Nursing Vital Signs and Chemo Nursing Nursing Notes: *CC Vital Signs Flowsheet:   20-Nov-13 10:07   Temp Temperature 97.4   Pulse Pulse 80   Respirations Respirations 20   SBP SBP 129   DBP DBP 90   Pain Scale (0-10)  0   Current Weight (kg) (kg) 78.3   Height  (cm) centimeters 173.5   BSA (m2) 1.9   Physical Exam:  General/Skin/HEENT:   General normal    Skin normal    Eyes normal    ENMT normal    Head and Neck normal    Additional PE Well-developed well-nourished female in NAD. She is status post bilateral mastectomies. Incisions are healing well the drains are still present. No dominant mass or nodularity the chest wall is noted bilaterally. No axillary or supraclavicular adenopathy is noted. Lungs are clear to A&P cardiac examination shows regular rate and rhythm. Abdomen is benign with no organomegaly or masses noted. No evidence of lymphedema in either upper extremity is identified.   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  Plan:   at this time I concur with the recommendation to have right chest wall and peripheral lymphatic radiation. Would treat her right chest wall and peripheral lymphatics to 5000 cGy. Based on her negative lymph node  sampling at the time of surgery did not feel we need to include the internal mammary nodes in her treatment field. At this young ages result would also cut down on her cardiac radiation. I would also boost or scar another 1400 cGy electron beam. Risks and benefits of treatment including skin reaction, fatigue, alteration of blood counts, inclusion of some superficial lung in her treatment fields, and long-term potential for lymphedema of the right upper extremity was explained in detail. I've encouraged her to start an exercise program using her right upper extremity to  try and reduce the chances of lymphedema. I will like to wait another 2 weeks after the Thanksgiving holiday to proceed with treatment planning to allow further healing of her scars. Patient has consented to her treatment plan.  I would like to take this opportunity to thank you for allowing me to continue to participate in this patient's care.  CC Referral:   cc: Dr. Shelda Pal [facility] health medical oncology ,Dr Mamie Laurel   [facility]   Electronic Signatures: Armstead Peaks (MD)  (Signed [phone] 12:40)  Authored: HPI, Diagnosis, Past Hx, PFSH, Allergies, Home Meds, ROS, Nursing Notes, Physical Exam, Encounter Assessment and Plan, CC Referring Physician   Last Updated: 20-Nov-13 12:40 by Armstead Peaks (MD)

## 2014-12-17 ENCOUNTER — Encounter: Payer: Self-pay | Admitting: Adult Health

## 2014-12-17 NOTE — Progress Notes (Signed)
A birthday card was mailed to the patient today on behalf of the Survivorship Program at [facility].   Gretchen Dawson, NP Survivorship Program [facility] [phone]  

## 2015-03-05 ENCOUNTER — Telehealth: Payer: Self-pay | Admitting: *Deleted

## 2015-03-05 ENCOUNTER — Other Ambulatory Visit: Payer: Self-pay | Admitting: *Deleted

## 2015-03-05 ENCOUNTER — Encounter: Payer: Self-pay | Admitting: Oncology

## 2015-03-05 DIAGNOSIS — C50411 Malignant neoplasm of upper-outer quadrant of right female breast: Secondary | ICD-10-CM

## 2015-03-05 NOTE — Telephone Encounter (Signed)
Per Dr. Jana Hakim, this RN ordered a chest xray with right rib films to be done at [facility] Radiology department. Patient verbalized understanding.

## 2015-03-05 NOTE — Telephone Encounter (Signed)
Patient called stating that last year she broke her ribs and recently her right side underneath her ribs are painful/tender going to her sternum. Patient would like a chest x-ray. Patient has an office visit with MD Magrinat on 03/12/15 and would like the exam before that appointment. Message sent to MD Magrinat/RN Erline Levine.

## 2015-03-12 ENCOUNTER — Ambulatory Visit (HOSPITAL_COMMUNITY)
Admission: RE | Admit: 2015-03-12 | Discharge: 2015-03-12 | Disposition: A | Discharge status: Home / Self Care | Payer: BLUE CROSS/BLUE SHIELD | Source: Ambulatory Visit | Attending: Oncology | Admitting: Oncology

## 2015-03-12 ENCOUNTER — Telehealth: Payer: Self-pay | Admitting: Oncology

## 2015-03-12 ENCOUNTER — Ambulatory Visit (HOSPITAL_BASED_OUTPATIENT_CLINIC_OR_DEPARTMENT_OTHER): Payer: BLUE CROSS/BLUE SHIELD | Admitting: Oncology

## 2015-03-12 VITALS — BP 136/90 | HR 100 | Temp 98.5°F | Resp 18 | Ht 68.0 in | Wt 191.4 lb

## 2015-03-12 DIAGNOSIS — R0781 Pleurodynia: Secondary | ICD-10-CM | POA: Diagnosis present

## 2015-03-12 DIAGNOSIS — C50411 Malignant neoplasm of upper-outer quadrant of right female breast: Secondary | ICD-10-CM

## 2015-03-12 DIAGNOSIS — Z170 Estrogen receptor positive status [ER+]: Secondary | ICD-10-CM

## 2015-03-12 MED ORDER — KETOCONAZOLE 2 % EX CREA: 1.0000 "application " | TOPICAL_CREAM | Freq: Every day | CUTANEOUS | Status: DC

## 2015-03-12 MED ORDER — VENLAFAXINE HCL ER 37.5 MG PO CP24: 37.5000 mg | ORAL_CAPSULE | Freq: Every day | ORAL | Status: DC

## 2015-03-12 MED ORDER — FLUOCINONIDE-E 0.05 % EX CREA: 1.0000 "application " | TOPICAL_CREAM | Freq: Two times a day (BID) | CUTANEOUS | Status: DC

## 2015-03-12 NOTE — Telephone Encounter (Signed)
Pt confirmed appointment for December. Patient did not want avs

## 2015-03-12 NOTE — Progress Notes (Signed)
ID: Jody Dunn   DOB: April 03, 1976  MR#: [id number]  CLE#:[id number]  PCP: Jody Dunn, CNM GYN: Jody Few, MD  SU: Jody Keens, MD OTHER MD: Jody Mount, MD;   Jody Rochester, MD [facility]);  Jody Klein, MD  CHIEF COMPLAINT:  Hx of Right Breast Cancer CURRENT TREATMENT: Venlafaxine, to start tamoxifen in September  BREAST CANCER HISTORY: From the original intake note:  Jody Dunn had a mammogram at [facility] in May of 2010 because of shooting pains in the right breast. This found no worrisome finding. In the spring of 2012, the patient at delivered her second child. She nursed but the child refused to take milk from the right breast. The patient tells me that she has been able to pump easily from the right breast, so production is not an issue.   In early June 2013, the patient felt a new lump in the right breast, and brought this to the attention of Jody Dunn at [place]. The patient had repeat mammography at [facility] 02/07/2012. This found at the breast tissue to be heterogeneously dense, which is not a surprise given that the patient is lactating. There was diffuse skin thickening. Erythema is not described. The nipple was retracted. There was no definitive peau d'orange appearance. Pleomorphic calcifications in the lateral right breast extended approximately 11 cm, without definite mass. On ultrasound there was a vague irregular hypoechoic mass measuring approximately 15 mm in the right breast, with a second ill-defined hypoechoic lobulated mass measuring approximately 17 mm.   Both these masses were biopsied 02/09/2012, and the final pathology ([id number]) showed both IIb invasive ductal carcinoma, with a prognostic panel (from the mass at 11:00) as follows: estrogen receptor positive at 90%, progesterone receptor positive at 90%, Mib-1 of 30%, and HER-2 amplification with a ratio of 4.29 by CISH. MRI of the breast was performed 02/15/2012 and showed the mass in the 10:00 position  to measure 3.1 cm, the one at the 11:00 position to measure 3.1 cm. There was diffuse skin thickening in the right breast and 2 enhancing abnormal-appearing lower right axillary lymph nodes measuring 2.0 and 1.8 cm respectively. There was no evidence for internal mammary adenopathy, left axillary adenopathy, or significant findings in the left breast.  Subsequent treatment is as detailed below.  INTERVAL HISTORY: Jody Dunn returns for follow up of her right breast cancer. After being on letrozole for some weeks she had significant pain all over per she was unable to walk upstairs except one step at a time. She felt very anxious. She has significant hot flashes. She was not able to tolerate this. About 3 weeks ago she stopped the medication and those symptoms all became better, although they have not entirely cleared yet.  REVIEW OF SYSTEMS: She just returned from a family beach trip. She has been having increasing pain in the right rib cage area. For that reason we obtained a chest x-ray today, but that was unrevealing. She is concerned that she is gaining weight. She doesn't sleep very well she has night sweats and hot flashes. She has some constipation issues. She has a rash under the right breast. She feels anxious and depressed. A detailed review of systems today was otherwise stable  PAST MEDICAL HISTORY: Past Medical History  Diagnosis Date  . Iron (Fe) deficiency anemia   . Asthma     exercise induced  . Back pain   . Migraines   . Lactose intolerance   . Hyperlipidemia   . Allergy   .  Anxiety 09/18/2013  . Breast cancer   . Cancer     PAST SURGICAL HISTORY: Past Surgical History  Procedure Laterality Date  . Appendectomy    . Shoulder surgery    . Knee surgery      X2  . Portacath placement  02/21/2012    Procedure: INSERTION PORT-A-CATH;  Surgeon: Jody Byerly, MD;  Location: [facility];  Service: General;  Laterality: N/A;  . Skin biopsy  02/21/2012    Procedure: BIOPSY SKIN;   Surgeon: Jody Byerly, MD;  Location: [facility];  Service: General;  Laterality: Right;  . Breast biopsy  02/09/2012  . Mastectomy modified radical  06/28/2012    Procedure: MASTECTOMY MODIFIED RADICAL;  Surgeon: Jody Byerly, MD;  Location: [facility];  Service: General;  Laterality: Right;  Right Modified Radical Mastectomy and Left Simple Mastectomy  . Simple mastectomy with axillary sentinel node biopsy  06/28/2012    Procedure: SIMPLE MASTECTOMY;  Surgeon: Jody Byerly, MD;  Location: [facility];  Service: General;  Laterality: Left;  . Robotic assisted total hysterectomy with bilateral salpingo oopherectomy N/A 11/13/2012    Procedure: ROBOTIC ASSISTED TOTAL HYSTERECTOMY WITH BILATERAL SALPINGO OOPHORECTOMY;  Surgeon: Jody J Taavon, MD;  Location: [facility];  Service: Gynecology;  Laterality: N/A;  . Abdominal hysterectomy      FAMILY HISTORY Family History  Problem Relation Age of Onset  . Cancer Paternal Aunt 45    Breast cancer (half sibling, related through father)  . Spina bifida Paternal Uncle 0    died around 7-8 months of age  . Cancer Maternal Grandmother 64    colon cancer  . Dementia Maternal Grandfather   . Cancer Paternal Grandmother     breast cancer between 38-42  . Cancer Paternal Grandfather     brain cancer  . Cancer Other     Paternal grandmother's siblings had breast cancer and pancreatic cancer  . Cancer Other 50    maternal great grandmother with breast cancer   the patient's parents are alive, in their early 60s. The patient is an only child. There is a significant family history of cancer including breast, brain, and colon, detailed in our genetic counselors report. BRCA and P. 53 testing is negative  GYNECOLOGIC HISTORY:  (Reviewed 02/06/2014) Menarche age 16; she is GX P2, first live birth age 30.  She had not had periods for a long time since she had been breast-feeding, but resumed menstruating July 2013.  She stopped having menstrual cycles during neoadjuvant  chemotherapy. She is status post hysterectomy and bilateral salpingo-oophorectomy in March 2014.  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 Her husband Elbin Edward "Ed" Yang Jr. works as a production manager for [employer]. Their children are Abram (2008) and [name] (2012).   ADVANCED DIRECTIVES: not in place  HEALTH MAINTENANCE:  (Reviewed 02/06/2014)  History  Substance Use Topics  . Smoking status: Never Smoker   . Smokeless tobacco: Never Used  . Alcohol Use: 2.5 - 3.5 oz/week    5-7 drink(s) per week     Comment: OCCASIONAL     Colonoscopy: never  PAP: 2012/ s/p hysterectomy in March 2014  Bone density: never  Lipid panel:  June 2014, elevated    Allergies  Allergen Reactions  . Zofran [Ondansetron Hcl] Nausea And Vomiting, Other (See Comments) and Rash    Migraine headaches Migraine headaches and vomiting  . Oxycodone Itching and Rash  . Adhesive [Tape] Hives  .   Antiseptic Products, Misc.     duraprep-rash  . Clindamycin Hives  . Clindamycin/Lincomycin Hives  . Hydrocodone-Acetaminophen Hives  . Duraprep [Antiseptic Products, Misc.] Hives and Rash  . Penicillins Hives, Nausea And Vomiting and Rash    vomiting    Current Outpatient Prescriptions  Medication Sig Dispense Refill  . busPIRone (BUSPAR) 7.5 MG tablet Take 1 tablet (7.5 mg total) by mouth at bedtime as needed (For anxiety). 90 tablet 0  . gabapentin (NEURONTIN) 300 MG capsule Take 1 capsule (300 mg total) by mouth at bedtime. 90 capsule 2  . lactose hydrous POWD Take by mouth.    . letrozole (FEMARA) 2.5 MG tablet Take 1 tablet (2.5 mg total) by mouth daily. 90 tablet 4  . LORazepam (ATIVAN) 0.5 MG tablet Take 0.5-1 tablets (0.25-0.5 mg total) by mouth 2 (two) times daily as needed for anxiety or sleep. 30 tablet 0  . naproxen sodium (ANAPROX) 220 MG tablet Take 220 mg by mouth daily as needed.     . Probiotic Product (PROBIOTIC &  ACIDOPHILUS EX ST) CAPS Take 1 capsule by mouth daily.      No current facility-administered medications for this visit.    Objective: Young white woman who appears stated age  Filed Vitals:   03/12/15 1606  BP: 136/90  Pulse: 100  Temp: 98.5 F (36.9 C)  Resp: 18  Body mass index is 29.11 kg/(m^2).  ECOG:  1 Filed Weights   03/12/15 1606  Weight: 191 lb 6.4 oz (86.818 kg)   Sclerae unicteric, EOMs intact Oropharynx clear, dentition in good repair No cervical or supraclavicular adenopathy Lungs no rales or rhonchi Heart regular rate and rhythm Abd soft, nontender, positive bowel sounds MSK no focal spinal tenderness, grade 1 right upper extremity lymphedema Neuro: nonfocal, well oriented, appropriate affect Breasts: Status post bilateral mastectomies with bilateral reconstruction. There is no evidence of local recurrence. Both axillae are benign Skin: There is a mild candidal rash in the inferior mammary fold on the right .    LAB RESULTS: Lab Results  Component Value Date   WBC 6.3 11/05/2014   NEUTROABS 4.0 11/05/2014   HGB 12.9 11/05/2014   HCT 38.1 11/05/2014   MCV 93.8 11/05/2014   PLT 230 11/05/2014       Chemistry      Component Value Date/Time   NA 142 11/05/2014 1123   NA 136 05/09/2014 1346   K 4.2 11/05/2014 1123   K 4.0 05/09/2014 1346   CL 102 05/09/2014 1346   CL 105 01/25/2013 0910   CO2 26 11/05/2014 1123   CO2 24 05/09/2014 1346   BUN 11.0 11/05/2014 1123   BUN 14 05/09/2014 1346   CREATININE 0.8 11/05/2014 1123   CREATININE 0.89 05/09/2014 1346      Component Value Date/Time   CALCIUM 9.3 11/05/2014 1123   CALCIUM 9.0 05/09/2014 1346   ALKPHOS 84 11/05/2014 1123   ALKPHOS 65 05/09/2014 1346   AST 54* 11/05/2014 1123   AST 30 05/09/2014 1346   ALT 48 11/05/2014 1123   ALT 26 05/09/2014 1346   BILITOT 0.34 11/05/2014 1123   BILITOT 0.3 05/09/2014 1346       STUDIES: Dg Chest 2 View  03/12/2015   CLINICAL DATA:  39-year-old  female with breast cancer and right anterior rib pain for 1 week. Initial encounter.  EXAM: CHEST  2 VIEW  COMPARISON:  07/01/2014 and earlier.  FINDINGS: Postoperative changes to the right axilla and bilateral chest wall. Stable   lung volumes. Normal cardiac size and mediastinal contours. No pneumothorax, pulmonary edema, pleural effusion or acute pulmonary opacity. Visualized tracheal air column is within normal limits.  Radiographically stable visualized osseous structures.  IMPRESSION: Stable radiographic appearance of the chest.  Restaging chest CT would be more sensitive for detection of bone metastasis, and could be compared to the study on 01/30/2014.   Electronically Signed   By: H  Hall M.D.   On: 03/12/2015 15:33     ASSESSMENT: 39 y.o. BRCA-negative [place] woman   (1)  status post right breast biopsy 02/09/2012 for multifocal pT4 cN1, stage IIIB invasive ductal carcinoma. Prognostic profile from one of the 2 masses shows it to be triple positive with an MIB-1 of 30%.   (2)  s/p 4 dose dense cycles of doxorubicin/ cyclophosphamide, followed by 4 cycles of paclitaxel, dose-dense,completed 06/07/2012, given with trastuzumab   (3) trastuzumab given every 3 weeks through early June 2014, discontinued due to a drop in the ejection fraction which has recovered. Final echo 03/12/2013.  (4) s/p Right modified radical mastectomy (with prophylactic left simple mastectomy) 06/28/2012, showing a residual 2.3 cm invasive ductal carcinoma, grade 3, with 0 of 14 lymph nodes involved.  (a) Status post bilateral breast reconstruction under the care of Dr. Roughton at [facility]   (5)  Received radiation in [place] under the care of Dr. Glenn Crystal, beginning January 2014 and completed in late February 2014  (6)  Started tamoxifen March 2014, discontinued in November 2014 due to side effects   (a) started on anastrozole in December 2014 discontinued in January 2015 due to side effects  (b) restarted  tamoxifen in February 2015, discontinued December 2015 due to side effects  (c) started letrozole March 2016, discontinued June 2016 due to side effects  (7)  status post total hysterectomy with bilateral salpingo-oophorectomy 11/13/2012.  (8) started venlafaxine, low-dose, in preparation for retrying tamoxifen later this year   PLAN:  Hildred has given the letrozole a good trial. Just like with anastrozole, she could not tolerate the side effects. At this point she tells me she is willing to go on tamoxifen again and to give venlafaxine a try.  I think that is very reasonable. We're going to do is start venlafaxine at 37.5 mg. She will call us in a couple of weeks to let us know she is doing with that. If she is not having side effects from that we will up at 275 mg and after 2 or 3 weeks we will consider going 250 mg.  Once she is on a stable and effective dose of venlafaxine she will start tamoxifen. I suspect this will be sometime in February. Accordingly I am going to see her again in December.  If she cannot tolerate tamoxifen we can consider Forrest on or exemestane.  I also wrote her for Nizoral cream for the yeast rash that she has under her right breast. Finally I am concerned that she is having more bony pain. This did not show up on the chest x-ray. I'm going to get her a bone scan and chest CT scan just to make sure that we are not having stage IV disease as the cause of her pain at this point.  Jayani has a good understanding of this plan. She agrees with it. She will call as stated above and also for any other problems that may develop before her next visit here. MAGRINAT,GUSTAV C, MD    03/12/2015   

## 2015-03-17 ENCOUNTER — Ambulatory Visit (HOSPITAL_COMMUNITY)
Admission: RE | Admit: 2015-03-17 | Discharge: 2015-03-17 | Disposition: A | Discharge status: Home / Self Care | Payer: BLUE CROSS/BLUE SHIELD | Source: Ambulatory Visit | Attending: Oncology | Admitting: Oncology

## 2015-03-17 ENCOUNTER — Encounter (HOSPITAL_COMMUNITY)
Admission: RE | Admit: 2015-03-17 | Discharge: 2015-03-17 | Disposition: A | Discharge status: Home / Self Care | Payer: BLUE CROSS/BLUE SHIELD | Source: Ambulatory Visit | Attending: Oncology | Admitting: Oncology

## 2015-03-17 ENCOUNTER — Other Ambulatory Visit: Payer: Self-pay | Admitting: Oncology

## 2015-03-17 DIAGNOSIS — Z90710 Acquired absence of both cervix and uterus: Secondary | ICD-10-CM | POA: Insufficient documentation

## 2015-03-17 DIAGNOSIS — Z9011 Acquired absence of right breast and nipple: Secondary | ICD-10-CM | POA: Diagnosis not present

## 2015-03-17 DIAGNOSIS — M898X0 Other specified disorders of bone, multiple sites: Secondary | ICD-10-CM | POA: Diagnosis not present

## 2015-03-17 DIAGNOSIS — R0789 Other chest pain: Secondary | ICD-10-CM | POA: Insufficient documentation

## 2015-03-17 DIAGNOSIS — C50411 Malignant neoplasm of upper-outer quadrant of right female breast: Secondary | ICD-10-CM

## 2015-03-17 DIAGNOSIS — Z853 Personal history of malignant neoplasm of breast: Secondary | ICD-10-CM | POA: Diagnosis not present

## 2015-03-17 MED ORDER — IOHEXOL 300 MG/ML  SOLN
80.0000 mL | Freq: Once | INTRAMUSCULAR | Status: AC | PRN
  Administered 2015-03-17: 100 mL via INTRAVENOUS

## 2015-03-17 MED ORDER — TECHNETIUM TC 99M MEDRONATE IV KIT
25.7000 | PACK | Freq: Once | INTRAVENOUS | Status: AC | PRN
  Administered 2015-03-17: 25.7 via INTRAVENOUS

## 2015-04-01 ENCOUNTER — Ambulatory Visit: Payer: BLUE CROSS/BLUE SHIELD | Attending: Oncology | Admitting: Physical Therapy

## 2015-04-29 ENCOUNTER — Other Ambulatory Visit: Payer: Self-pay

## 2015-04-29 ENCOUNTER — Other Ambulatory Visit: Payer: Self-pay | Admitting: *Deleted

## 2015-04-29 ENCOUNTER — Encounter: Payer: Self-pay | Admitting: Oncology

## 2015-04-29 ENCOUNTER — Other Ambulatory Visit (HOSPITAL_BASED_OUTPATIENT_CLINIC_OR_DEPARTMENT_OTHER): Payer: BLUE CROSS/BLUE SHIELD

## 2015-04-29 ENCOUNTER — Ambulatory Visit (HOSPITAL_BASED_OUTPATIENT_CLINIC_OR_DEPARTMENT_OTHER): Payer: BLUE CROSS/BLUE SHIELD | Admitting: Oncology

## 2015-04-29 VITALS — BP 144/86 | HR 108 | Temp 99.1°F | Resp 18 | Ht 68.0 in | Wt 188.7 lb

## 2015-04-29 DIAGNOSIS — Z79811 Long term (current) use of aromatase inhibitors: Secondary | ICD-10-CM | POA: Diagnosis not present

## 2015-04-29 DIAGNOSIS — L03113 Cellulitis of right upper limb: Secondary | ICD-10-CM

## 2015-04-29 DIAGNOSIS — Z170 Estrogen receptor positive status [ER+]: Secondary | ICD-10-CM

## 2015-04-29 DIAGNOSIS — C50411 Malignant neoplasm of upper-outer quadrant of right female breast: Secondary | ICD-10-CM

## 2015-04-29 DIAGNOSIS — L03111 Cellulitis of right axilla: Secondary | ICD-10-CM

## 2015-04-29 DIAGNOSIS — L0390 Cellulitis, unspecified: Secondary | ICD-10-CM | POA: Insufficient documentation

## 2015-04-29 LAB — CBC WITH DIFFERENTIAL/PLATELET
BASO%: 0.3 % (ref 0.0–2.0)
Basophils Absolute: 0 10*3/uL (ref 0.0–0.1)
EOS ABS: 0 10*3/uL (ref 0.0–0.5)
EOS%: 0 % (ref 0.0–7.0)
HEMATOCRIT: 41.1 % (ref 34.8–46.6)
HGB: 14 g/dL (ref 11.6–15.9)
LYMPH#: 1.3 10*3/uL (ref 0.9–3.3)
LYMPH%: 11.5 % — AB (ref 14.0–49.7)
MCH: 31.7 pg (ref 25.1–34.0)
MCHC: 34.1 g/dL (ref 31.5–36.0)
MCV: 93 fL (ref 79.5–101.0)
MONO#: 0.6 10*3/uL (ref 0.1–0.9)
MONO%: 5.7 % (ref 0.0–14.0)
NEUT#: 9 10*3/uL — ABNORMAL HIGH (ref 1.5–6.5)
NEUT%: 82.5 % — AB (ref 38.4–76.8)
PLATELETS: 300 10*3/uL (ref 145–400)
RBC: 4.42 10*6/uL (ref 3.70–5.45)
RDW: 13 % (ref 11.2–14.5)
WBC: 10.9 10*3/uL — ABNORMAL HIGH (ref 3.9–10.3)

## 2015-04-29 MED ORDER — PROCHLORPERAZINE MALEATE 10 MG PO TABS: ORAL_TABLET | ORAL | Status: DC

## 2015-04-29 MED ORDER — HYDROCODONE-ACETAMINOPHEN 5-500 MG PO TABS: 1.0000 | ORAL_TABLET | Freq: Four times a day (QID) | ORAL | Status: DC | PRN

## 2015-04-29 MED ORDER — CIPROFLOXACIN HCL 500 MG PO TABS: 500.0000 mg | ORAL_TABLET | Freq: Two times a day (BID) | ORAL | Status: DC

## 2015-04-29 MED ORDER — TRAMADOL HCL 50 MG PO TABS: ORAL_TABLET | ORAL | Status: DC

## 2015-04-29 MED ORDER — HYDROCOD POLST-CPM POLST ER 10-8 MG/5ML PO SUER: 5.0000 mL | Freq: Three times a day (TID) | ORAL | Status: DC | PRN

## 2015-04-29 MED ORDER — AMOXICILLIN-POT CLAVULANATE 875-125 MG PO TABS: 1.0000 | ORAL_TABLET | Freq: Two times a day (BID) | ORAL | Status: DC

## 2015-04-29 NOTE — Progress Notes (Signed)
ID: Jody Dunn   DOB: 04/27/1976  MR#: [id number]  LKT#:[id number]  PCP: Jody Dunn, CNM GYN: Jody Few, MD  SU: Jody Keens, MD OTHER MD: Jody Mount, MD;   Jody Rochester, MD [facility]);  Jody Klein, MD  CHIEF COMPLAINT:  Hx of Right Breast Cancer CURRENT TREATMENT: Venlafaxine, to start tamoxifen in September  BREAST CANCER HISTORY: From the original intake note:  Jody Dunn had a mammogram at [facility] in May of 2010 because of shooting pains in the right breast. This found no worrisome finding. In the spring of 2012, the patient at delivered her second child. She nursed but the child refused to take milk from the right breast. The patient tells me that she has been able to pump easily from the right breast, so production is not an issue.   In early June 2013, the patient felt a new lump in the right breast, and brought this to the attention of Jody Dunn at [place]. The patient had repeat mammography at [facility] 02/07/2012. This found at the breast tissue to be heterogeneously dense, which is not a surprise given that the patient is lactating. There was diffuse skin thickening. Erythema is not described. The nipple was retracted. There was no definitive peau d'orange appearance. Pleomorphic calcifications in the lateral right breast extended approximately 11 cm, without definite mass. On ultrasound there was a vague irregular hypoechoic mass measuring approximately 15 mm in the right breast, with a second ill-defined hypoechoic lobulated mass measuring approximately 17 mm.   Both these masses were biopsied 02/09/2012, and the final pathology ([id number]) showed both IIb invasive ductal carcinoma, with a prognostic panel (from the mass at 11:00) as follows: estrogen receptor positive at 90%, progesterone receptor positive at 90%, Mib-1 of 30%, and HER-2 amplification with a ratio of 4.29 by C[facility]. MRI of the breast was performed 02/15/2012 and showed the mass in the 10:00 position  to measure 3.1 cm, the one at the 11:00 position to measure 3.1 cm. There was diffuse skin thickening in the right breast and 2 enhancing abnormal-appearing lower right axillary lymph nodes measuring 2.0 and 1.8 cm respectively. There was no evidence for internal mammary adenopathy, left axillary adenopathy, or significant findings in the left breast.  Subsequent treatment is as detailed below.  INTERVAL HISTORY: Jody Dunn returns today for an unscheduled visit. She called to report that starting this morning she noted a rash on her right arm. This is spreading across her chest.  In addition She feels "bad". She has some nausea. She has had low-grade fever, up to 99.9. We asked her to come in and she came in with  Her 2 children, who were both quite concerned about her  REVIEW OF SYSTEMS:  aside from the rash and feeling poorly , she denies unusual headaches, visual changes, dizziness, or gait imbalance. There has been no cough phlegm production or pleurisy. There's been no change in bowel or bladder habits. A detailed review of systems was otherwise stable  PAST MEDICAL HISTORY: Past Medical History  Diagnosis Date  . Iron (Fe) deficiency anemia   . Asthma     exercise induced  . Back pain   . Migraines   . Lactose intolerance   . Hyperlipidemia   . Allergy   . Anxiety 09/18/2013  . Breast cancer   . Cancer     PAST SURGICAL HISTORY: Past Surgical History  Procedure Laterality Date  . Appendectomy    . Shoulder surgery    . Knee  surgery      X2  . Portacath placement  02/21/2012    Procedure: INSERTION PORT-A-CATH;  Surgeon: Jody Klein, MD;  Location: [facility];  Service: General;  Laterality: N/A;  . Skin biopsy  02/21/2012    Procedure: BIOPSY SKIN;  Surgeon: Jody Klein, MD;  Location: [facility];  Service: General;  Laterality: Right;  . Breast biopsy  02/09/2012  . Mastectomy modified radical  06/28/2012    Procedure: MASTECTOMY MODIFIED RADICAL;  Surgeon: Jody Klein, MD;  Location:  [facility];  Service: General;  Laterality: Right;  Right Modified Radical Mastectomy and Left Simple Mastectomy  . Simple mastectomy with axillary sentinel node biopsy  06/28/2012    Procedure: SIMPLE MASTECTOMY;  Surgeon: Jody Klein, MD;  Location: [facility];  Service: General;  Laterality: Left;  . Robotic assisted total hysterectomy with bilateral salpingo oopherectomy N/A 11/13/2012    Procedure: ROBOTIC ASSISTED TOTAL HYSTERECTOMY WITH BILATERAL SALPINGO OOPHORECTOMY;  Surgeon: Jody Kim, MD;  Location: [facility];  Service: Gynecology;  Laterality: N/A;  . Abdominal hysterectomy      FAMILY HISTORY Family History  Problem Relation Age of Onset  . Cancer Paternal Aunt 92    Breast cancer (half sibling, related through father)  . Spina bifida Paternal Uncle 0    died around 15-34 months of age  . Cancer Maternal Grandmother 25    colon cancer  . Dementia Maternal Grandfather   . Cancer Paternal Grandmother     breast cancer between 60-42  . Cancer Paternal Grandfather     brain cancer  . Cancer Other     Paternal grandmother's siblings had breast cancer and pancreatic cancer  . Cancer Other 78    maternal great grandmother with breast cancer   the patient's parents are alive, in their early 56s. The patient is an only child. There is a significant family history of cancer including breast, brain, and colon, detailed in our genetic counselors report. BRCA and P. 53 testing is negative  GYNECOLOGIC HISTORY:  (Reviewed 02/06/2014) Menarche age 68; she is GX P2, first live birth age 77.  She had not had periods for a long time since she had been breast-feeding, but resumed menstruating July 2013.  She stopped having menstrual cycles during neoadjuvant chemotherapy. She is status post hysterectomy and bilateral salpingo-oophorectomy in March 2014.  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 Her husband Jody Dunn "Ed" Quentin Angst. works as a  Educational psychologist for [employer]	[place]. Their children are Jody Dunn (2008) and [name] (2012).   ADVANCED DIRECTIVES: not in place  HEALTH MAINTENANCE:  (Reviewed 02/06/2014)  Social History  Substance Use Topics  . Smoking status: Never Smoker   . Smokeless tobacco: Never Used  . Alcohol Use: 2.5 - 3.5 oz/week    5-7 drink(s) per week     Comment: OCCASIONAL     Colonoscopy: never  PAP: 2012/ s/p hysterectomy in March 2014  Bone density: never  Lipid panel:  June 2014, elevated    Allergies  Allergen Reactions  . Zofran [Ondansetron Hcl] Nausea And Vomiting, Other (See Comments) and Rash    Migraine headaches Migraine headaches and vomiting  . Oxycodone Itching and Rash  . Adhesive [Tape] Hives  . Antiseptic Products, Misc.     duraprep-rash  . Clindamycin Hives  . Clindamycin/Lincomycin Hives  . Hydrocodone-Acetaminophen Hives  . Duraprep [employer], Misc.] Hives and Rash  . Penicillins Hives, Nausea And Vomiting  and Rash    vomiting    Current Outpatient Prescriptions  Medication Sig Dispense Refill  . fluocinonide-emollient (LIDEX-E) 0.05 % cream Apply 1 application topically 2 (two) times daily. 30 g 0  . ketoconazole (NIZORAL) 2 % cream Apply 1 application topically daily. 15 g 0  . lactose hydrous POWD Take by mouth.    . naproxen sodium (ANAPROX) 220 MG tablet Take 220 mg by mouth daily as needed.     . Probiotic Product (PROBIOTIC & ACIDOPHILUS EX ST) CAPS Take 1 capsule by mouth daily.     Marland Kitchen venlafaxine XR (EFFEXOR-XR) 37.5 MG 24 hr capsule Take 1 capsule (37.5 mg total) by mouth daily with breakfast. 90 capsule 4   No current facility-administered medications for this visit.    Objective: Young white woman who appears stated age  89 Vitals:   04/29/15 1620  BP: 144/86  Pulse: 108  Temp: 99.1 F (37.3 C)  Resp: 18  Body mass index is 28.7 kg/(m^2).  ECOG:  1 Filed Weights   04/29/15 1620  Weight: 188 lb 11.2 oz (85.594 kg)     There is a palpable erythematous rash which is confluent over the upper outer right arm, then with skip areas across the chest to the left midline anteriorly. There is grade 1 right upper extremity lymphedema.   LAB RESULTS: Lab Results  Component Value Date   WBC 10.9* 04/29/2015   NEUTROABS 9.0* 04/29/2015   HGB 14.0 04/29/2015   HCT 41.1 04/29/2015   MCV 93.0 04/29/2015   PLT 300 04/29/2015       Chemistry      Component Value Date/Time   NA 142 11/05/2014 1123   NA 136 05/09/2014 1346   K 4.2 11/05/2014 1123   K 4.0 05/09/2014 1346   CL 102 05/09/2014 1346   CL 105 01/25/2013 0910   CO2 26 11/05/2014 1123   CO2 24 05/09/2014 1346   BUN 11.0 11/05/2014 1123   BUN 14 05/09/2014 1346   CREATININE 0.8 11/05/2014 1123   CREATININE 0.89 05/09/2014 1346      Component Value Date/Time   CALCIUM 9.3 11/05/2014 1123   CALCIUM 9.0 05/09/2014 1346   ALKPHOS 84 11/05/2014 1123   ALKPHOS 65 05/09/2014 1346   AST 54* 11/05/2014 1123   AST 30 05/09/2014 1346   ALT 48 11/05/2014 1123   ALT 26 05/09/2014 1346   BILITOT 0.34 11/05/2014 1123   BILITOT 0.3 05/09/2014 1346       STUDIES: No results found.   ASSESSMENT: 39 y.o. BRCA-negative [place] woman   (1)  status post right breast biopsy 02/09/2012 for multifocal pT4 cN1, stage IIIB invasive ductal carcinoma. Prognostic profile from one of the 2 masses shows it to be triple positive with an MIB-1 of 30%.   (2)  s/p 4 dose dense cycles of doxorubicin/ cyclophosphamide, followed by 4 cycles of paclitaxel, dose-dense,completed 06/07/2012, given with trastuzumab   (3) trastuzumab given every 3 weeks through early June 2014, discontinued due to a drop in the ejection fraction which has recovered. Final echo 03/12/2013.  (4) s/p Right modified radical mastectomy (with prophylactic left simple mastectomy) 06/28/2012, showing a residual 2.3 cm invasive ductal carcinoma, grade 3, with 0 of 14 lymph nodes involved.  (a) Status  post bilateral breast reconstruction under the care of Dr. Isa Rankin at [facility]   (5)  Received radiation in [place] under the care of Dr. Berton Dunn, beginning January 2014 and completed in late February 2014  (  6)  Started tamoxifen March 2014, discontinued in November 2014 due to side effects   (a) started on anastrozole in December 2014 discontinued in January 2015 due to side effects  (b) restarted tamoxifen in February 2015, discontinued December 2015 due to side effects  (c) started letrozole March 2016, discontinued June 2016 due to side effects  (7)  status post total hysterectomy with bilateral salpingo-oophorectomy 11/13/2012.  (8) started venlafaxine, low-dose, in preparation for retrying tamoxifen later this year   (9) cellulitis right upper extremity diagnosed by inspection 04/29/2015  (a)  Amoxicillin/clavulanate and ciprofloxacin started 04/29/2015   PLAN:  [place]  Has developed significant cellulitis starting in her surgical arm. Frequently we treat this with cephalexin or ciprofloxacin, sometimes with doxycycline. In her case however this is moving rather rapidly and I would like to try double antibiotic. She has a history of remote penicillin allergy but has been able to take amoxicillin without difficulty. Accordingly were doing a combination of Augmentin and Cipro.   I have asked her to mark the age of the rash out tonight and also to more morning to see if it is stopping or continuing to expand. I also gave her my pager to call me in the morning and let me know how she is doing. She is going to have some stomach issues from the antibiotics. She is also having significant arm pain just a touch of the arm and we are prescribing tramadol for that.    we considered admission but I don't see an indication for admission: She is reliable, local, young, and has adequate /appropriately high white cell count. If she cannot take her medications because of nausea (we also prescribed  Compazine) she will let us know.  Otherwise I'll see her September 9.  Chauncey Cruel, MD    04/29/2015

## 2015-05-01 ENCOUNTER — Telehealth: Payer: Self-pay

## 2015-05-01 NOTE — Telephone Encounter (Signed)
Patient called to give an update on her condition.  She continues to experience headaches, arm tenderness- however states that her ROM has improved, swelling is the same and the area is warm to the touch but not "hot".  She no longer has the shakes but is vomiting periodically and is taking the prescribed compazine which seems to help.  Patient has no appetite but continues to drink fluids.  No diarrhea reported at this time.

## 2015-05-05 ENCOUNTER — Other Ambulatory Visit: Payer: Self-pay | Admitting: Oncology

## 2015-05-05 DIAGNOSIS — C50919 Malignant neoplasm of unspecified site of unspecified female breast: Secondary | ICD-10-CM

## 2015-05-06 ENCOUNTER — Telehealth: Payer: Self-pay | Admitting: Nurse Practitioner

## 2015-05-06 ENCOUNTER — Other Ambulatory Visit: Payer: Self-pay | Admitting: Nurse Practitioner

## 2015-05-06 NOTE — Telephone Encounter (Signed)
Confirmed appointment for 09/16.

## 2015-05-07 ENCOUNTER — Ambulatory Visit: Payer: BLUE CROSS/BLUE SHIELD | Admitting: Nurse Practitioner

## 2015-05-07 ENCOUNTER — Other Ambulatory Visit: Payer: BLUE CROSS/BLUE SHIELD

## 2015-05-09 ENCOUNTER — Encounter: Payer: Self-pay | Admitting: Nurse Practitioner

## 2015-05-09 ENCOUNTER — Ambulatory Visit (HOSPITAL_BASED_OUTPATIENT_CLINIC_OR_DEPARTMENT_OTHER): Payer: BLUE CROSS/BLUE SHIELD | Admitting: Nurse Practitioner

## 2015-05-09 ENCOUNTER — Other Ambulatory Visit (HOSPITAL_BASED_OUTPATIENT_CLINIC_OR_DEPARTMENT_OTHER): Payer: BLUE CROSS/BLUE SHIELD

## 2015-05-09 VITALS — BP 129/80 | HR 96 | Temp 97.8°F | Resp 18 | Ht 68.0 in | Wt 188.0 lb

## 2015-05-09 DIAGNOSIS — Z170 Estrogen receptor positive status [ER+]: Secondary | ICD-10-CM

## 2015-05-09 DIAGNOSIS — C50411 Malignant neoplasm of upper-outer quadrant of right female breast: Secondary | ICD-10-CM | POA: Diagnosis not present

## 2015-05-09 DIAGNOSIS — I890 Lymphedema, not elsewhere classified: Secondary | ICD-10-CM

## 2015-05-09 DIAGNOSIS — L03113 Cellulitis of right upper limb: Secondary | ICD-10-CM | POA: Diagnosis not present

## 2015-05-09 DIAGNOSIS — Z79811 Long term (current) use of aromatase inhibitors: Secondary | ICD-10-CM | POA: Diagnosis not present

## 2015-05-09 DIAGNOSIS — L03111 Cellulitis of right axilla: Secondary | ICD-10-CM

## 2015-05-09 DIAGNOSIS — C50919 Malignant neoplasm of unspecified site of unspecified female breast: Secondary | ICD-10-CM

## 2015-05-09 LAB — CBC WITH DIFFERENTIAL/PLATELET
BASO%: 0.5 % (ref 0.0–2.0)
Basophils Absolute: 0 10*3/uL (ref 0.0–0.1)
EOS ABS: 0 10*3/uL (ref 0.0–0.5)
EOS%: 0 % (ref 0.0–7.0)
HCT: 41.4 % (ref 34.8–46.6)
HGB: 14.1 g/dL (ref 11.6–15.9)
LYMPH%: 25.7 % (ref 14.0–49.7)
MCH: 31.5 pg (ref 25.1–34.0)
MCHC: 34.1 g/dL (ref 31.5–36.0)
MCV: 92.4 fL (ref 79.5–101.0)
MONO#: 0.6 10*3/uL (ref 0.1–0.9)
MONO%: 9.6 % (ref 0.0–14.0)
NEUT%: 64.2 % (ref 38.4–76.8)
NEUTROS ABS: 4.3 10*3/uL (ref 1.5–6.5)
PLATELETS: 277 10*3/uL (ref 145–400)
RBC: 4.48 10*6/uL (ref 3.70–5.45)
RDW: 12.5 % (ref 11.2–14.5)
WBC: 6.7 10*3/uL (ref 3.9–10.3)
lymph#: 1.7 10*3/uL (ref 0.9–3.3)

## 2015-05-09 LAB — COMPREHENSIVE METABOLIC PANEL (CC13)
ALT: 65 U/L — ABNORMAL HIGH (ref 0–55)
ANION GAP: 10 meq/L (ref 3–11)
AST: 53 U/L — ABNORMAL HIGH (ref 5–34)
Albumin: 4.2 g/dL (ref 3.5–5.0)
Alkaline Phosphatase: 114 U/L (ref 40–150)
BUN: 13.6 mg/dL (ref 7.0–26.0)
CHLORIDE: 104 meq/L (ref 98–109)
CO2: 26 meq/L (ref 22–29)
Calcium: 9.7 mg/dL (ref 8.4–10.4)
Creatinine: 1 mg/dL (ref 0.6–1.1)
EGFR: 75 mL/min/{1.73_m2} — AB (ref >90)
GLUCOSE: 112 mg/dL (ref 70–140)
Potassium: 4.2 mEq/L (ref 3.5–5.1)
SODIUM: 141 meq/L (ref 136–145)
TOTAL PROTEIN: 7.8 g/dL (ref 6.4–8.3)
Total Bilirubin: 0.27 mg/dL (ref 0.20–1.20)

## 2015-05-09 NOTE — Progress Notes (Signed)
ID: Jody Dunn   DOB: 04-30-1976  MR#: [id number]  KZL#:[id number]  PCP: Artelia Laroche, CNM GYN: Brien Few, MD  SU: Coralie Keens, MD OTHER MD: Berton Mount, MD;   Stevphen Rochester, MD [facility]);  Stark Klein, MD  CHIEF COMPLAINT:  Hx of Right Breast Cancer CURRENT TREATMENT: Venlafaxine, to start tamoxifen in September  BREAST CANCER HISTORY: From the original intake note:  Jeneen had a mammogram at [facility] in May of 2010 because of shooting pains in the right breast. This found no worrisome finding. In the spring of 2012, the patient at delivered her second child. She nursed but the child refused to take milk from the right breast. The patient tells me that she has been able to pump easily from the right breast, so production is not an issue.   In early June 2013, the patient felt a new lump in the right breast, and brought this to the attention of Artelia Laroche at [facility]. The patient had repeat mammography at [facility] 02/07/2012. This found at the breast tissue to be heterogeneously dense, which is not a surprise given that the patient is lactating. There was diffuse skin thickening. Erythema is not described. The nipple was retracted. There was no definitive peau d'orange appearance. Pleomorphic calcifications in the lateral right breast extended approximately 11 cm, without definite mass. On ultrasound there was a vague irregular hypoechoic mass measuring approximately 15 mm in the right breast, with a second ill-defined hypoechoic lobulated mass measuring approximately 17 mm.   Both these masses were biopsied 02/09/2012, and the final pathology ([id number]) showed both IIb invasive ductal carcinoma, with a prognostic panel (from the mass at 11:00) as follows: estrogen receptor positive at 90%, progesterone receptor positive at 90%, Mib-1 of 30%, and HER-2 amplification with a ratio of 4.29 by CISH. MRI of the breast was performed 02/15/2012 and showed the mass in the 10:00 position  to measure 3.1 cm, the one at the 11:00 position to measure 3.1 cm. There was diffuse skin thickening in the right breast and 2 enhancing abnormal-appearing lower right axillary lymph nodes measuring 2.0 and 1.8 cm respectively. There was no evidence for internal mammary adenopathy, left axillary adenopathy, or significant findings in the left breast.  Subsequent treatment is as detailed below.  INTERVAL HISTORY: Grace returns today for follow up of her breast cancer and right arm cellulitis. She completed her prescribed course of augmentin and cipro and her cellulitis is now resolved. She has no fever, rash, or warmth. Her right arm is still somewhat tender however.   REVIEW OF SYSTEMS: The antibiotics gave [name] some nausea as well as diarrhea. She is staying well hydrated and using compazine as needed. Her appetite is back to normal. She is battling a sore throat from a virus she thinks she got from her husband. A detailed review of systems is otherwise stable, except where noted above.  PAST MEDICAL HISTORY: Past Medical History  Diagnosis Date  . Iron (Fe) deficiency anemia   . Asthma     exercise induced  . Back pain   . Migraines   . Lactose intolerance   . Hyperlipidemia   . Allergy   . Anxiety 09/18/2013  . Breast cancer   . Cancer     PAST SURGICAL HISTORY: Past Surgical History  Procedure Laterality Date  . Appendectomy    . Shoulder surgery    . Knee surgery      X2  . Portacath placement  02/21/2012  Procedure: INSERTION PORT-A-CATH;  Surgeon: Stark Klein, MD;  [facility];  Service: General;  Laterality: N/A;  . Skin biopsy  02/21/2012    Procedure: BIOPSY SKIN;  Surgeon: Stark Klein, MD;  [facility];  Service: General;  Laterality: Right;  . Breast biopsy  02/09/2012  . Mastectomy modified radical  06/28/2012    Procedure: MASTECTOMY MODIFIED RADICAL;  Surgeon: Stark Klein, MD;  [facility];  Service: General;  Laterality: Right;  Right Modified  Radical Mastectomy and Left Simple Mastectomy  . Simple mastectomy with axillary sentinel node biopsy  06/28/2012    Procedure: SIMPLE MASTECTOMY;  Surgeon: Stark Klein, MD;  [facility];  Service: General;  Laterality: Left;  . Robotic assisted total hysterectomy with bilateral salpingo oopherectomy N/A 11/13/2012    Procedure: ROBOTIC ASSISTED TOTAL HYSTERECTOMY WITH BILATERAL SALPINGO OOPHORECTOMY;  Surgeon: Lovenia Kim, MD;  Location: [facility];  Service: Gynecology;  Laterality: N/A;  . Abdominal hysterectomy      FAMILY HISTORY Family History  Problem Relation Age of Onset  . Cancer Paternal Aunt 18    Breast cancer (half sibling, related through father)  . Spina bifida Paternal Uncle 0    died around 32-33 months of age  . Cancer Maternal Grandmother 54    colon cancer  . Dementia Maternal Grandfather   . Cancer Paternal Grandmother     breast cancer between 42-42  . Cancer Paternal Grandfather     brain cancer  . Cancer Other     Paternal grandmother's siblings had breast cancer and pancreatic cancer  . Cancer Other 17    maternal great grandmother with breast cancer   the patient's parents are alive, in their early 45s. The patient is an only child. There is a significant family history of cancer including breast, brain, and colon, detailed in our genetic counselors report. BRCA and P. 53 testing is negative  GYNECOLOGIC HISTORY:  (Reviewed 02/06/2014) Menarche age 59; she is GX P2, first live birth age 87.  She had not had periods for a long time since she had been breast-feeding, but resumed menstruating July 2013.  She stopped having menstrual cycles during neoadjuvant chemotherapy. She is status post hysterectomy and bilateral salpingo-oophorectomy in March 2014.  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 Her husband Elbin Percell Miller "Ed" Quentin Angst. works as a Educational psychologist for [employer]	[place]. Their children are  Dyann Kief (2008) and [name] (2012).   ADVANCED DIRECTIVES: not in place  HEALTH MAINTENANCE:  (Reviewed 02/06/2014)  Social History  Substance Use Topics  . Smoking status: Never Smoker   . Smokeless tobacco: Never Used  . Alcohol Use: 2.5 - 3.5 oz/week    5-7 drink(s) per week     Comment: OCCASIONAL     Colonoscopy: never  PAP: 2012/ s/p hysterectomy in March 2014  Bone density: never  Lipid panel:  June 2014, elevated    Allergies  Allergen Reactions  . Zofran [Ondansetron Hcl] Nausea And Vomiting, Other (See Comments) and Rash    Migraine headaches Migraine headaches and vomiting  . Oxycodone Itching and Rash  . Adhesive [Tape] Hives  . Antiseptic Products, Misc.     duraprep-rash  . Clindamycin Hives  . Clindamycin/Lincomycin Hives  . Hydrocodone-Acetaminophen Hives  . Duraprep [employer], Misc.] Hives and Rash  . Penicillins Hives, Nausea And Vomiting and Rash    vomiting    Current Outpatient Prescriptions  Medication Sig Dispense  Refill  . lactose hydrous POWD Take by mouth.    . Probiotic Product (PROBIOTIC & ACIDOPHILUS EX ST) CAPS Take 1 capsule by mouth daily.     Marland Kitchen venlafaxine XR (EFFEXOR-XR) 37.5 MG 24 hr capsule Take 1 capsule (37.5 mg total) by mouth daily with breakfast. (Patient taking differently: Take 37.5 mg by mouth daily with breakfast. Pt takes 2 tablets daily- total 75 mg) 90 capsule 4  . fluocinonide-emollient (LIDEX-E) 0.05 % cream Apply 1 application topically 2 (two) times daily. (Patient not taking: Reported on 05/09/2015) 30 g 0  . ketoconazole (NIZORAL) 2 % cream Apply 1 application topically daily. (Patient not taking: Reported on 05/09/2015) 15 g 0  . naproxen sodium (ANAPROX) 220 MG tablet Take 220 mg by mouth daily as needed.     . prochlorperazine (COMPAZINE) 10 MG tablet Take one tablet three times daily prn for nausea and vomiting. (Patient not taking: Reported on 05/09/2015) 60 tablet 0   No current facility-administered  medications for this visit.    Objective: Young white woman who appears stated age  33 Vitals:   05/09/15 0958  BP: 129/80  Pulse: 96  Temp: 97.8 F (36.6 C)  Resp: 18  Body mass index is 28.59 kg/(m^2).  ECOG:  1 Filed Weights   05/09/15 0958  Weight: 188 lb (85.276 kg)   Skin: warm, dry, right arm rash no longer present HEENT: sclerae anicteric, conjunctivae pink, oropharynx clear. No thrush or mucositis.  Lymph Nodes: No cervical or supraclavicular lymphadenopathy  Lungs: clear to auscultation bilaterally, no rales, wheezes, or rhonci  Heart: regular rate and rhythm  Abdomen: round, soft, non tender, positive bowel sounds  Musculoskeletal: No focal spinal tenderness, grade 1 right upper extremity lymphedema Neuro: non focal, well oriented, positive affect  Breasts: bilateral breasts status post TRAM flap reconstruction. No evidence of recurrent disease. Bilateral axillae benign.    LAB RESULTS: Lab Results  Component Value Date   WBC 6.7 05/09/2015   NEUTROABS 4.3 05/09/2015   HGB 14.1 05/09/2015   HCT 41.4 05/09/2015   MCV 92.4 05/09/2015   PLT 277 05/09/2015       Chemistry      Component Value Date/Time   NA 141 05/09/2015 0948   NA 136 05/09/2014 1346   K 4.2 05/09/2015 0948   K 4.0 05/09/2014 1346   CL 102 05/09/2014 1346   CL 105 01/25/2013 0910   CO2 26 05/09/2015 0948   CO2 24 05/09/2014 1346   BUN 13.6 05/09/2015 0948   BUN 14 05/09/2014 1346   CREATININE 1.0 05/09/2015 0948   CREATININE 0.89 05/09/2014 1346      Component Value Date/Time   CALCIUM 9.7 05/09/2015 0948   CALCIUM 9.0 05/09/2014 1346   ALKPHOS 114 05/09/2015 0948   ALKPHOS 65 05/09/2014 1346   AST 53* 05/09/2015 0948   AST 30 05/09/2014 1346   ALT 65* 05/09/2015 0948   ALT 26 05/09/2014 1346   BILITOT 0.27 05/09/2015 0948   BILITOT 0.3 05/09/2014 1346       STUDIES: No results found.   ASSESSMENT: 39 y.o. BRCA-negative [place] woman   (1)  status post right  breast biopsy 02/09/2012 for multifocal pT4 cN1, stage IIIB invasive ductal carcinoma. Prognostic profile from one of the 2 masses shows it to be triple positive with an MIB-1 of 30%.   (2)  s/p 4 dose dense cycles of doxorubicin/ cyclophosphamide, followed by 4 cycles of paclitaxel, dose-dense,completed 06/07/2012, given with trastuzumab   (  3) trastuzumab given every 3 weeks through early June 2014, discontinued due to a drop in the ejection fraction which has recovered. Final echo 03/12/2013.  (4) s/p Right modified radical mastectomy (with prophylactic left simple mastectomy) 06/28/2012, showing a residual 2.3 cm invasive ductal carcinoma, grade 3, with 0 of 14 lymph nodes involved.  (a) Status post bilateral breast reconstruction under the care of Dr. Isa Rankin at [facility]   (5)  Received radiation in [place] under the care of Dr. Berton Mount, beginning January 2014 and completed in late February 2014  (6)  Started tamoxifen March 2014, discontinued in November 2014 due to side effects   (a) started on anastrozole in December 2014 discontinued in January 2015 due to side effects  (b) restarted tamoxifen in February 2015, discontinued December 2015 due to side effects  (c) started letrozole March 2016, discontinued June 2016 due to side effects  (7)  status post total hysterectomy with bilateral salpingo-oophorectomy 11/13/2012.  (8) started venlafaxine, low-dose, in preparation for retrying tamoxifen later this year   (9) cellulitis right upper extremity diagnosed by inspection 04/29/2015  (a)  Amoxicillin/clavulanate and ciprofloxacin started 04/29/2015   PLAN:  [name] looks and feels well today. The labs were reviewed in detail and were stable. Her white blood count is back to normal. She is fever free and her cellulitis has resolved.   I reordered her referral to physical therapy for the lymphedema clinic.  She continues to tolerate the venlafaxine well. She will meet with Dr.  Jana Hakim this December to discuss returning to tamoxifen. She understands and agrees with this plan. She knows the goal of treatment in her case is cure. She has been encouraged to call with any issues that might arise before her next visit here.   Laurie Panda, NP    05/09/2015

## 2015-05-27 ENCOUNTER — Ambulatory Visit: Payer: BLUE CROSS/BLUE SHIELD | Attending: Oncology | Admitting: Physical Therapy

## 2015-05-27 DIAGNOSIS — I972 Postmastectomy lymphedema syndrome: Secondary | ICD-10-CM

## 2015-05-27 NOTE — Patient Instructions (Signed)
Remove kinesiotape at any sign of itching or irritation of skin

## 2015-05-27 NOTE — Therapy (Signed)
[place], [place], [address] Phone: [phone]   Fax:  [phone]  Physical Therapy Evaluation  Patient Details  Name: Jody Dunn MRN: [id number] Date of Birth: 08/09/1976 Referring Provider:  Laurie Panda, NP  Encounter Date: 05/27/2015      PT End of Session - 05/27/15 1418    Visit Number 1   Number of Visits 13   Date for PT Re-Evaluation 07/04/15   PT Start Time 1300   PT Stop Time 1345   PT Time Calculation (min) 45 min   Activity Tolerance Patient tolerated treatment well   Behavior During Therapy [facility] for tasks assessed/performed      Past Medical History  Diagnosis Date  . Iron (Fe) deficiency anemia   . Asthma     exercise induced  . Back pain   . Migraines   . Lactose intolerance   . Hyperlipidemia   . Allergy   . Anxiety 09/18/2013  . Breast cancer   . Cancer     Past Surgical History  Procedure Laterality Date  . Appendectomy    . Shoulder surgery    . Knee surgery      X2  . Portacath placement  02/21/2012    Procedure: INSERTION PORT-A-CATH;  Surgeon: Stark Klein, MD;  [facility];  Service: General;  Laterality: N/A;  . Skin biopsy  02/21/2012    Procedure: BIOPSY SKIN;  Surgeon: Stark Klein, MD;  [facility];  Service: General;  Laterality: Right;  . Breast biopsy  02/09/2012  . Mastectomy modified radical  06/28/2012    Procedure: MASTECTOMY MODIFIED RADICAL;  Surgeon: Stark Klein, MD;  [facility];  Service: General;  Laterality: Right;  Right Modified Radical Mastectomy and Left Simple Mastectomy  . Simple mastectomy with axillary sentinel node biopsy  06/28/2012    Procedure: SIMPLE MASTECTOMY;  Surgeon: Stark Klein, MD;  [facility];  Service: General;  Laterality: Left;  . Robotic assisted total hysterectomy with bilateral salpingo oopherectomy N/A 11/13/2012    Procedure: ROBOTIC ASSISTED TOTAL HYSTERECTOMY WITH BILATERAL SALPINGO  OOPHORECTOMY;  Surgeon: Lovenia Kim, MD;  Location: [facility];  Service: Gynecology;  Laterality: N/A;  . Abdominal hysterectomy      There were no vitals filed for this visit.  Visit Diagnosis:  Lymphedema syndrome, postmastectomy      Subjective Assessment - 05/27/15 1303    Subjective "Its bad"  pt says lymphedema is worse than its ever been since she has had cellulitis    Pertinent History  bilateral mastectomy with lot of nodes removed . chemo and radiation 2013  with develpment of lymphedema about one year ago.  DIEP flap reconstuction in [facility] in 2014.  Developed cellulitis in right upper  arm  Sept 6, 2016  and was on 2 different antibiotics for about 10 days.  She has not been wearing her garments or use her pump since then.  She has a Flexiotouch(got it about a year ago)  that she uses about 3 times a week.... at least the arm and chest portion.  She has a custom sleeve that does not fit  right now and has 2 lymphediva sleeves  that do not  work,  She has a Reid sleeve,    Patient Stated Goals to get arm back down so that she can wear her sleeve   Currently in Pain? Yes   Pain Score 2    Pain Location Arm  tightness and discomfort   Pain Orientation Right   Pain Descriptors / Indicators Tightness   Pain Type Acute pain   Pain Onset 1 to 4 weeks ago            [facility] PT Assessment - 05/27/15 0001    Assessment   Medical Diagnosis breast cancer   Hand Dominance Right   Precautions   Precautions Other (comment)  cancer    Restrictions   Weight Bearing Restrictions No   Balance Screen   Has the patient fallen in the past 6 months Yes   How many times? 1  fell on a dog toy   Has the patient had a decrease in activity level because of a fear of falling?  No   Is the patient reluctant to leave their home because of a fear of falling?  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    Leisure lymphedema gets worse when she runs: does not regularly exercise   Cognition   Overall Cognitive Status Within Functional Limits for tasks assessed   Observation/Other Assessments   Observations pt comes in with visibly larger right arm compared to left    Skin Integrity open area at left ribs that she injured when she fell on a dog toy earlier this week    Sensation   Light Touch Appears Intact   Coordination   Gross Motor Movements are Fluid and Coordinated Yes   Other:   Other/ Comments lymphedema life inpact scale : 27 showing 40% impairment from lymphedema     Posture/Postural Control   Posture/Postural Control Postural limitations   Postural Limitations Rounded Shoulders;Forward head   AROM   Overall AROM  Within functional limits for tasks performed  no loss of shoulder ROM   Strength   Overall Strength Comments can move against gravity without restriction,but feels it takes more effort to lift heavy right arm    Palpation   Palpation comment pt with some firmness (not painful) at anterior chest            LYMPHEDEMA/ONCOLOGY QUESTIONNAIRE - 05/27/15 1405    Type   Cancer Type right breast    Surgeries   Mastectomy Date 06/28/12   Axillary Lymph Node Dissection Date 06/28/12   Number Lymph Nodes Removed --  down to level 2 nodes   Treatment   Past Chemotherapy Treatment Yes   Past Radiation Treatment Yes   What other symptoms do you have   Are you Having Heaviness or Tightness Yes   Are you having pitting edema Yes   Is it Hard or Difficult finding clothes that fit Yes   Do you have infections Yes   Comments last month   Is there Decreased scar mobility No   Stemmer Sign No   Other Symptoms 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     Lymphedema Stage   Stage STAGE 2 SPONTANEOUSLY IRREVERSIBLE   Right Upper Extremity Lymphedema   15 cm Proximal to Olecranon Process 32.8 cm   10 cm Proximal to Olecranon Process 31.2 cm   Olecranon Process 26.9 cm   15 cm Proximal to Ulnar Styloid Process 28.1 cm   10  cm Proximal to Ulnar Styloid Process 25.5 cm   Just Proximal to Ulnar Styloid Process 19.8 cm   Across Hand at [employer] 20 cm   At [place] of 2nd Digit 6 cm   Left Upper Extremity Lymphedema   15 cm Proximal to Olecranon Process 31 cm   10 cm Proximal to Olecranon Process 29 cm   Olecranon Process 24.5 cm   15 cm Proximal to Ulnar Styloid Process 24.1 cm   10 cm Proximal to Ulnar Styloid Process 20.5 cm   Just Proximal to Ulnar Styloid Process 15 cm   Across Hand at [employer] 18.1 cm   At [place] of 2nd Digit 5.8 cm           Quick Dash - 05/27/15 0001    Open a tight or new jar Moderate difficulty   Do heavy household chores (wash walls, wash floors) Moderate difficulty   Carry a shopping bag or briefcase Moderate difficulty   Wash your back Mild difficulty   Use a knife to cut food Mild difficulty   Recreational activities in which you take some force or impact through your arm, shoulder, or hand (golf, hammering, tennis) Moderate difficulty   During the past week, to what extent has your arm, shoulder or hand problem interfered with your normal social activities with family, friends, neighbors, or groups? Quite a bit   During the past week, to what extent has your arm, shoulder or hand problem limited your work or other regular daily activities Modererately   Tingling (pins and needles) in your arm, shoulder, or hand Mild   Difficulty Sleeping Severe difficulty   DASH Score 40.91 %             [facility] Adult PT Treatment/Exercise - 05/27/15 0001    Manual Therapy   Manual Lymphatic Drainage (MLD) brief session of short neck superficial and deep abdominals. left shoulder arm, forearm and hand with  return along pathways   Kinesiotex Edema   Kinesiotix   Edema skinkote applied followed by kinesiotape from dorsal wrist to medical elbow                PT Education - 05/27/15 1417    Education provided Yes   Education Details remove kinesiotape at first sign of skin irritation Pt states she has used kinesiotape before without difficulty   Person(s) Educated Patient   Methods Explanation;Demonstration   Comprehension Verbalized understanding                [facility] Goals - 05/27/15 1525    CC Long Term Goal  #1   Title Patient will have a circumferential reduction of     2 cm at  10    cm above the      right    ulnar styloid  ( target date 06/27/2015)    Baseline 25.5 on 05/27/2015   Time 4   Period Weeks   Status New   CC Long Term Goal  #2   Title Patient will be know how to obtain and use compression garments and /or bandaging  for maintenance phase of treatment (target date 06/27/2015)   Time 4   Period Weeks   Status New   CC Long Term Goal  #3   Title pt will decrease Lymphedema Life Impact scale by 5 points ( target 11//11/2014)   Baseline 27   Time 4   Period Weeks   Status New  CC Long Term Goal  #4   Title Patient will decrease the DASH score to < 35   to demonstrate increased functional use of upper extremity( target 06/27/2015)   Baseline 40.91   Time 4   Period Weeks   Status New            Plan - 05/27/15 1418    Clinical Impression Statement 39 yo with exacerbation of right upper extremilty lymphedema after bout of cellulitis. Began with short session of manual lymph drainage and kinesiotape today.  She would benefit from complete decongestive therapy, exporation of bandaging alternatives for home management and instruction in progressive resisitve strength program  as she does a lot of lifting in her job.    Pt will benefit from skilled therapeutic intervention in order to improve on the following deficits Impaired UE functional  use;Increased edema;Decreased strength   Rehab Potential Excellent   Clinical Impairments Affecting Rehab Potential previous surgery with axillary node dissection, radiation and chemotherapy, previous treatment for lymphedema.   PT Frequency 3x / week   PT Duration 4 weeks   PT Treatment/Interventions ADLs/Self Care Home Management;Manual lymph drainage;Compression bandaging;Therapeutic exercise;Taping;Patient/family education   PT Next Visit Plan assess effectiveness of kinesiotape. Proceed with manual lymph drainage and bandaging along with remedical exericse in badages. Explore biacare vs. farrow wraps or others with use of glove. eventual strength ABC program    Consulted and Agree with Plan of Care Patient         Problem List Patient Active Problem List   Diagnosis Date Noted  . Cellulitis 04/29/2015  . Insomnia 02/06/2014  . Incisional hernia, without obstruction or gangrene 01/25/2014  . Lymphedema of arm 11/14/2013  . Hot flashes due to surgical menopause 09/18/2013  . Anxiety 09/18/2013  . S/P bilateral salpingo-oophorectomy 07/11/2013  . Breast cancer of upper-outer quadrant of right female breast ([place]) 03/12/2013  . S/P hysterectomy 11/13/2012  . Lactose intolerance   . Migraines   . Iron (Fe) deficiency anemia   . ALLERGIC RHINITIS, SEASONAL 12/15/2006   Donato Heinz. Owens Shark, PT  05/27/2015, 4:40 PM  [place], [place], [address] Phone: [phone]   Fax:  [phone]

## 2015-05-28 ENCOUNTER — Ambulatory Visit: Payer: BLUE CROSS/BLUE SHIELD | Admitting: Physical Therapy

## 2015-05-28 DIAGNOSIS — I972 Postmastectomy lymphedema syndrome: Secondary | ICD-10-CM

## 2015-05-28 NOTE — Therapy (Signed)
[place], [place], [address] Phone: [phone]   Fax:  [phone]  Physical Therapy Treatment  Patient Details  Name: Jody Dunn MRN: [id number] Date of Birth: 08-03-1976 Referring Provider:  Laurie Panda, NP  Encounter Date: 05/28/2015      PT End of Session - 05/28/15 2036    Visit Number 2   Number of Visits 13   Date for PT Re-Evaluation 07/04/15   PT Start Time 1300   PT Stop Time 1352   PT Time Calculation (min) 52 min   Activity Tolerance Patient tolerated treatment well   Behavior During Therapy [facility] for tasks assessed/performed      Past Medical History  Diagnosis Date  . Iron (Fe) deficiency anemia   . Asthma     exercise induced  . Back pain   . Migraines   . Lactose intolerance   . Hyperlipidemia   . Allergy   . Anxiety 09/18/2013  . Breast cancer   . Cancer     Past Surgical History  Procedure Laterality Date  . Appendectomy    . Shoulder surgery    . Knee surgery      X2  . Portacath placement  02/21/2012    Procedure: INSERTION PORT-A-CATH;  Surgeon: Stark Klein, MD;  Location: [facility];  Service: General;  Laterality: N/A;  . Skin biopsy  02/21/2012    Procedure: BIOPSY SKIN;  Surgeon: Stark Klein, MD;  Location: [facility];  Service: General;  Laterality: Right;  . Breast biopsy  02/09/2012  . Mastectomy modified radical  06/28/2012    Procedure: MASTECTOMY MODIFIED RADICAL;  Surgeon: Stark Klein, MD;  Location: [facility];  Service: General;  Laterality: Right;  Right Modified Radical Mastectomy and Left Simple Mastectomy  . Simple mastectomy with axillary sentinel node biopsy  06/28/2012    Procedure: SIMPLE MASTECTOMY;  Surgeon: Stark Klein, MD;  Location: [facility];  Service: General;  Laterality: Left;  . Robotic assisted total hysterectomy with bilateral salpingo oopherectomy N/A 11/13/2012    Procedure: ROBOTIC ASSISTED TOTAL HYSTERECTOMY WITH BILATERAL SALPINGO  OOPHORECTOMY;  Surgeon: Lovenia Kim, MD;  Location: [place] [facility];  Service: Gynecology;  Laterality: N/A;  . Abdominal hysterectomy      There were no vitals filed for this visit.  Visit Diagnosis:  Lymphedema syndrome, postmastectomy      Subjective Assessment - 05/28/15 1304    Subjective Nothing new.  Took kinesiotape off an hour ago because it was coming off.   Currently in Pain? No/denies               LYMPHEDEMA/ONCOLOGY QUESTIONNAIRE - 05/28/15 1305    Right Upper Extremity Lymphedema   15 cm Proximal to Ulnar Styloid Process 27.8 cm   10 cm Proximal to Ulnar Styloid Process 25.4 cm   Just Proximal to Ulnar Styloid Process 19.9 cm                  [facility] Adult PT Treatment/Exercise - 05/28/15 0001    Self-Care   Self-Care Other Self-Care Comments   Other Self-Care Comments  Gave patient info on [email or web address] as a resource to research bandage-alternative garments such as ones by Gunnar Fusi, and Medi/Circaid   Manual Therapy   Manual Therapy Compression Bandaging   Manual Lymphatic Drainage (MLD) short neck, superficial and deep abdomen, left axilla and anterior inter-axillary anastomosis, right groin and axillo-inguinal anastomosis, and right UE from fingers to shoulder.Marland Kitchen  Compression Bandaging Lotion applied, then bandaging with stockinette, Elastomull on middle three digits, Artiflex, and 1-6 cm., 1-10 cm., and 2-12 cm. short stretch bandages from hand to shoulder on right side.                PT Education - 05/28/15 2035    Education provided Yes   Education Details gave patient copy of handout on bandaging the arm   Person(s) Educated Patient   Methods Handout   Comprehension Verbalized understanding                [facility] Goals - 05/27/15 1525    CC Long Term Goal  #1   Title Patient will have a circumferential reduction of     2 cm at  10    cm above the      right    ulnar styloid  ( target date  06/27/2015)    Baseline 25.5 on 05/27/2015   Time 4   Period Weeks   Status New   CC Long Term Goal  #2   Title Patient will be know how to obtain and use compression garments and /or bandaging  for maintenance phase of treatment (target date 06/27/2015)   Time 4   Period Weeks   Status New   CC Long Term Goal  #3   Title pt will decrease Lymphedema Life Impact scale by 5 points ( target 11//11/2014)   Baseline 27   Time 4   Period Weeks   Status New   CC Long Term Goal  #4   Title Patient will decrease the DASH score to < 35   to demonstrate increased functional use of upper extremity( target 06/27/2015)   Baseline 40.91   Time 4   Period Weeks   Status New            Plan - 05/28/15 2036    Clinical Impression Statement Patient was skeptical about fourth bandage being applied but wanted to go ahead with it; was advised to remove that one if uncomfortable (claustrophobic).  Patient does not have appointments consistently 3x/week, so she plans to rebandage on her own if she does not have appointments for several days.  She will research bandage alternatives on the web to see if/what she might be interested in there.   Pt will benefit from skilled therapeutic intervention in order to improve on the following deficits Impaired UE functional use;Increased edema;Decreased strength   Rehab Potential Excellent   Clinical Impairments Affecting Rehab Potential previous surgery with axillary node dissection, radiation and chemotherapy, previous treatment for lymphedema.   PT Frequency 3x / week   PT Duration 4 weeks   PT Treatment/Interventions Manual techniques;Manual lymph drainage;Compression bandaging;Patient/family education   PT Next Visit Plan continue complete decongestive therapy; ask patient if she researched and formed an opinion on bandage alternatives   Consulted and Agree with Plan of Care Patient        Problem List Patient Active Problem List   Diagnosis Date Noted  .  Cellulitis 04/29/2015  . Insomnia 02/06/2014  . Incisional hernia, without obstruction or gangrene 01/25/2014  . Lymphedema of arm 11/14/2013  . Hot flashes due to surgical menopause 09/18/2013  . Anxiety 09/18/2013  . S/P bilateral salpingo-oophorectomy 07/11/2013  . Breast cancer of upper-outer quadrant of right female breast ([place]) 03/12/2013  . S/P hysterectomy 11/13/2012  . Lactose intolerance   . Migraines   . Iron (Fe) deficiency anemia   . ALLERGIC  RHINITIS, SEASONAL 12/15/2006    SALISBURY,DONNA 05/28/2015, 8:41 PM  [place], [place], [address] Phone: [phone]   Fax:  [place], PT 05/28/2015 8:41 PM

## 2015-06-02 ENCOUNTER — Encounter: Payer: Self-pay | Admitting: Physical Therapy

## 2015-06-02 ENCOUNTER — Ambulatory Visit: Payer: BLUE CROSS/BLUE SHIELD | Admitting: Physical Therapy

## 2015-06-02 DIAGNOSIS — I972 Postmastectomy lymphedema syndrome: Secondary | ICD-10-CM | POA: Diagnosis not present

## 2015-06-02 NOTE — Therapy (Signed)
[place], [place], [address] Phone: [phone]   Fax:  [phone]  Physical Therapy Treatment  Patient Details  Name: Jody Dunn MRN: [id number] Date of Birth: 08/14/1976 Referring Provider:  Laurie Panda, NP  Encounter Date: 06/02/2015      PT End of Session - 06/02/15 1655    Visit Number 3   Number of Visits 13   Date for PT Re-Evaluation 07/04/15   PT Start Time 1346   PT Stop Time 1432   PT Time Calculation (min) 46 min   Activity Tolerance Patient tolerated treatment well   Behavior During Therapy [facility] for tasks assessed/performed      Past Medical History  Diagnosis Date  . Iron (Fe) deficiency anemia   . Asthma     exercise induced  . Back pain   . Migraines   . Lactose intolerance   . Hyperlipidemia   . Allergy   . Anxiety 09/18/2013  . Breast cancer ([facility])   . Cancer [facility])     Past Surgical History  Procedure Laterality Date  . Appendectomy    . Shoulder surgery    . Knee surgery      X2  . Portacath placement  02/21/2012    Procedure: INSERTION PORT-A-CATH;  Surgeon: Stark Klein, MD;  [facility];  Service: General;  Laterality: N/A;  . Skin biopsy  02/21/2012    Procedure: BIOPSY SKIN;  Surgeon: Stark Klein, MD;  [facility]: [facility];  Service: General;  Laterality: Right;  . Breast biopsy  02/09/2012  . Mastectomy modified radical  06/28/2012    Procedure: MASTECTOMY MODIFIED RADICAL;  Surgeon: Stark Klein, MD;  [facility]: [facility];  Service: General;  Laterality: Right;  Right Modified Radical Mastectomy and Left Simple Mastectomy  . Simple mastectomy with axillary sentinel node biopsy  06/28/2012    Procedure: SIMPLE MASTECTOMY;  Surgeon: Stark Klein, MD;  [facility];  Service: General;  Laterality: Left;  . Robotic assisted total hysterectomy with bilateral salpingo oopherectomy N/A 11/13/2012    Procedure: ROBOTIC ASSISTED TOTAL HYSTERECTOMY WITH  BILATERAL SALPINGO OOPHORECTOMY;  Surgeon: Lovenia Kim, MD;  Location: [place] [facility];  Service: Gynecology;  Laterality: N/A;  . Abdominal hysterectomy      There were no vitals filed for this visit.  Visit Diagnosis:  Lymphedema syndrome, postmastectomy      Subjective Assessment - 06/02/15 1353    Subjective Brought my sleeve so you can check it.  No pain in my arm.   Pertinent History  bilateral mastectomy with lot of nodes removed . chemo and radiation 2013  with develpment of lymphedema about one year ago.  DIEP flap reconstuction in [facility] in 2014.  Developed cellulitis in right upper  arm  Sept 6, 2016  and was on 2 different antibiotics for about 10 days.  She has not been wearing her garments or use her pump since then.  She has a Flexiotouch(got it about a year ago)  that she uses about 3 times a week.... at least the arm and chest portion.  She has a custom sleeve that does not fit  right now and has 2 lymphediva sleeves  that do not  work,  She has a Reid sleeve,    Patient Stated Goals to get arm back down so that she can wear her sleeve   Currently in Pain? Yes   Pain Score 2    Pain Location Rib cage  Pain Orientation Left   Pain Descriptors / Indicators Tightness   Pain Type Acute pain   Pain Onset 1 to 4 weeks ago                         [facility] Adult PT Treatment/Exercise - 06/02/15 0001    Manual Therapy   Manual Lymphatic Drainage (MLD) short neck, superficial and deep abdomen, left axilla and anterior inter-axillary anastomosis, right groin and axillo-inguinal anastomosis, and right UE from fingers to shoulder..   Compression Bandaging Lotion applied, then bandaging with stockinette, Elastomull on middle three digits, Artiflex, and 1-6 cm., 1-10 cm., and 2-12 cm. short stretch bandages from hand to shoulder on right side.                        [facility] Goals - 05/27/15 [place] Term Goal  #1   Title Patient will  have a circumferential reduction of     2 cm at  10    cm above the      right    ulnar styloid  ( target date 06/27/2015)    Baseline 25.5 on 05/27/2015   Time 4   Period Weeks   Status New   CC Long Term Goal  #2   Title Patient will be know how to obtain and use compression garments and /or bandaging  for maintenance phase of treatment (target date 06/27/2015)   Time 4   Period Weeks   Status New   CC Long Term Goal  #3   Title pt will decrease Lymphedema Life Impact scale by 5 points ( target 11//11/2014)   Baseline 27   Time 4   Period Weeks   Status New   CC Long Term Goal  #4   Title Patient will decrease the DASH score to < 35   to demonstrate increased functional use of upper extremity( target 06/27/2015)   Baseline 40.91   Time 4   Period Weeks   Status New            Plan - 06/02/15 1655    Clinical Impression Statement Patient's arm appears to be tolerating treatment well.  She reports wanting to use a bandaging alternative due to her anxiety while wrapped, especially at night.  She will benefit from continued treatment to further reduce her arm.   Pt will benefit from skilled therapeutic intervention in order to improve on the following deficits Impaired UE functional use;Increased edema;Decreased strength   Rehab Potential Excellent   Clinical Impairments Affecting Rehab Potential previous surgery with axillary node dissection, radiation and chemotherapy, previous treatment for lymphedema.   PT Frequency 3x / week   PT Duration 4 weeks   PT Treatment/Interventions Manual techniques;Manual lymph drainage;Compression bandaging;Patient/family education   PT Next Visit Plan Patient plans to call Rexford Maus to explore getting a bandaging alternative.  Continue complete decongestive therapy.   Consulted and Agree with Plan of Care Patient        Problem List Patient Active Problem List   Diagnosis Date Noted  . Cellulitis 04/29/2015  . Insomnia 02/06/2014  .  Incisional hernia, without obstruction or gangrene 01/25/2014  . Lymphedema of arm 11/14/2013  . Hot flashes due to surgical menopause 09/18/2013  . Anxiety 09/18/2013  . S/P bilateral salpingo-oophorectomy 07/11/2013  . Breast cancer of upper-outer quadrant of right female breast ([place]) 03/12/2013  . S/P hysterectomy  11/13/2012  . Lactose intolerance   . Migraines   . Iron (Fe) deficiency anemia   . ALLERGIC RHINITIS, SEASONAL 12/15/2006    Annia Friendly, PT 06/02/2015 5:02 PM  [place], [place], [address] Phone: [phone]   Fax:  [phone]

## 2015-06-04 ENCOUNTER — Telehealth: Payer: Self-pay | Admitting: *Deleted

## 2015-06-04 ENCOUNTER — Ambulatory Visit: Payer: BLUE CROSS/BLUE SHIELD | Admitting: Physical Therapy

## 2015-06-04 NOTE — Telephone Encounter (Signed)
Received vm message from pt @ 9:02 am stating she is having 'extreme rib pain'.  Return call to her revealed that pt tripped over her dog 1 week ago and fell against a window sill, hitting her back. She c/o continued rib pain in her back where the injury occurred and now radiates to the front of her rib cage. She is taking Aleve otc and tramadol but not getting much relief.  She is wondering if she needs a chest xray etc. Please advise patient of next steps.

## 2015-06-05 NOTE — Telephone Encounter (Signed)
Returned patient's call this morning regarding her rib pain LVM to return my call.

## 2015-06-10 ENCOUNTER — Ambulatory Visit: Payer: BLUE CROSS/BLUE SHIELD

## 2015-06-13 ENCOUNTER — Ambulatory Visit: Payer: BLUE CROSS/BLUE SHIELD | Admitting: Physical Therapy

## 2015-06-16 ENCOUNTER — Ambulatory Visit: Payer: BLUE CROSS/BLUE SHIELD | Admitting: Physical Therapy

## 2015-06-16 ENCOUNTER — Telehealth: Payer: Self-pay | Admitting: Physical Therapy

## 2015-06-16 NOTE — Telephone Encounter (Signed)
Phoned patient because she was on my schedule but was not here.  She said she had asked secretary to cancel appointments for the time being.  Had a rib fracture and inflammation related to that; sees MD within the week and will see if she is okayed to return to therapy.  Meanwhile, she met with [facility]oyle Sauer Alexander's group and will be getting a Farrow Wrap bandaging alternative for her arm and then, once reduced, she will be measured for a custom sleeve.

## 2015-06-19 ENCOUNTER — Encounter: Payer: BLUE CROSS/BLUE SHIELD | Admitting: Physical Therapy

## 2015-08-05 ENCOUNTER — Encounter: Payer: Self-pay | Admitting: Oncology

## 2015-08-05 ENCOUNTER — Ambulatory Visit: Payer: BLUE CROSS/BLUE SHIELD | Admitting: Oncology

## 2015-08-24 HISTORY — PX: CHEILECTOMY: SHX1336

## 2015-10-27 ENCOUNTER — Telehealth: Payer: Self-pay | Admitting: Oncology

## 2015-10-27 NOTE — Telephone Encounter (Signed)
Patient called 3/6 to cxl appt due to pt has issurance issues at the moment; will call back t r/s at later date

## 2015-10-31 ENCOUNTER — Ambulatory Visit: Payer: BLUE CROSS/BLUE SHIELD | Admitting: Oncology

## 2015-10-31 ENCOUNTER — Other Ambulatory Visit: Payer: BLUE CROSS/BLUE SHIELD

## 2016-01-06 ENCOUNTER — Encounter: Payer: Self-pay | Admitting: Adult Health

## 2016-01-06 NOTE — Progress Notes (Signed)
A birthday card was mailed to the patient today on behalf of the Survivorship Program at [facility].   Perseus Westall, NP Survivorship Program [facility] [phone]  

## 2016-01-28 ENCOUNTER — Telehealth: Payer: Self-pay | Admitting: Nurse Practitioner

## 2016-01-28 ENCOUNTER — Other Ambulatory Visit: Payer: Self-pay | Admitting: *Deleted

## 2016-01-28 ENCOUNTER — Encounter: Payer: Self-pay | Admitting: Nurse Practitioner

## 2016-01-28 ENCOUNTER — Ambulatory Visit (HOSPITAL_BASED_OUTPATIENT_CLINIC_OR_DEPARTMENT_OTHER): Payer: BLUE CROSS/BLUE SHIELD | Admitting: Nurse Practitioner

## 2016-01-28 ENCOUNTER — Telehealth: Payer: Self-pay | Admitting: *Deleted

## 2016-01-28 ENCOUNTER — Ambulatory Visit (HOSPITAL_BASED_OUTPATIENT_CLINIC_OR_DEPARTMENT_OTHER): Payer: BLUE CROSS/BLUE SHIELD

## 2016-01-28 VITALS — BP 143/105 | HR 96 | Temp 98.9°F | Resp 18 | Ht 68.0 in | Wt 195.1 lb

## 2016-01-28 VITALS — BP 177/92 | HR 97 | Temp 98.6°F | Resp 18

## 2016-01-28 DIAGNOSIS — L03113 Cellulitis of right upper limb: Secondary | ICD-10-CM

## 2016-01-28 DIAGNOSIS — C50411 Malignant neoplasm of upper-outer quadrant of right female breast: Secondary | ICD-10-CM

## 2016-01-28 DIAGNOSIS — L03115 Cellulitis of right lower limb: Secondary | ICD-10-CM

## 2016-01-28 DIAGNOSIS — L03111 Cellulitis of right axilla: Secondary | ICD-10-CM

## 2016-01-28 DIAGNOSIS — Z853 Personal history of malignant neoplasm of breast: Secondary | ICD-10-CM

## 2016-01-28 MED ORDER — HYDROMORPHONE HCL 1 MG/ML IJ SOLN
1.0000 mg | Freq: Once | INTRAMUSCULAR | Status: AC
  Administered 2016-01-28: 1 mg via INTRAVENOUS
  Filled 2016-01-28: qty 1

## 2016-01-28 MED ORDER — AMOXICILLIN-POT CLAVULANATE 875-125 MG PO TABS: 1.0000 | ORAL_TABLET | Freq: Two times a day (BID) | ORAL | Status: DC

## 2016-01-28 MED ORDER — VANCOMYCIN HCL 1000 MG IV SOLR
1500.0000 mg | Freq: Once | INTRAVENOUS | Status: AC
  Administered 2016-01-28: 1500 mg via INTRAVENOUS
  Filled 2016-01-28: qty 1500

## 2016-01-28 MED ORDER — SODIUM CHLORIDE 0.9 % IV SOLN
Freq: Once | INTRAVENOUS | Status: AC
  Administered 2016-01-28: 15:00:00 via INTRAVENOUS

## 2016-01-28 MED ORDER — TRAMADOL HCL 50 MG PO TABS: 50.0000 mg | ORAL_TABLET | Freq: Four times a day (QID) | ORAL | Status: DC | PRN

## 2016-01-28 MED ORDER — CIPROFLOXACIN HCL 500 MG PO TABS: 500.0000 mg | ORAL_TABLET | Freq: Two times a day (BID) | ORAL | Status: DC

## 2016-01-28 MED ORDER — HYDROMORPHONE HCL 4 MG/ML IJ SOLN
INTRAMUSCULAR | Status: AC
  Filled 2016-01-28: qty 1

## 2016-01-28 NOTE — Progress Notes (Signed)
Patient presented to the infusion room stating she has pain in her right arm associated with cellulitis that is a 7/10 on the pain scale. BP 177/92 mmHg  Pulse 116  Temp(Src) 98.6 F (37 C) (Oral)  Resp 18  SpO2 100%  LMP 10/24/2012. Contacted Dr. Jana Hakim who gave order for 1mg  of Dilaudid IV.  Ma Hillock, RN

## 2016-01-28 NOTE — Telephone Encounter (Signed)
Per staff message and POF I have scheduled appts. Advised scheduler of appts. JMW  

## 2016-01-28 NOTE — Telephone Encounter (Signed)
Call received from patient reporting she "called [employer] after hours.  A message was left for [place] to call her with an appointment today.  I have Cellulitis to my right arm.  I had this in September and need antibiotics." No record communication from [employer] at this time.  Call transferred to collaborative nurse

## 2016-01-28 NOTE — Telephone Encounter (Signed)
Pt called and left message re: pt develops cellulitis and requested to come in now.  Attempted to call pt back .  Left message on voice mail requesting a call back from pt. Pt's   Phone     [phone].

## 2016-01-28 NOTE — Telephone Encounter (Signed)
Per transfer of call- this RN discussed pt's concern and scheduled her for an appointment today with NP.

## 2016-01-28 NOTE — Telephone Encounter (Signed)
appt made per HB 6/7 pof and avs printed

## 2016-01-28 NOTE — Progress Notes (Signed)
ID: Jody Dunn   DOB: June 07, 1976  MR#: [id number]  IEP#:[id number]  PCP: Artelia Laroche, CNM GYN: Brien Few, MD  SU: Coralie Keens, MD OTHER MD: Berton Mount, MD;   Stevphen Rochester, MD [facility]);  Stark Klein, MD  CHIEF COMPLAINT:  Hx of Right Breast Cancer CURRENT TREATMENT: Venlafaxine, to start tamoxifen in September  BREAST CANCER HISTORY: From the original intake note:  Jody Dunn had a mammogram at [facility] in May of 2010 because of shooting pains in the right breast. This found no worrisome finding. In the spring of 2012, the patient at delivered her second child. She nursed but the child refused to take milk from the right breast. The patient tells me that she has been able to pump easily from the right breast, so production is not an issue.   In early June 2013, the patient felt a new lump in the right breast, and brought this to the attention of Artelia Laroche at [place]. The patient had repeat mammography at [facility] 02/07/2012. This found at the breast tissue to be heterogeneously dense, which is not a surprise given that the patient is lactating. There was diffuse skin thickening. Erythema is not described. The nipple was retracted. There was no definitive peau d'orange appearance. Pleomorphic calcifications in the lateral right breast extended approximately 11 cm, without definite mass. On ultrasound there was a vague irregular hypoechoic mass measuring approximately 15 mm in the right breast, with a second ill-defined hypoechoic lobulated mass measuring approximately 17 mm.   Both these masses were biopsied 02/09/2012, and the final pathology ([id number]) showed both IIb invasive ductal carcinoma, with a prognostic panel (from the mass at 11:00) as follows: estrogen receptor positive at 90%, progesterone receptor positive at 90%, Mib-1 of 30%, and HER-2 amplification with a ratio of 4.29 by CISH. MRI of the breast was performed 02/15/2012 and showed the mass in the 10:00 position  to measure 3.1 cm, the one at the 11:00 position to measure 3.1 cm. There was diffuse skin thickening in the right breast and 2 enhancing abnormal-appearing lower right axillary lymph nodes measuring 2.0 and 1.8 cm respectively. There was no evidence for internal mammary adenopathy, left axillary adenopathy, or significant findings in the left breast.  Subsequent treatment is as detailed below.  INTERVAL HISTORY: Jody Dunn returns today for follow up of what she believes is recurrent right arm cellulitis. She claims to have been febrile at home with chills, though she has neither today. She is slightly nauseous. The arm is red and painful to touch. It is more swollen than usual. She has a history of right upper extremity lymphedema. She is not entirely compliant with using her pump and compression sleeves.   REVIEW OF SYSTEMS:  A detailed review of systems is otherwise stable, except where noted above.  PAST MEDICAL HISTORY: Past Medical History  Diagnosis Date  . Iron (Fe) deficiency anemia   . Asthma     exercise induced  . Back pain   . Migraines   . Lactose intolerance   . Hyperlipidemia   . Allergy   . Anxiety 09/18/2013  . Breast cancer ([place])   . Cancer [facility])     PAST SURGICAL HISTORY: Past Surgical History  Procedure Laterality Date  . Appendectomy    . Shoulder surgery    . Knee surgery      X2  . Portacath placement  02/21/2012    Procedure: INSERTION PORT-A-CATH;  Surgeon: Stark Klein, MD;  Location: [facility];  Service: General;  Laterality: N/A;  . Skin biopsy  02/21/2012    Procedure: BIOPSY SKIN;  Surgeon: Stark Klein, MD;  Location: [facility];  Service: General;  Laterality: Right;  . Breast biopsy  02/09/2012  . Mastectomy modified radical  06/28/2012    Procedure: MASTECTOMY MODIFIED RADICAL;  Surgeon: Stark Klein, MD;  Location: [facility];  Service: General;  Laterality: Right;  Right Modified Radical Mastectomy and Left Simple Mastectomy  . Simple mastectomy with axillary  sentinel node biopsy  06/28/2012    Procedure: SIMPLE MASTECTOMY;  Surgeon: Stark Klein, MD;  Location: [facility];  Service: General;  Laterality: Left;  . Robotic assisted total hysterectomy with bilateral salpingo oopherectomy N/A 11/13/2012    Procedure: ROBOTIC ASSISTED TOTAL HYSTERECTOMY WITH BILATERAL SALPINGO OOPHORECTOMY;  Surgeon: Lovenia Kim, MD;  Location: [facility];  Service: Gynecology;  Laterality: N/A;  . Abdominal hysterectomy      FAMILY HISTORY Family History  Problem Relation Age of Onset  . Cancer Paternal Aunt 53    Breast cancer (half sibling, related through father)  . Spina bifida Paternal Uncle 0    died around 50-49 months of age  . Cancer Maternal Grandmother 28    colon cancer  . Dementia Maternal Grandfather   . Cancer Paternal Grandmother     breast cancer between 38-42  . Cancer Paternal Grandfather     brain cancer  . Cancer Other     Paternal grandmother's siblings had breast cancer and pancreatic cancer  . Cancer Other 21    maternal great grandmother with breast cancer   the patient's parents are alive, in their early 69s. The patient is an only child. There is a significant family history of cancer including breast, brain, and colon, detailed in our genetic counselors report. BRCA and P. 53 testing is negative  GYNECOLOGIC HISTORY:  (Reviewed 02/06/2014) Menarche age 53; she is GX P2, first live birth age 25.  She had not had periods for a long time since she had been breast-feeding, but resumed menstruating July 2013.  She stopped having menstrual cycles during neoadjuvant chemotherapy. She is status post hysterectomy and bilateral salpingo-oophorectomy in March 2014.  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 Her husband Jody Dunn "Ed" Quentin Angst. works as a Educational psychologist for [employer]	[place]. Their children are Jody Dunn (2008) and Jody Dunn (2012).   ADVANCED DIRECTIVES: not in place  HEALTH  MAINTENANCE:  (Reviewed 02/06/2014)  Social History  Substance Use Topics  . Smoking status: Never Smoker   . Smokeless tobacco: Never Used  . Alcohol Use: 2.5 - 3.5 oz/week    5-7 drink(s) per week     Comment: OCCASIONAL     Colonoscopy: never  PAP: 2012/ s/p hysterectomy in March 2014  Bone density: never  Lipid panel:  June 2014, elevated    Allergies  Allergen Reactions  . Zofran [Ondansetron Hcl] Nausea And Vomiting, Other (See Comments) and Rash    Migraine headaches Migraine headaches and vomiting  . Oxycodone Itching and Rash  . Adhesive [Tape] Hives  . Antiseptic Products, Misc.     duraprep-rash  . Clindamycin Hives  . Clindamycin/Lincomycin Hives  . Hydrocodone-Acetaminophen Hives  . Duraprep [employer], Misc.] Hives and Rash  . Penicillins Hives, Nausea And Vomiting and Rash    vomiting    Current Outpatient Prescriptions  Medication Sig Dispense Refill  . fluocinonide-emollient (LIDEX-E) 0.05 % cream Apply 1 application topically 2 (  two) times daily. (Patient not taking: Reported on 05/09/2015) 30 g 0  . ketoconazole (NIZORAL) 2 % cream Apply 1 application topically daily. (Patient not taking: Reported on 05/09/2015) 15 g 0  . lactose hydrous POWD Take by mouth.    . naproxen sodium (ANAPROX) 220 MG tablet Take 220 mg by mouth daily as needed.     . Probiotic Product (PROBIOTIC & ACIDOPHILUS EX ST) CAPS Take 1 capsule by mouth daily.     . prochlorperazine (COMPAZINE) 10 MG tablet Take one tablet three times daily prn for nausea and vomiting. (Patient not taking: Reported on 05/09/2015) 60 tablet 0  . traMADol (ULTRAM) 50 MG tablet Take 1 tablet (50 mg total) by mouth every 6 (six) hours as needed. 20 tablet 0  . venlafaxine XR (EFFEXOR-XR) 37.5 MG 24 hr capsule Take 1 capsule (37.5 mg total) by mouth daily with breakfast. (Patient taking differently: Take 37.5 mg by mouth daily with breakfast. Pt takes 2 tablets daily- total 75 mg) 90 capsule 4   No  current facility-administered medications for this visit.    Objective: Young white woman who appears stated age  40 Vitals:   01/28/16 1208  BP: 143/105  Pulse: 96  Temp: 98.9 F (37.2 C)  Resp: 18  Body mass index is 29.67 kg/(m^2).  ECOG:  1 Filed Weights   01/28/16 1208  Weight: 195 lb 1.6 oz (88.497 kg)   Skin: erythematous rash to right upper extremity HEENT: sclerae anicteric, conjunctivae pink, oropharynx clear. No thrush or mucositis.  Lymph Nodes: No cervical or supraclavicular lymphadenopathy  Lungs: clear to auscultation bilaterally, no rales, wheezes, or rhonci  Heart: regular rate and rhythm  Abdomen: round, soft, non tender, positive bowel sounds  Musculoskeletal: No focal spinal tenderness, grade 1 right upper extremity lymphedema, tender with palpation Neuro: non focal, well oriented, positive affect  Breasts: deferred   LAB RESULTS: Lab Results  Component Value Date   WBC 6.7 05/09/2015   NEUTROABS 4.3 05/09/2015   HGB 14.1 05/09/2015   HCT 41.4 05/09/2015   MCV 92.4 05/09/2015   PLT 277 05/09/2015       Chemistry      Component Value Date/Time   NA 141 05/09/2015 0948   NA 136 05/09/2014 1346   K 4.2 05/09/2015 0948   K 4.0 05/09/2014 1346   CL 102 05/09/2014 1346   CL 105 01/25/2013 0910   CO2 26 05/09/2015 0948   CO2 24 05/09/2014 1346   BUN 13.6 05/09/2015 0948   BUN 14 05/09/2014 1346   CREATININE 1.0 05/09/2015 0948   CREATININE 0.89 05/09/2014 1346      Component Value Date/Time   CALCIUM 9.7 05/09/2015 0948   CALCIUM 9.0 05/09/2014 1346   ALKPHOS 114 05/09/2015 0948   ALKPHOS 65 05/09/2014 1346   AST 53* 05/09/2015 0948   AST 30 05/09/2014 1346   ALT 65* 05/09/2015 0948   ALT 26 05/09/2014 1346   BILITOT 0.27 05/09/2015 0948   BILITOT 0.3 05/09/2014 1346       STUDIES: No results found.   ASSESSMENT: 40 y.o. BRCA-negative [place] woman   (1)  status post right breast biopsy 02/09/2012 for multifocal pT4 cN1,  stage IIIB invasive ductal carcinoma. Prognostic profile from one of the 2 masses shows it to be triple positive with an MIB-1 of 30%.   (2)  s/p 4 dose dense cycles of doxorubicin/ cyclophosphamide, followed by 4 cycles of paclitaxel, dose-dense,completed 06/07/2012, given with trastuzumab   (3)  trastuzumab given every 3 weeks through early June 2014, discontinued due to a drop in the ejection fraction which has recovered. Final echo 03/12/2013.  (4) s/p Right modified radical mastectomy (with prophylactic left simple mastectomy) 06/28/2012, showing a residual 2.3 cm invasive ductal carcinoma, grade 3, with 0 of 14 lymph nodes involved.  (a) Status post bilateral breast reconstruction under the care of Dr. Isa Rankin at [facility]   (5)  Received radiation in [place] under the care of Dr. Berton Mount, beginning January 2014 and completed in late February 2014  (6)  Started tamoxifen March 2014, discontinued in November 2014 due to side effects   (a) started on anastrozole in December 2014 discontinued in January 2015 due to side effects  (b) restarted tamoxifen in February 2015, discontinued December 2015 due to side effects  (c) started letrozole March 2016, discontinued June 2016 due to side effects  (7)  status post total hysterectomy with bilateral salpingo-oophorectomy 11/13/2012.  (8) started venlafaxine, low-dose, in preparation for retrying tamoxifen later this year   (9) cellulitis right upper extremity diagnosed by inspection 04/29/2015  (a)  Amoxicillin/clavulanate and ciprofloxacin started 04/29/2015  (b) recurrent infection 01/28/16. IV vanc x 3 days.   PLAN:  [name] prefers to be treated with IV antibiotics this time around. Dr. Jana Hakim suggests treating with 1.5g vancomycin daily x 3 days. She will begin her first dose this afternoon. If she requires further treatment, we will likely finish out with oral antibiotics. I gave her a prescription for tramadol for her pain.  The  physical therapist met with her this afternoon. After the infection has cleared, the patient will return to using her compression pump and night sleeves for her chronic right lymphedema.   [name] has missed her last 2 follow up appointments with Dr. Jana Hakim to discuss retrying antiestrogen therapy. I am making her a new appointment with him in August. She will be in [place] in July.    Laurie Panda, NP    01/28/2016

## 2016-01-29 ENCOUNTER — Ambulatory Visit (HOSPITAL_BASED_OUTPATIENT_CLINIC_OR_DEPARTMENT_OTHER): Payer: BLUE CROSS/BLUE SHIELD

## 2016-01-29 ENCOUNTER — Telehealth: Payer: Self-pay

## 2016-01-29 ENCOUNTER — Other Ambulatory Visit: Payer: Self-pay | Admitting: Nurse Practitioner

## 2016-01-29 ENCOUNTER — Ambulatory Visit (HOSPITAL_BASED_OUTPATIENT_CLINIC_OR_DEPARTMENT_OTHER): Payer: BLUE CROSS/BLUE SHIELD | Admitting: Nurse Practitioner

## 2016-01-29 ENCOUNTER — Telehealth: Payer: Self-pay | Admitting: Nurse Practitioner

## 2016-01-29 VITALS — BP 119/75 | HR 86 | Resp 18

## 2016-01-29 VITALS — BP 135/84 | HR 84 | Temp 98.6°F | Resp 18 | Ht 68.0 in | Wt 194.8 lb

## 2016-01-29 DIAGNOSIS — C50411 Malignant neoplasm of upper-outer quadrant of right female breast: Secondary | ICD-10-CM

## 2016-01-29 DIAGNOSIS — L03113 Cellulitis of right upper limb: Secondary | ICD-10-CM | POA: Diagnosis not present

## 2016-01-29 DIAGNOSIS — Z853 Personal history of malignant neoplasm of breast: Secondary | ICD-10-CM | POA: Diagnosis not present

## 2016-01-29 MED ORDER — DEXTROSE 5 % IV SOLN
1.0000 g | INTRAVENOUS | Status: DC
  Administered 2016-01-29: 1 g via INTRAVENOUS
  Filled 2016-01-29: qty 10

## 2016-01-29 NOTE — Telephone Encounter (Signed)
Pt called that her chest is red this morning after vancomycin yesterday. Called back and she clarified that yesterday her chest was itching and she took some benadryl. In the night she noted her chest was red and took another benadryl. She denies any SOB or difficulty swallowing. S/w Heather and she said pt is to see [employer] today. Called back and told pt to come in about 1230 for a [facility] visit to decide if this is side effect or allergic reaction, before she continues with her second vancomycin dose today at 1315. Pt agreeable.

## 2016-01-29 NOTE — Patient Instructions (Signed)
Ceftriaxone injection What is this medicine? CEFTRIAXONE (sef try AX one) is a cephalosporin antibiotic. It is used to treat certain kinds of bacterial infections. It will not work for colds, flu, or other viral infections. This medicine may be used for other purposes; ask your health care provider or pharmacist if you have questions. What should I tell my health care provider before I take this medicine? They need to know if you have any of these conditions: -any chronic illness -bowel disease, like colitis -both kidney and liver disease -high bilirubin level in newborn patients -an unusual or allergic reaction to ceftriaxone, other cephalosporin or penicillin antibiotics, foods, dyes, or preservatives -pregnant or trying to get pregnant -breast-feeding How should I use this medicine? This medicine is injected into a muscle or infused it into a vein. It is usually given in a medical office or clinic. If you are to give this medicine you will be taught how to inject it. Follow instructions carefully. Use your doses at regular intervals. Do not take your medicine more often than directed. Do not skip doses or stop your medicine early even if you feel better. Do not stop taking except on your doctor's advice. Talk to your pediatrician regarding the use of this medicine in children. Special care may be needed. Overdosage: If you think you have taken too much of this medicine contact a poison control center or emergency room at once. NOTE: This medicine is only for you. Do not share this medicine with others. What if I miss a dose? If you miss a dose, take it as soon as you can. If it is almost time for your next dose, take only that dose. Do not take double or extra doses. What may interact with this medicine? Do not take this medicine with any of the following medications: -intravenous calcium This medicine may also interact with the following medications: -birth control pills This list may  not describe all possible interactions. Give your health care provider a list of all the medicines, herbs, non-prescription drugs, or dietary supplements you use. Also tell them if you smoke, drink alcohol, or use illegal drugs. Some items may interact with your medicine. What should I watch for while using this medicine? Tell your doctor or health care professional if your symptoms do not improve or if they get worse. Do not treat diarrhea with over the counter products. Contact your doctor if you have diarrhea that lasts more than 2 days or if it is severe and watery. If you are being treated for a sexually transmitted disease, avoid sexual contact until you have finished your treatment. Having sex can infect your sexual partner. Calcium may bind to this medicine and cause lung or kidney problems. Avoid calcium products while taking this medicine and for 48 hours after taking the last dose of this medicine. What side effects may I notice from receiving this medicine? Side effects that you should report to your doctor or health care professional as soon as possible: -allergic reactions like skin rash, itching or hives, swelling of the face, lips, or tongue -breathing problems -fever, chills -irregular heartbeat -pain when passing urine -seizures -stomach pain, cramps -unusual bleeding, bruising -unusually weak or tired Side effects that usually do not require medical attention (report to your doctor or health care professional if they continue or are bothersome): -diarrhea -dizzy, drowsy -headache -nausea, vomiting -pain, swelling, irritation where injected -stomach upset -sweating This list may not describe all possible side effects. Call your doctor for   medical advice about side effects. You may report side effects to FDA at 1-800-FDA-1088. Where should I keep my medicine? Keep out of the reach of children. Store at room temperature below 25 degrees C (77 degrees F). Protect from  light. Throw away any unused vials after the expiration date. NOTE: This sheet is a summary. It may not cover all possible information. If you have questions about this medicine, talk to your doctor, pharmacist, or health care provider.    2016, Elsevier/Gold Standard. (2014-02-25 09:14:54)  

## 2016-01-29 NOTE — Telephone Encounter (Signed)
Added [facility] apt ..12-12:30 is providers lunch .Marland Kitchen Cannot sched.Marland Kitchen Apt sched @ 1:00, pt aware per pof

## 2016-01-30 ENCOUNTER — Ambulatory Visit (HOSPITAL_BASED_OUTPATIENT_CLINIC_OR_DEPARTMENT_OTHER): Payer: BLUE CROSS/BLUE SHIELD

## 2016-01-30 ENCOUNTER — Encounter: Payer: Self-pay | Admitting: Oncology

## 2016-01-30 VITALS — BP 130/92 | HR 99 | Temp 98.3°F | Resp 17

## 2016-01-30 DIAGNOSIS — L03115 Cellulitis of right lower limb: Secondary | ICD-10-CM

## 2016-01-30 DIAGNOSIS — L03113 Cellulitis of right upper limb: Secondary | ICD-10-CM

## 2016-01-30 MED ORDER — DEXTROSE 5 % IV SOLN
1.0000 g | Freq: Once | INTRAVENOUS | Status: AC
  Administered 2016-01-30: 1 g via INTRAVENOUS
  Filled 2016-01-30: qty 10

## 2016-01-30 MED ORDER — SODIUM CHLORIDE 0.9 % IV SOLN
Freq: Once | INTRAVENOUS | Status: AC
  Administered 2016-01-30: 10:00:00 via INTRAVENOUS

## 2016-01-30 NOTE — Patient Instructions (Signed)
Ceftriaxone injection What is this medicine? CEFTRIAXONE (sef try AX one) is a cephalosporin antibiotic. It is used to treat certain kinds of bacterial infections. It will not work for colds, flu, or other viral infections. This medicine may be used for other purposes; ask your health care provider or pharmacist if you have questions. What should I tell my health care provider before I take this medicine? They need to know if you have any of these conditions: -any chronic illness -bowel disease, like colitis -both kidney and liver disease -high bilirubin level in newborn patients -an unusual or allergic reaction to ceftriaxone, other cephalosporin or penicillin antibiotics, foods, dyes, or preservatives -pregnant or trying to get pregnant -breast-feeding How should I use this medicine? This medicine is injected into a muscle or infused it into a vein. It is usually given in a medical office or clinic. If you are to give this medicine you will be taught how to inject it. Follow instructions carefully. Use your doses at regular intervals. Do not take your medicine more often than directed. Do not skip doses or stop your medicine early even if you feel better. Do not stop taking except on your doctor's advice. Talk to your pediatrician regarding the use of this medicine in children. Special care may be needed. Overdosage: If you think you have taken too much of this medicine contact a poison control center or emergency room at once. NOTE: This medicine is only for you. Do not share this medicine with others. What if I miss a dose? If you miss a dose, take it as soon as you can. If it is almost time for your next dose, take only that dose. Do not take double or extra doses. What may interact with this medicine? Do not take this medicine with any of the following medications: -intravenous calcium This medicine may also interact with the following medications: -birth control pills This list may  not describe all possible interactions. Give your health care provider a list of all the medicines, herbs, non-prescription drugs, or dietary supplements you use. Also tell them if you smoke, drink alcohol, or use illegal drugs. Some items may interact with your medicine. What should I watch for while using this medicine? Tell your doctor or health care professional if your symptoms do not improve or if they get worse. Do not treat diarrhea with over the counter products. Contact your doctor if you have diarrhea that lasts more than 2 days or if it is severe and watery. If you are being treated for a sexually transmitted disease, avoid sexual contact until you have finished your treatment. Having sex can infect your sexual partner. Calcium may bind to this medicine and cause lung or kidney problems. Avoid calcium products while taking this medicine and for 48 hours after taking the last dose of this medicine. What side effects may I notice from receiving this medicine? Side effects that you should report to your doctor or health care professional as soon as possible: -allergic reactions like skin rash, itching or hives, swelling of the face, lips, or tongue -breathing problems -fever, chills -irregular heartbeat -pain when passing urine -seizures -stomach pain, cramps -unusual bleeding, bruising -unusually weak or tired Side effects that usually do not require medical attention (report to your doctor or health care professional if they continue or are bothersome): -diarrhea -dizzy, drowsy -headache -nausea, vomiting -pain, swelling, irritation where injected -stomach upset -sweating This list may not describe all possible side effects. Call your doctor for   medical advice about side effects. You may report side effects to FDA at 1-800-FDA-1088. Where should I keep my medicine? Keep out of the reach of children. Store at room temperature below 25 degrees C (77 degrees F). Protect from  light. Throw away any unused vials after the expiration date. NOTE: This sheet is a summary. It may not cover all possible information. If you have questions about this medicine, talk to your doctor, pharmacist, or health care provider.    2016, Elsevier/Gold Standard. (2014-02-25 09:14:54)  

## 2016-01-30 NOTE — Progress Notes (Signed)
Pt came in to inquire about copay assistance for IV antibiotics.  I informed her the foundations we work with will only cover drugs associated w/ a cancer Dx.  She verbalized understanding.

## 2016-02-02 ENCOUNTER — Encounter: Payer: Self-pay | Admitting: Nurse Practitioner

## 2016-02-02 NOTE — Assessment & Plan Note (Signed)
Patient states that she has developed right upper extremity cellulitis within the past 24 hours.  Patient states that she had a tiny hangnail to one of her fingertips; and this has led to the cellulitis.  She denies any recent fevers or chills.  She states that she has had a history of cellulitis in the past with the same arm.  She was seen at the cancer center just yesterday; and initiated with vancomycin IV.  She returned today, round of daily IV antibiotics; but states that she developed some erythema to her chest and her face during yesterday's vancomycin infusion.  She denies any pruritus or other reaction-type symptoms.  Exam today reveals right upper extremity with resolving cellulitis.  Patient continues with lymphedema to the right arm as her baseline.  Reviewed patient's symptoms with pharmacy; in an effort to determine if this was actually vancomycin reaction red man syndrome; versus a true drug allergic reaction.  Decision was made to hold any further vancomycin whatsoever; and to instead give patient Rocephin 1 g IV.  Patient has plans to return to the [facility] tomorrow to receive her third day of antibiotics intravenously.  She will receive Rocephin 1 g again tomorrow as well.  Patient was also prescribed Cipro and Augmentin to take at home orally if needed.  Patient was advised to call/return or go directed to the emergency department over the weekend she develops any worsening symptoms whatsoever.

## 2016-02-02 NOTE — Assessment & Plan Note (Signed)
Patient is status post chemotherapy, radiation, and mastectomy.  She is currently undergoing observation only.  Patient will continue with her IV antibiotic therapy as directed.  She is scheduled to return for follow-up visit on 04/01/2016.

## 2016-02-02 NOTE — Progress Notes (Signed)
SYMPTOM MANAGEMENT CLINIC    Chief Complaint: Right arm cellulitis  HPI:  Jody Dunn 40 y.o. female diagnosed with breast cancer.  Pt is s/p chemo, radiation, and mastectomy.  She is currently undergoing observation only.   Patient states that she has developed right upper extremity cellulitis within the past 24 hours.  Patient states that she had a tiny hangnail to one of her fingertips; and this has led to the cellulitis.  She denies any recent fevers or chills.  She states that she has had a history of cellulitis in the past with the same arm.  She was seen at the cancer center just yesterday; and initiated with vancomycin IV.  She returned today, round of daily IV antibiotics; but states that she developed some erythema to her chest and her face during yesterday's vancomycin infusion.  She denies any pruritus or other reaction-type symptoms.  Exam today reveals right upper extremity with resolving cellulitis.  Patient continues with lymphedema to the right arm as her baseline.  Reviewed patient's symptoms with pharmacy; in an effort to determine if this was actually vancomycin reaction red man syndrome; versus a true drug allergic reaction.  Decision was made to hold any further vancomycin whatsoever; and to instead give patient Rocephin 1 g IV.  Patient has plans to return to the [facility] tomorrow to receive her third day of antibiotics intravenously.  She will receive Rocephin 1 g again tomorrow as well.  Patient was also prescribed Cipro and Augmentin to take at home orally if needed.  Patient was advised to call/return or go directed to the emergency department over the weekend she develops any worsening symptoms whatsoever.    Breast cancer of upper-outer quadrant of right female breast ([place])    Cancer of upper-outer quadrant of female breast, right breast.  [id number] (Resolved)   02/07/2012 Breast US Diffuse skin thickening, nipple retraction;  Right breast pleomorphic  calcs extending 11cm. At 10 o'clock:  irregular hypoechoic mass, 17x72m. At 11 o'clock: irregular hypoechoic mass 113m adjacent hypoechoic lobulated mass 1644m  02/09/2012 Initial Biopsy Right needle core biopsy (10 o'clock): IDC & DCIS.  (11 o'clock): IDC & DCIS, LVI identified. Grade 2-3, similar in both specimens. ER+ (90%), PR+ (90%), HER2/neu+ by CISH (ratio 4.29). Ki-67: 30%.    02/09/2012 Initial Diagnosis Cancer of upper-outer quadrant of female breast, right breast.  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  Right breast 11 o'clock position irregular enhancing mass measuring 2.9 x 3.5 x 2.3 cm. 2 adjacent axillary LNs worrisome for mets.    02/18/2012 Miscellaneous Genetics testing negative. Genes tested include:  BreastPlus panel BRCA1/2, CDH1, STK11, PTEN and TP53.  Pt declined further testing with BreastNext panel.   02/23/2012 Echocardiogram Pre-treatment EF: 60%     02/28/2012 Imaging Staging PET: Multi-focal hypermetabolism (SUV max 8.8) in right breast with hypermetabolic right axillary adenopathy (SUV max 6.3). No other foci of hypermetabolism concerning for malignancy.    02/28/2012 Imaging Staging CT chest: Areas of irregular soft tissue in right breast with overlying skin thickening and right axillary adenopathy (lymph nodes measure up to 1.2 cm). No evidence of intrathoracic mets.    03/01/2012 - 04/12/2012 Chemotherapy Dose-dense Adriamycin, Cytoxan x 4 cycles    04/26/2012 - 06/07/2012 Chemotherapy Dose-dense Taxol/Trastuzimab x 4 cycles.  04/26/2012 - 01/25/2013 Chemotherapy Unable to complete full year of adjuvant Herceptin due to decreased EF 45-50% noted on 02/12/13. Decision to stop further maintenance Herceptin was made. EF recovered to 55%.   06/28/2012 Surgery Right modified radical mastectomy, grade 3 IDC,  2.3 cm, +LVI, associated grade 3 DCIS. Negative margins. 14 total LNs removed, all benign.  Left simple mastectomy: benign breast tissue.    08/30/2012 - 10/20/2012 Radiation Therapy Completed adjuvant RT in [place], under the care of Dr. Noreene Filbert.    10/21/2012 -  Anti-estrogen oral therapy Started Tamoxifen. Stopped in 06/2013 due to side effects.  Started Anastrazole in 07/2013, but was stopped in 08/2013 due to side effects.  Restarted Tamoxifen in 09/2013, stopped in 07/2014 by patient due to side effects.     11/13/2012 Surgery TAH/BSO performed: Unremarkable pathology; no hyperplasia, carcinoma, endometriosis, dysplasia, or malignancy.    03/12/2013 Echocardiogram Post-treatment EF: 55%   11/02/2013 Surgery Plastic Surgery-Dr. Kerrie Pleasure: Revision of left reconstructed breast, B NAC reconstruction, fat grafting to bilateral breasts with 3 donor sites, steroid (Kenalog) injection to RIQ breast scar    01/30/2014 Imaging Restaging CT c/a/p: No evidence of local recurrence of disease or metastatic disease in chest, abdomen, or pelvis. Small focus on right middle rib (thought to be healing fracture).    02/18/2014 Imaging Restaging Bone Scan: Small focus of uptake in right middle rib (healing fracture), small focus of uptake in left knee and right ankle (degenerative changes).     Review of Systems  Skin:       Right arm cellulitis and possible red man syndrome versus allergic reaction to vancomycin.  Chronic history of lymphedema to the right upper extremity.  All other systems reviewed and are negative.   Past Medical History  Diagnosis Date  . Iron (Fe) deficiency anemia   . Asthma     exercise induced  . Back pain   . Migraines   . Lactose intolerance   . Hyperlipidemia   . Allergy   . Anxiety 09/18/2013  . Breast cancer ([place])   . Cancer [facility])     Past Surgical History  Procedure Laterality Date  . Appendectomy    . Shoulder surgery    . Knee surgery      X2  . Portacath  placement  02/21/2012    Procedure: INSERTION PORT-A-CATH;  Surgeon: Stark Klein, MD;  Location: [facility];  Service: General;  Laterality: N/A;  . Skin biopsy  02/21/2012    Procedure: BIOPSY SKIN;  Surgeon: Stark Klein, MD;  Location: [facility];  Service: General;  Laterality: Right;  . Breast biopsy  02/09/2012  . Mastectomy modified radical  06/28/2012    Procedure: MASTECTOMY MODIFIED RADICAL;  Surgeon: Stark Klein, MD;  Location: [facility];  Service: General;  Laterality: Right;  Right Modified Radical Mastectomy and Left Simple Mastectomy  . Simple mastectomy with axillary sentinel node biopsy  06/28/2012    Procedure: SIMPLE MASTECTOMY;  Surgeon: Stark Klein, MD;  Location: [facility];  Service: General;  Laterality: Left;  . Robotic assisted total hysterectomy with bilateral salpingo oopherectomy N/A 11/13/2012    Procedure: ROBOTIC ASSISTED TOTAL HYSTERECTOMY WITH BILATERAL SALPINGO OOPHORECTOMY;  Surgeon: Lovenia Kim, MD;  Location: [facility];  Service: Gynecology;  Laterality: N/A;  . Abdominal hysterectomy      has ALLERGIC RHINITIS, SEASONAL; Iron (Fe) deficiency anemia; Lactose intolerance; Migraines; S/P hysterectomy; Breast cancer of upper-outer quadrant of right female breast ([place]); S/P bilateral salpingo-oophorectomy; Hot flashes due to  surgical menopause; Anxiety; Lymphedema of arm; Incisional hernia, without obstruction or gangrene; Insomnia; and Cellulitis on her problem list.    is allergic to zofran; oxycodone; adhesive; antiseptic products, misc.; clindamycin; clindamycin/lincomycin; hydrocodone-acetaminophen; duraprep; and penicillins.    Medication List       This list is accurate as of: 01/29/16 11:59 PM.  Always use your most recent med list.               amoxicillin-clavulanate 875-125 MG tablet  Commonly known as:  AUGMENTIN  Take 1 tablet by mouth 2 (two) times daily.     ciprofloxacin 500 MG tablet  Commonly known as:  CIPRO  Take 1 tablet (500 mg total) by mouth 2  (two) times daily.     fluocinonide-emollient 0.05 % cream  Commonly known as:  LIDEX-E  Apply 1 application topically 2 (two) times daily.     ketoconazole 2 % cream  Commonly known as:  NIZORAL  Apply 1 application topically daily.     lactose hydrous Powd  Take by mouth.     naproxen sodium 220 MG tablet  Commonly known as:  ANAPROX  Take 220 mg by mouth daily as needed.     PROBIOTIC & ACIDOPHILUS EX ST Caps  Take 1 capsule by mouth daily.     prochlorperazine 10 MG tablet  Commonly known as:  COMPAZINE  Take one tablet three times daily prn for nausea and vomiting.     traMADol 50 MG tablet  Commonly known as:  ULTRAM  Take 1 tablet (50 mg total) by mouth every 6 (six) hours as needed.     venlafaxine XR 37.5 MG 24 hr capsule  Commonly known as:  EFFEXOR-XR  Take 1 capsule (37.5 mg total) by mouth daily with breakfast.         PHYSICAL EXAMINATION  Oncology Vitals 01/30/2016 01/29/2016  Height - -  Weight - -  Weight (lbs) - -  BMI (kg/m2) - -  Temp 98.3 -  Pulse 99 86  Resp 17 18  SpO2 100 100  BSA (m2) - -   BP Readings from Last 2 Encounters:  01/30/16 130/92  01/29/16 119/75    Physical Exam  Constitutional: She is oriented to person, place, and time and well-developed, well-nourished, and in no distress.  HENT:  Head: Normocephalic and atraumatic.  Eyes: Conjunctivae and EOM are normal. Pupils are equal, round, and reactive to light. Right eye exhibits no discharge. Left eye exhibits no discharge. No scleral icterus.  Neck: Normal range of motion.  Pulmonary/Chest: Effort normal. No respiratory distress.  Musculoskeletal: Normal range of motion. She exhibits edema. She exhibits no tenderness.  See skin note.   Neurological: She is alert and oriented to person, place, and time. Gait normal.  Skin: Skin is warm and dry. No rash noted. There is erythema. No pallor.  Exam today reveals right upper extremity with resolving cellulitis.  Patient  continues with lymphedema to the right arm as her baseline.    Patient also has generalized erythema to her entire upper chest but no obvious rash.   Psychiatric: Affect normal.  Nursing note and vitals reviewed.   LABORATORY DATA:. No visits with results within 3 Day(s) from this visit. Latest known visit with results is:  Appointment on 05/09/2015  Component Date Value Ref Range Status  . WBC 05/09/2015 6.7  3.9 - 10.3 10e3/uL Final  . NEUT# 05/09/2015 4.3  1.5 - 6.5 10e3/uL Final  . HGB 05/09/2015 14.1  11.6 - 15.9 g/dL Final  . HCT 05/09/2015 41.4  34.8 - 46.6 % Final  . Platelets 05/09/2015 277  145 - 400 10e3/uL Final  . MCV 05/09/2015 92.4  79.5 - 101.0 fL Final  . MCH 05/09/2015 31.5  25.1 - 34.0 pg Final  . MCHC 05/09/2015 34.1  31.5 - 36.0 g/dL Final  . RBC 05/09/2015 4.48  3.70 - 5.45 10e6/uL Final  . RDW 05/09/2015 12.5  11.2 - 14.5 % Final  . lymph# 05/09/2015 1.7  0.9 - 3.3 10e3/uL Final  . MONO# 05/09/2015 0.6  0.1 - 0.9 10e3/uL Final  . Eosinophils Absolute 05/09/2015 0.0  0.0 - 0.5 10e3/uL Final  . Basophils Absolute 05/09/2015 0.0  0.0 - 0.1 10e3/uL Final  . NEUT% 05/09/2015 64.2  38.4 - 76.8 % Final  . LYMPH% 05/09/2015 25.7  14.0 - 49.7 % Final  . MONO% 05/09/2015 9.6  0.0 - 14.0 % Final  . EOS% 05/09/2015 0.0  0.0 - 7.0 % Final  . BASO% 05/09/2015 0.5  0.0 - 2.0 % Final  . Sodium 05/09/2015 141  136 - 145 mEq/L Final  . Potassium 05/09/2015 4.2  3.5 - 5.1 mEq/L Final  . Chloride 05/09/2015 104  98 - 109 mEq/L Final  . CO2 05/09/2015 26  22 - 29 mEq/L Final  . Glucose 05/09/2015 112  70 - 140 mg/dl Final   Glucose reference range is for nonfasting patients. Fasting glucose reference range is 70- 100.  Marland Kitchen BUN 05/09/2015 13.6  7.0 - 26.0 mg/dL Final  . Creatinine 05/09/2015 1.0  0.6 - 1.1 mg/dL Final  . Total Bilirubin 05/09/2015 0.27  0.20 - 1.20 mg/dL Final  . Alkaline Phosphatase 05/09/2015 114  40 - 150 U/L Final  . AST 05/09/2015 53* 5 - 34 U/L Final    . ALT 05/09/2015 65* 0 - 55 U/L Final  . Total Protein 05/09/2015 7.8  6.4 - 8.3 g/dL Final  . Albumin 05/09/2015 4.2  3.5 - 5.0 g/dL Final  . Calcium 05/09/2015 9.7  8.4 - 10.4 mg/dL Final  . Anion Gap 05/09/2015 10  3 - 11 mEq/L Final  . EGFR 05/09/2015 75* >90 ml/min/1.73 m2 Final   eGFR is calculated using the CKD-EPI Creatinine Equation (2009)    Right arm cellulitis from 01/28/16:    Right arm on 01/29/16:     Chest:       RADIOGRAPHIC STUDIES: No results found.  ASSESSMENT/PLAN:    Cellulitis Patient states that she has developed right upper extremity cellulitis within the past 24 hours.  Patient states that she had a tiny hangnail to one of her fingertips; and this has led to the cellulitis.  She denies any recent fevers or chills.  She states that she has had a history of cellulitis in the past with the same arm.  She was seen at the cancer center just yesterday; and initiated with vancomycin IV.  She returned today, round of daily IV antibiotics; but states that she developed some erythema to her chest and her face during yesterday's vancomycin infusion.  She denies any pruritus or other reaction-type symptoms.  Exam today reveals right upper extremity with resolving cellulitis.  Patient continues with lymphedema to the right arm as her baseline.  Reviewed patient's symptoms with pharmacy; in an effort to determine if this was actually vancomycin reaction red man syndrome; versus a true drug allergic reaction.  Decision was made to hold any further vancomycin whatsoever; and to instead give patient  Rocephin 1 g IV.  Patient has plans to return to the [facility] tomorrow to receive her third day of antibiotics intravenously.  She will receive Rocephin 1 g again tomorrow as well.  Patient was also prescribed Cipro and Augmentin to take at home orally if needed.  Patient was advised to call/return or go directed to the emergency department over the weekend she develops any  worsening symptoms whatsoever.  Breast cancer of upper-outer quadrant of right female breast [facility]) Patient is status post chemotherapy, radiation, and mastectomy.  She is currently undergoing observation only.  Patient will continue with her IV antibiotic therapy as directed.  She is scheduled to return for follow-up visit on 04/01/2016.   Patient stated understanding of all instructions; and was in agreement with this plan of care. The patient knows to call the clinic with any problems, questions or concerns.   Total time spent with patient was 25 minutes;  with greater than 75 percent of that time spent in face to face counseling regarding patient's symptoms,  and coordination of care and follow up.  Disclaimer:This dictation was prepared with Dragon/digital dictation along with [employer]. Any transcriptional errors that result from this process are unintentional.  Drue Second, NP 02/02/2016

## 2016-02-03 ENCOUNTER — Telehealth: Payer: Self-pay | Admitting: *Deleted

## 2016-02-03 ENCOUNTER — Other Ambulatory Visit: Payer: Self-pay | Admitting: Nurse Practitioner

## 2016-02-03 NOTE — Telephone Encounter (Signed)
TC to follow up with patient on cellulitis. Arm is swollen and itchy.  She states she was only suppose to take oral antibiotics but was told not to. Pt is very frustrated with this office.   Finished IV abx 3 days on 6/9. Arm swelling has not resolved pt states she is unable to go to lymph clinic until the infection is cleared. She is certain if she comes in she would be told this is just lymph fluid and there was nothing that can be done. Pt began stating derogatory remarks about the [facility] [place] [facility] and the providers. "That office is just what [facility] has become".  Advised pt I would try to find out how we can resolve this situation and communicate back.  Next scheduled appointment with Dr. Jana Hakim in August. Information brought to Selena Lesser, [place] who will follow up with Dr. Jana Hakim.

## 2016-02-05 ENCOUNTER — Telehealth: Payer: Self-pay | Admitting: *Deleted

## 2016-02-05 ENCOUNTER — Other Ambulatory Visit: Payer: Self-pay | Admitting: Oncology

## 2016-02-05 NOTE — Telephone Encounter (Signed)
This RN attempted to call pt to follow up post cellulitis and antibiotics.  Obtained identified VM- message left requesting a return call to this RN.

## 2016-02-06 ENCOUNTER — Other Ambulatory Visit: Payer: Self-pay | Admitting: Oncology

## 2016-04-01 ENCOUNTER — Encounter: Payer: Self-pay | Admitting: Oncology

## 2016-04-01 ENCOUNTER — Ambulatory Visit: Payer: BLUE CROSS/BLUE SHIELD | Admitting: Oncology

## 2016-05-04 ENCOUNTER — Ambulatory Visit (HOSPITAL_BASED_OUTPATIENT_CLINIC_OR_DEPARTMENT_OTHER): Payer: Managed Care, Other (non HMO) | Admitting: Nurse Practitioner

## 2016-05-04 ENCOUNTER — Telehealth: Payer: Self-pay | Admitting: *Deleted

## 2016-05-04 ENCOUNTER — Other Ambulatory Visit: Payer: Self-pay | Admitting: Nurse Practitioner

## 2016-05-04 VITALS — BP 150/98 | HR 85 | Temp 98.5°F | Resp 17 | Ht 68.0 in | Wt 192.5 lb

## 2016-05-04 DIAGNOSIS — L03113 Cellulitis of right upper limb: Secondary | ICD-10-CM

## 2016-05-04 DIAGNOSIS — I890 Lymphedema, not elsewhere classified: Secondary | ICD-10-CM | POA: Diagnosis not present

## 2016-05-04 DIAGNOSIS — C50411 Malignant neoplasm of upper-outer quadrant of right female breast: Secondary | ICD-10-CM | POA: Diagnosis not present

## 2016-05-04 MED ORDER — SODIUM CHLORIDE 0.9 % IV SOLN
Freq: Once | INTRAVENOUS | Status: AC
  Administered 2016-05-04: 15:00:00 via INTRAVENOUS

## 2016-05-04 MED ORDER — DEXTROSE 5 % IV SOLN
1.0000 g | Freq: Once | INTRAVENOUS | Status: DC
  Filled 2016-05-04: qty 10

## 2016-05-04 NOTE — Patient Instructions (Signed)
Ceftriaxone injection What is this medicine? CEFTRIAXONE (sef try AX one) is a cephalosporin antibiotic. It is used to treat certain kinds of bacterial infections. It will not work for colds, flu, or other viral infections. This medicine may be used for other purposes; ask your health care provider or pharmacist if you have questions. What should I tell my health care provider before I take this medicine? They need to know if you have any of these conditions: -any chronic illness -bowel disease, like colitis -both kidney and liver disease -high bilirubin level in newborn patients -an unusual or allergic reaction to ceftriaxone, other cephalosporin or penicillin antibiotics, foods, dyes, or preservatives -pregnant or trying to get pregnant -breast-feeding How should I use this medicine? This medicine is injected into a muscle or infused it into a vein. It is usually given in a medical office or clinic. If you are to give this medicine you will be taught how to inject it. Follow instructions carefully. Use your doses at regular intervals. Do not take your medicine more often than directed. Do not skip doses or stop your medicine early even if you feel better. Do not stop taking except on your doctor's advice. Talk to your pediatrician regarding the use of this medicine in children. Special care may be needed. Overdosage: If you think you have taken too much of this medicine contact a poison control center or emergency room at once. NOTE: This medicine is only for you. Do not share this medicine with others. What if I miss a dose? If you miss a dose, take it as soon as you can. If it is almost time for your next dose, take only that dose. Do not take double or extra doses. What may interact with this medicine? Do not take this medicine with any of the following medications: -intravenous calcium This medicine may also interact with the following medications: -birth control pills This list may  not describe all possible interactions. Give your health care provider a list of all the medicines, herbs, non-prescription drugs, or dietary supplements you use. Also tell them if you smoke, drink alcohol, or use illegal drugs. Some items may interact with your medicine. What should I watch for while using this medicine? Tell your doctor or health care professional if your symptoms do not improve or if they get worse. Do not treat diarrhea with over the counter products. Contact your doctor if you have diarrhea that lasts more than 2 days or if it is severe and watery. If you are being treated for a sexually transmitted disease, avoid sexual contact until you have finished your treatment. Having sex can infect your sexual partner. Calcium may bind to this medicine and cause lung or kidney problems. Avoid calcium products while taking this medicine and for 48 hours after taking the last dose of this medicine. What side effects may I notice from receiving this medicine? Side effects that you should report to your doctor or health care professional as soon as possible: -allergic reactions like skin rash, itching or hives, swelling of the face, lips, or tongue -breathing problems -fever, chills -irregular heartbeat -pain when passing urine -seizures -stomach pain, cramps -unusual bleeding, bruising -unusually weak or tired Side effects that usually do not require medical attention (report to your doctor or health care professional if they continue or are bothersome): -diarrhea -dizzy, drowsy -headache -nausea, vomiting -pain, swelling, irritation where injected -stomach upset -sweating This list may not describe all possible side effects. Call your doctor for   medical advice about side effects. You may report side effects to FDA at 1-800-FDA-1088. Where should I keep my medicine? Keep out of the reach of children. Store at room temperature below 25 degrees C (77 degrees F). Protect from  light. Throw away any unused vials after the expiration date. NOTE: This sheet is a summary. It may not cover all possible information. If you have questions about this medicine, talk to your doctor, pharmacist, or health care provider.    2016, Elsevier/Gold Standard. (2014-02-25 09:14:54)  

## 2016-05-04 NOTE — Telephone Encounter (Signed)
Pt will be here at 1:00pm to see Cyndee Berniece Salines

## 2016-05-04 NOTE — Telephone Encounter (Signed)
Pt states she has had cellulitis twice in the past in right arm.  Has been moving this week and started to feel bad last evening. Arm sore and feels flushed. Does not have thermometer at home. Is concerned she might be developing cellulitis again.

## 2016-05-05 ENCOUNTER — Encounter: Payer: Self-pay | Admitting: Nurse Practitioner

## 2016-05-05 ENCOUNTER — Ambulatory Visit (HOSPITAL_BASED_OUTPATIENT_CLINIC_OR_DEPARTMENT_OTHER): Payer: Managed Care, Other (non HMO) | Admitting: Nurse Practitioner

## 2016-05-05 DIAGNOSIS — L03113 Cellulitis of right upper limb: Secondary | ICD-10-CM | POA: Diagnosis not present

## 2016-05-05 DIAGNOSIS — C50411 Malignant neoplasm of upper-outer quadrant of right female breast: Secondary | ICD-10-CM

## 2016-05-05 MED ORDER — SODIUM CHLORIDE 0.9 % IV SOLN
INTRAVENOUS | Status: DC
  Administered 2016-05-05: 13:00:00 via INTRAVENOUS

## 2016-05-05 MED ORDER — DEXTROSE 5 % IV SOLN
1.0000 g | Freq: Once | INTRAVENOUS | Status: AC
  Administered 2016-05-05: 1 g via INTRAVENOUS
  Filled 2016-05-05: qty 1

## 2016-05-05 MED ORDER — DEXTROSE 5 % IV SOLN
1.0000 g | Freq: Once | INTRAVENOUS | Status: AC
  Administered 2016-05-05: 1 g via INTRAVENOUS
  Filled 2016-05-05: qty 10

## 2016-05-05 NOTE — Progress Notes (Signed)
SYMPTOM MANAGEMENT CLINIC    Chief Complaint: Cellulitis  HPI:  Jody Dunn 40 y.o. female diagnosed with breast cancer.  Patient is status post chemotherapy, radiation treatments; is currently undergoing observation only.     Breast cancer of upper-outer quadrant of right female breast ([place])    Cancer of upper-outer quadrant of female breast, right breast.  [id number] (Resolved)   02/07/2012 Breast US    Diffuse skin thickening, nipple retraction;  Right breast pleomorphic calcs extending 11cm. At 10 o'clock:  irregular hypoechoic mass, 17x56m. At 11 o'clock: irregular hypoechoic mass 167m adjacent hypoechoic lobulated mass 1640m     02/09/2012 Initial Biopsy    Right needle core biopsy (10 o'clock): IDC & DCIS.  (11 o'clock): IDC & DCIS, LVI identified. Grade 2-3, similar in both specimens. ER+ (90%), PR+ (90%), HER2/neu+ by CISH (ratio 4.29). Ki-67: 30%.       02/09/2012 Initial Diagnosis    Cancer of upper-outer quadrant of female breast, right breast.  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  Right breast 11 o'clock position irregular enhancing mass measuring 2.9 x 3.5 x 2.3 cm. 2 adjacent axillary LNs worrisome for mets.       02/18/2012 Miscellaneous    Genetics testing negative. Genes tested include:  BreastPlus panel BRCA1/2, CDH1, STK11, PTEN and TP53.  Pt declined further testing with BreastNext panel.      02/23/2012 Echocardiogram    Pre-treatment EF: 60%        02/28/2012 Imaging    Staging PET: Multi-focal hypermetabolism (SUV max 8.8) in right breast with hypermetabolic right axillary adenopathy (SUV max 6.3). No other foci of hypermetabolism concerning for malignancy.       02/28/2012 Imaging    Staging CT chest: Areas of irregular soft tissue in right breast with  overlying skin thickening and right axillary adenopathy (lymph nodes measure up to 1.2 cm). No evidence of intrathoracic mets.       03/01/2012 - 04/12/2012 Chemotherapy    Dose-dense Adriamycin, Cytoxan x 4 cycles       04/26/2012 - 06/07/2012 Chemotherapy    Dose-dense Taxol/Trastuzimab x 4 cycles.       04/26/2012 - 01/25/2013 Chemotherapy    Unable to complete full year of adjuvant Herceptin due to decreased EF 45-50% noted on 02/12/13. Decision to stop further maintenance Herceptin was made. EF recovered to 55%.      06/28/2012 Surgery    Right modified radical mastectomy, grade 3 IDC, 2.3 cm, +LVI, associated grade 3 DCIS. Negative margins. 14 total LNs removed, all benign.  Left simple mastectomy: benign breast tissue.       08/30/2012 - 10/20/2012 Radiation Therapy    [place] GleNoreene Filbert     10/21/2012 -  Anti-estrogen oral therapy    Started Tamoxifen. Stopped in 06/2013 due to side effects.  Started Anastrazole in 07/2013, but was stopped in 08/2013 due to side effects.  Restarted Tamoxifen in 09/2013, stopped in 07/2014 by patient due to side effects.        11/13/2012 Surgery    TAH/BSO performed: Unremarkable pathology; no hyperplasia, carcinoma, endometriosis, dysplasia, or malignancy.       03/12/2013 Echocardiogram  Post-treatment EF: 55%      11/02/2013 Surgery    Plastic Surgery-Dr. Kerrie Pleasure: Revision of left reconstructed breast, B NAC reconstruction, fat grafting to bilateral breasts with 3 donor sites, steroid (Kenalog) injection to RIQ breast scar       01/30/2014 Imaging    Restaging CT c/a/p: No evidence of local recurrence of disease or metastatic disease in chest, abdomen, or pelvis. Small focus on right middle rib (thought to be healing fracture).       02/18/2014 Imaging    Restaging Bone Scan: Small focus of uptake in right middle rib (healing fracture), small focus of uptake in left knee and right ankle  (degenerative changes).        Review of Systems  Skin:       Mild erythema to the back.  Patient's right upper arm.  All other systems reviewed and are negative.   Past Medical History:  Diagnosis Date  . Allergy   . Anxiety 09/18/2013  . Asthma    exercise induced  . Back pain   . Breast cancer ([place])   . Cancer ([place])   . Hyperlipidemia   . Iron (Fe) deficiency anemia   . Lactose intolerance   . Migraines     Past Surgical History:  Procedure Laterality Date  . ABDOMINAL HYSTERECTOMY    . APPENDECTOMY    . BREAST BIOPSY  02/09/2012  . KNEE SURGERY     X2  . MASTECTOMY MODIFIED RADICAL  06/28/2012   Procedure: MASTECTOMY MODIFIED RADICAL;  Surgeon: Stark Klein, MD;  Location: [facility];  Service: General;  Laterality: Right;  Right Modified Radical Mastectomy and Left Simple Mastectomy  . PORTACATH PLACEMENT  02/21/2012   Procedure: INSERTION PORT-A-CATH;  Surgeon: Stark Klein, MD;  Location: [facility];  Service: General;  Laterality: N/A;  . ROBOTIC ASSISTED TOTAL HYSTERECTOMY WITH BILATERAL SALPINGO OOPHERECTOMY N/A 11/13/2012   Procedure: ROBOTIC ASSISTED TOTAL HYSTERECTOMY WITH BILATERAL SALPINGO OOPHORECTOMY;  Surgeon: Lovenia Kim, MD;  Location: [facility];  Service: Gynecology;  Laterality: N/A;  . SHOULDER SURGERY    . SIMPLE MASTECTOMY WITH AXILLARY SENTINEL NODE BIOPSY  06/28/2012   Procedure: SIMPLE MASTECTOMY;  Surgeon: Stark Klein, MD;  Location: [facility];  Service: General;  Laterality: Left;  . SKIN BIOPSY  02/21/2012   Procedure: BIOPSY SKIN;  Surgeon: Stark Klein, MD;  Location: [facility];  Service: General;  Laterality: Right;    has ALLERGIC RHINITIS, SEASONAL; Iron (Fe) deficiency anemia; Lactose intolerance; Migraines; S/P hysterectomy; Breast cancer of upper-outer quadrant of right female breast ([place]); S/P bilateral salpingo-oophorectomy; Hot flashes due to surgical menopause; Anxiety; Lymphedema of arm; Incisional hernia, without obstruction or gangrene;  Insomnia; and Cellulitis on her problem list.    is allergic to zofran [ondansetron hcl]; oxycodone; adhesive [tape]; antiseptic products, misc.; clindamycin; clindamycin/lincomycin; hydrocodone-acetaminophen; duraprep [antiseptic products, misc.]; and penicillins.    Medication List       Accurate as of 05/04/16 11:59 PM. Always use your most recent med list.          fluocinonide-emollient 0.05 % cream Commonly known as:  LIDEX-E Apply 1 application topically 2 (two) times daily.   ketoconazole 2 % cream Commonly known as:  NIZORAL Apply 1 application topically daily.   lactose hydrous Powd Take by mouth.   naproxen sodium 220 MG tablet Commonly known as:  ANAPROX Take 220 mg by mouth daily as needed.   PROBIOTIC & ACIDOPHILUS EX ST Caps Take 1 capsule by mouth daily.  prochlorperazine 10 MG tablet Commonly known as:  COMPAZINE Take one tablet three times daily prn for nausea and vomiting.   traMADol 50 MG tablet Commonly known as:  ULTRAM Take 1 tablet (50 mg total) by mouth every 6 (six) hours as needed.   venlafaxine XR 37.5 MG 24 hr capsule Commonly known as:  EFFEXOR-XR Take 1 capsule (37.5 mg total) by mouth daily with breakfast.        PHYSICAL EXAMINATION  Oncology Vitals 05/04/2016 01/30/2016  Height 173 cm -  Weight 87.317 kg -  Weight (lbs) 192 lbs 8 oz -  BMI (kg/m2) 29.27 kg/m2 -  Temp 98.5 98.3  Pulse 85 99  Resp 17 17  Resp (Historical as of 03/23/12) - -  SpO2 100 100  BSA (m2) 2.05 m2 -   BP Readings from Last 2 Encounters:  05/04/16 (!) 150/98  01/30/16 (!) 130/92    Physical Exam  Constitutional: She is oriented to person, place, and time and well-developed, well-nourished, and in no distress.  HENT:  Head: Normocephalic and atraumatic.  Eyes: Pupils are equal, round, and reactive to light.  Neck: Normal range of motion.  Pulmonary/Chest: Effort normal. No respiratory distress.  Musculoskeletal: Normal range of motion. She  exhibits edema. She exhibits no tenderness.  Neurological: She is alert and oriented to person, place, and time. Gait normal.  Skin: Skin is warm and dry. No rash noted. There is erythema. No pallor.  Patient has a very mild erythema to the back of patient's right upper arm.  There is no warmth, no tenderness, intermittent streaks.  Also, patient has chronic lymphedema to the right upper extremity as well.  Psychiatric: Affect normal.  Nursing note and vitals reviewed.   LABORATORY DATA:. No visits with results within 3 Day(s) from this visit.  Latest known visit with results is:  Appointment on 05/09/2015  Component Date Value Ref Range Status  . WBC 05/09/2015 6.7  3.9 - 10.3 10e3/uL Final  . NEUT# 05/09/2015 4.3  1.5 - 6.5 10e3/uL Final  . HGB 05/09/2015 14.1  11.6 - 15.9 g/dL Final  . HCT 05/09/2015 41.4  34.8 - 46.6 % Final  . Platelets 05/09/2015 277  145 - 400 10e3/uL Final  . MCV 05/09/2015 92.4  79.5 - 101.0 fL Final  . MCH 05/09/2015 31.5  25.1 - 34.0 pg Final  . MCHC 05/09/2015 34.1  31.5 - 36.0 g/dL Final  . RBC 05/09/2015 4.48  3.70 - 5.45 10e6/uL Final  . RDW 05/09/2015 12.5  11.2 - 14.5 % Final  . lymph# 05/09/2015 1.7  0.9 - 3.3 10e3/uL Final  . MONO# 05/09/2015 0.6  0.1 - 0.9 10e3/uL Final  . Eosinophils Absolute 05/09/2015 0.0  0.0 - 0.5 10e3/uL Final  . Basophils Absolute 05/09/2015 0.0  0.0 - 0.1 10e3/uL Final  . NEUT% 05/09/2015 64.2  38.4 - 76.8 % Final  . LYMPH% 05/09/2015 25.7  14.0 - 49.7 % Final  . MONO% 05/09/2015 9.6  0.0 - 14.0 % Final  . EOS% 05/09/2015 0.0  0.0 - 7.0 % Final  . BASO% 05/09/2015 0.5  0.0 - 2.0 % Final  . Sodium 05/09/2015 141  136 - 145 mEq/L Final  . Potassium 05/09/2015 4.2  3.5 - 5.1 mEq/L Final  . Chloride 05/09/2015 104  98 - 109 mEq/L Final  . CO2 05/09/2015 26  22 - 29 mEq/L Final  . Glucose 05/09/2015 112  70 - 140 mg/dl Final  . BUN 05/09/2015 13.6  7.0 - 26.0 mg/dL Final  . Creatinine 05/09/2015 1.0  0.6 - 1.1 mg/dL Final    . Total Bilirubin 05/09/2015 0.27  0.20 - 1.20 mg/dL Final  . Alkaline Phosphatase 05/09/2015 114  40 - 150 U/L Final  . AST 05/09/2015 53* 5 - 34 U/L Final  . ALT 05/09/2015 65* 0 - 55 U/L Final  . Total Protein 05/09/2015 7.8  6.4 - 8.3 g/dL Final  . Albumin 05/09/2015 4.2  3.5 - 5.0 g/dL Final  . Calcium 05/09/2015 9.7  8.4 - 10.4 mg/dL Final  . Anion Gap 05/09/2015 10  3 - 11 mEq/L Final  . EGFR 05/09/2015 75* >90 ml/min/1.73 m2 Final    RADIOGRAPHIC STUDIES: No results found.  ASSESSMENT/PLAN:    Cellulitis Patient presented to the [facility] today with a 24-hour history of mild erythema and tenderness to her right upper extremity.  She also states that she may possibly have had a low-grade fever earlier this morning as well;  fact, and is feeling more fatigued this morning.  Patient has a history of chronic lymphedema; has also had multiple episodes of cellulitis to the right upper extremity as well.  In the past.  Patient states that she typically requires IV antibiotics to clear of the chronic cellulitis to her right upper extremity.  Exam today reveals trace erythema only to the back of the right upper extremity.  There is no warmth or tenderness to the site.  Reviewed all findings with Dr. Jana Hakim; and he recommended that patient proceed with Rocephin 1 g IV daily for a total of 3 days.  Patient will receive the first dose of Rocephin IV today.  She will return both tomorrow 05/05/2016 and 05/06/2016 for additional Rocephin.  Of note,-.  Patient was also the Rocephin as an intramuscular injection and refused.  Patient was advised to call/return or go directly to the emergency department for any worsening symptoms whatsoever.    Breast cancer of upper-outer quadrant of right female breast [facility]) Patient is status post chemotherapy, radiation, and mastectomy.  She is currently undergoing observation only.  Patient states she has met with the [facility] physician  regarding the possibility of a lymph node transplant; and is happy to report that she was told she was a candidate for transplant.  She plans a follow-up visit with the [facility] [place] within the next few months.  Patient will continue with her IV antibiotic therapy as directed.    Last progress note stated that patient was to see Dr. Jana Hakim in August of; but there is a visit note in the chart.  Will review all with Dr. Jana Hakim and confirm if and when patient will need to be seen again here at the cancer center.   Patient stated understanding of all instructions; and was in agreement with this plan of care. The patient knows to call the clinic with any problems, questions or concerns.   Total time spent with patient was 25 minutes;  with greater than 75 percent of that time spent in face to face counseling regarding patient's symptoms,  and coordination of care and follow up.  Disclaimer:This dictation was prepared with Dragon/digital dictation along with [employer]. Any transcriptional errors that result from this process are unintentional.  Drue Second, NP 05/05/2016

## 2016-05-05 NOTE — Patient Instructions (Signed)
Ceftriaxone injection What is this medicine? CEFTRIAXONE (sef try AX one) is a cephalosporin antibiotic. It is used to treat certain kinds of bacterial infections. It will not work for colds, flu, or other viral infections. This medicine may be used for other purposes; ask your health care provider or pharmacist if you have questions. What should I tell my health care provider before I take this medicine? They need to know if you have any of these conditions: -any chronic illness -bowel disease, like colitis -both kidney and liver disease -high bilirubin level in newborn patients -an unusual or allergic reaction to ceftriaxone, other cephalosporin or penicillin antibiotics, foods, dyes, or preservatives -pregnant or trying to get pregnant -breast-feeding How should I use this medicine? This medicine is injected into a muscle or infused it into a vein. It is usually given in a medical office or clinic. If you are to give this medicine you will be taught how to inject it. Follow instructions carefully. Use your doses at regular intervals. Do not take your medicine more often than directed. Do not skip doses or stop your medicine early even if you feel better. Do not stop taking except on your doctor's advice. Talk to your pediatrician regarding the use of this medicine in children. Special care may be needed. Overdosage: If you think you have taken too much of this medicine contact a poison control center or emergency room at once. NOTE: This medicine is only for you. Do not share this medicine with others. What if I miss a dose? If you miss a dose, take it as soon as you can. If it is almost time for your next dose, take only that dose. Do not take double or extra doses. What may interact with this medicine? Do not take this medicine with any of the following medications: -intravenous calcium This medicine may also interact with the following medications: -birth control pills This list may  not describe all possible interactions. Give your health care provider a list of all the medicines, herbs, non-prescription drugs, or dietary supplements you use. Also tell them if you smoke, drink alcohol, or use illegal drugs. Some items may interact with your medicine. What should I watch for while using this medicine? Tell your doctor or health care professional if your symptoms do not improve or if they get worse. Do not treat diarrhea with over the counter products. Contact your doctor if you have diarrhea that lasts more than 2 days or if it is severe and watery. If you are being treated for a sexually transmitted disease, avoid sexual contact until you have finished your treatment. Having sex can infect your sexual partner. Calcium may bind to this medicine and cause lung or kidney problems. Avoid calcium products while taking this medicine and for 48 hours after taking the last dose of this medicine. What side effects may I notice from receiving this medicine? Side effects that you should report to your doctor or health care professional as soon as possible: -allergic reactions like skin rash, itching or hives, swelling of the face, lips, or tongue -breathing problems -fever, chills -irregular heartbeat -pain when passing urine -seizures -stomach pain, cramps -unusual bleeding, bruising -unusually weak or tired Side effects that usually do not require medical attention (report to your doctor or health care professional if they continue or are bothersome): -diarrhea -dizzy, drowsy -headache -nausea, vomiting -pain, swelling, irritation where injected -stomach upset -sweating This list may not describe all possible side effects. Call your doctor for   medical advice about side effects. You may report side effects to FDA at 1-800-FDA-1088. Where should I keep my medicine? Keep out of the reach of children. Store at room temperature below 25 degrees C (77 degrees F). Protect from  light. Throw away any unused vials after the expiration date. NOTE: This sheet is a summary. It may not cover all possible information. If you have questions about this medicine, talk to your doctor, pharmacist, or health care provider.    2016, Elsevier/Gold Standard. (2014-02-25 09:14:54)   RETURN APPT 05/06/16 @ 12:30 PM WITH CYNDEE BACON, NP IN [facility] AND 3RD DOSE OF IV ROCEPHIN.

## 2016-05-05 NOTE — Assessment & Plan Note (Signed)
Patient is status post chemotherapy, radiation, and mastectomy.  She is currently undergoing observation only.  Patient states she has met with the [facility] physician regarding the possibility of a lymph node transplant; and is happy to report that she was told she was a candidate for transplant.  She plans a follow-up visit with the [facility] within the next few months.  Patient will continue with her IV antibiotic therapy as directed.    Last progress note stated that patient was to see Dr. Jana Hakim in August of; but there is a visit note in the chart.  Will review all with Dr. Jana Hakim and confirm if and when patient will need to be seen again here at the cancer center.

## 2016-05-05 NOTE — Assessment & Plan Note (Signed)
Patient presented to the [facility] today with a 24-hour history of mild erythema and tenderness to her right upper extremity.  She also states that she may possibly have had a low-grade fever earlier this morning as well;  fact, and is feeling more fatigued this morning.  Patient has a history of chronic lymphedema; has also had multiple episodes of cellulitis to the right upper extremity as well.  In the past.  Patient states that she typically requires IV antibiotics to clear of the chronic cellulitis to her right upper extremity.  Exam today reveals trace erythema only to the back of the right upper extremity.  There is no warmth or tenderness to the site.  Reviewed all findings with Dr. Jana Hakim; and he recommended that patient proceed with Rocephin 1 g IV daily for a total of 3 days.  Patient will receive the first dose of Rocephin IV today.  She will return both tomorrow 05/05/2016 and 05/06/2016 for additional Rocephin.  Of note,-.  Patient was also the Rocephin as an intramuscular injection and refused.  Patient was advised to call/return or go directly to the emergency department for any worsening symptoms whatsoever.

## 2016-05-06 ENCOUNTER — Ambulatory Visit (HOSPITAL_BASED_OUTPATIENT_CLINIC_OR_DEPARTMENT_OTHER): Payer: Managed Care, Other (non HMO) | Admitting: Nurse Practitioner

## 2016-05-06 ENCOUNTER — Encounter: Payer: Self-pay | Admitting: Nurse Practitioner

## 2016-05-06 DIAGNOSIS — L03113 Cellulitis of right upper limb: Secondary | ICD-10-CM | POA: Diagnosis not present

## 2016-05-06 DIAGNOSIS — C50411 Malignant neoplasm of upper-outer quadrant of right female breast: Secondary | ICD-10-CM

## 2016-05-06 MED ORDER — DEXTROSE 5 % IV SOLN
1.0000 g | INTRAVENOUS | Status: DC
  Filled 2016-05-06: qty 10

## 2016-05-06 MED ORDER — SODIUM CHLORIDE 0.9 % IV SOLN
Freq: Once | INTRAVENOUS | Status: AC
  Administered 2016-05-06: 13:00:00 via INTRAVENOUS

## 2016-05-06 MED ORDER — DEXTROSE 5 % IV SOLN
1.0000 g | Freq: Once | INTRAVENOUS | Status: AC
  Administered 2016-05-06: 1 g via INTRAVENOUS
  Filled 2016-05-06: qty 10

## 2016-05-06 MED ORDER — DEXTROSE 5 % IV SOLN
1.0000 g | Freq: Once | INTRAVENOUS | Status: DC
  Administered 2016-05-06: 1 g via INTRAVENOUS
  Filled 2016-05-06: qty 10

## 2016-05-06 NOTE — Patient Instructions (Signed)
Ceftriaxone injection What is this medicine? CEFTRIAXONE (sef try AX one) is a cephalosporin antibiotic. It is used to treat certain kinds of bacterial infections. It will not work for colds, flu, or other viral infections. This medicine may be used for other purposes; ask your health care provider or pharmacist if you have questions. What should I tell my health care provider before I take this medicine? They need to know if you have any of these conditions: -any chronic illness -bowel disease, like colitis -both kidney and liver disease -high bilirubin level in newborn patients -an unusual or allergic reaction to ceftriaxone, other cephalosporin or penicillin antibiotics, foods, dyes, or preservatives -pregnant or trying to get pregnant -breast-feeding How should I use this medicine? This medicine is injected into a muscle or infused it into a vein. It is usually given in a medical office or clinic. If you are to give this medicine you will be taught how to inject it. Follow instructions carefully. Use your doses at regular intervals. Do not take your medicine more often than directed. Do not skip doses or stop your medicine early even if you feel better. Do not stop taking except on your doctor's advice. Talk to your pediatrician regarding the use of this medicine in children. Special care may be needed. Overdosage: If you think you have taken too much of this medicine contact a poison control center or emergency room at once. NOTE: This medicine is only for you. Do not share this medicine with others. What if I miss a dose? If you miss a dose, take it as soon as you can. If it is almost time for your next dose, take only that dose. Do not take double or extra doses. What may interact with this medicine? Do not take this medicine with any of the following medications: -intravenous calcium This medicine may also interact with the following medications: -birth control pills This list may  not describe all possible interactions. Give your health care provider a list of all the medicines, herbs, non-prescription drugs, or dietary supplements you use. Also tell them if you smoke, drink alcohol, or use illegal drugs. Some items may interact with your medicine. What should I watch for while using this medicine? Tell your doctor or health care professional if your symptoms do not improve or if they get worse. Do not treat diarrhea with over the counter products. Contact your doctor if you have diarrhea that lasts more than 2 days or if it is severe and watery. If you are being treated for a sexually transmitted disease, avoid sexual contact until you have finished your treatment. Having sex can infect your sexual partner. Calcium may bind to this medicine and cause lung or kidney problems. Avoid calcium products while taking this medicine and for 48 hours after taking the last dose of this medicine. What side effects may I notice from receiving this medicine? Side effects that you should report to your doctor or health care professional as soon as possible: -allergic reactions like skin rash, itching or hives, swelling of the face, lips, or tongue -breathing problems -fever, chills -irregular heartbeat -pain when passing urine -seizures -stomach pain, cramps -unusual bleeding, bruising -unusually weak or tired Side effects that usually do not require medical attention (report to your doctor or health care professional if they continue or are bothersome): -diarrhea -dizzy, drowsy -headache -nausea, vomiting -pain, swelling, irritation where injected -stomach upset -sweating This list may not describe all possible side effects. Call your doctor for   medical advice about side effects. You may report side effects to FDA at 1-800-FDA-1088. Where should I keep my medicine? Keep out of the reach of children. Store at room temperature below 25 degrees C (77 degrees F). Protect from  light. Throw away any unused vials after the expiration date. NOTE: This sheet is a summary. It may not cover all possible information. If you have questions about this medicine, talk to your doctor, pharmacist, or health care provider.    2016, Elsevier/Gold Standard. (2014-02-25 09:14:54)  

## 2016-05-06 NOTE — Assessment & Plan Note (Signed)
Patient is status post chemotherapy, radiation, and mastectomy.  She is currently undergoing observation only.  Patient states she has met with the [facility] physician regarding the possibility of a lymph node transplant; and is happy to report that she was told she was a candidate for transplant.  She plans a follow-up visit with the [facility] within the next few months.  Last progress note stated that patient was to see Dr. Jana Hakim in August of; but there is a visit note in the chart.  Will review all with Dr. Jana Hakim and confirm if and when patient will need to be seen again here at the cancer center.  Update: Patient has been arranged for labs and a visit with Dr. Jana Hakim on 05/27/2016.

## 2016-05-06 NOTE — Progress Notes (Signed)
SYMPTOM MANAGEMENT CLINIC    Chief Complaint: Cellulitis  HPI:  Jody Dunn 40 y.o. female diagnosed with breast cancer.  Patient is status post chemotherapy, radiation treatments; is currently undergoing observation only.     Breast cancer of upper-outer quadrant of right female breast ([place])    Cancer of upper-outer quadrant of female breast, right breast.  [id number] (Resolved)   02/07/2012 Breast US    Diffuse skin thickening, nipple retraction;  Right breast pleomorphic calcs extending 11cm. At 10 o'clock:  irregular hypoechoic mass, 17x46m. At 11 o'clock: irregular hypoechoic mass 174m adjacent hypoechoic lobulated mass 1678m     02/09/2012 Initial Biopsy    Right needle core biopsy (10 o'clock): IDC & DCIS.  (11 o'clock): IDC & DCIS, LVI identified. Grade 2-3, similar in both specimens. ER+ (90%), PR+ (90%), HER2/neu+ by CISH (ratio 4.29). Ki-67: 30%.       02/09/2012 Initial Diagnosis    Cancer of upper-outer quadrant of female breast, right breast.  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  Right breast 11 o'clock position irregular enhancing mass measuring 2.9 x 3.5 x 2.3 cm. 2 adjacent axillary LNs worrisome for mets.       02/18/2012 Miscellaneous    Genetics testing negative. Genes tested include:  BreastPlus panel BRCA1/2, CDH1, STK11, PTEN and TP53.  Pt declined further testing with BreastNext panel.      02/23/2012 Echocardiogram    Pre-treatment EF: 60%        02/28/2012 Imaging    Staging PET: Multi-focal hypermetabolism (SUV max 8.8) in right breast with hypermetabolic right axillary adenopathy (SUV max 6.3). No other foci of hypermetabolism concerning for malignancy.       02/28/2012 Imaging    Staging CT chest: Areas of irregular soft tissue in right breast with  overlying skin thickening and right axillary adenopathy (lymph nodes measure up to 1.2 cm). No evidence of intrathoracic mets.       03/01/2012 - 04/12/2012 Chemotherapy    Dose-dense Adriamycin, Cytoxan x 4 cycles       04/26/2012 - 06/07/2012 Chemotherapy    Dose-dense Taxol/Trastuzimab x 4 cycles.       04/26/2012 - 01/25/2013 Chemotherapy    Unable to complete full year of adjuvant Herceptin due to decreased EF 45-50% noted on 02/12/13. Decision to stop further maintenance Herceptin was made. EF recovered to 55%.      06/28/2012 Surgery    Right modified radical mastectomy, grade 3 IDC, 2.3 cm, +LVI, associated grade 3 DCIS. Negative margins. 14 total LNs removed, all benign.  Left simple mastectomy: benign breast tissue.       08/30/2012 - 10/20/2012 Radiation Therapy    [place] GleNoreene Filbert     10/21/2012 -  Anti-estrogen oral therapy    Started Tamoxifen. Stopped in 06/2013 due to side effects.  Started Anastrazole in 07/2013, but was stopped in 08/2013 due to side effects.  Restarted Tamoxifen in 09/2013, stopped in 07/2014 by patient due to side effects.        11/13/2012 Surgery    TAH/BSO performed: Unremarkable pathology; no hyperplasia, carcinoma, endometriosis, dysplasia, or malignancy.       03/12/2013 Echocardiogram  Post-treatment EF: 55%      11/02/2013 Surgery    Plastic Surgery-Dr. Kerrie Pleasure: Revision of left reconstructed breast, B NAC reconstruction, fat grafting to bilateral breasts with 3 donor sites, steroid (Kenalog) injection to RIQ breast scar       01/30/2014 Imaging    Restaging CT c/a/p: No evidence of local recurrence of disease or metastatic disease in chest, abdomen, or pelvis. Small focus on right middle rib (thought to be healing fracture).       02/18/2014 Imaging    Restaging Bone Scan: Small focus of uptake in right middle rib (healing fracture), small focus of uptake in left knee and right ankle  (degenerative changes).        Review of Systems  Skin:       All cellulitis has completely resolved to patient's right upper arm  All other systems reviewed and are negative.   Past Medical History:  Diagnosis Date  . Allergy   . Anxiety 09/18/2013  . Asthma    exercise induced  . Back pain   . Breast cancer ([facility])   . Cancer ([place])   . Hyperlipidemia   . Iron (Fe) deficiency anemia   . Lactose intolerance   . Migraines     Past Surgical History:  Procedure Laterality Date  . ABDOMINAL HYSTERECTOMY    . APPENDECTOMY    . BREAST BIOPSY  02/09/2012  . KNEE SURGERY     X2  . MASTECTOMY MODIFIED RADICAL  06/28/2012   Procedure: MASTECTOMY MODIFIED RADICAL;  Surgeon: Stark Klein, MD;  Location: [facility];  Service: General;  Laterality: Right;  Right Modified Radical Mastectomy and Left Simple Mastectomy  . PORTACATH PLACEMENT  02/21/2012   Procedure: INSERTION PORT-A-CATH;  Surgeon: Stark Klein, MD;  Location: [facility];  Service: General;  Laterality: N/A;  . ROBOTIC ASSISTED TOTAL HYSTERECTOMY WITH BILATERAL SALPINGO OOPHERECTOMY N/A 11/13/2012   Procedure: ROBOTIC ASSISTED TOTAL HYSTERECTOMY WITH BILATERAL SALPINGO OOPHORECTOMY;  Surgeon: Lovenia Kim, MD;  Location: [facility];  Service: Gynecology;  Laterality: N/A;  . SHOULDER SURGERY    . SIMPLE MASTECTOMY WITH AXILLARY SENTINEL NODE BIOPSY  06/28/2012   Procedure: SIMPLE MASTECTOMY;  Surgeon: Stark Klein, MD;  Location: [facility];  Service: General;  Laterality: Left;  . SKIN BIOPSY  02/21/2012   Procedure: BIOPSY SKIN;  Surgeon: Stark Klein, MD;  Location: [facility];  Service: General;  Laterality: Right;    has ALLERGIC RHINITIS, SEASONAL; Iron (Fe) deficiency anemia; Lactose intolerance; Migraines; S/P hysterectomy; Breast cancer of upper-outer quadrant of right female breast ([place]); S/P bilateral salpingo-oophorectomy; Hot flashes due to surgical menopause; Anxiety; Lymphedema of arm; Incisional hernia, without obstruction or  gangrene; Insomnia; and Cellulitis on her problem list.    is allergic to zofran [ondansetron hcl]; oxycodone; adhesive [tape]; antiseptic products, misc.; clindamycin; clindamycin/lincomycin; hydrocodone-acetaminophen; duraprep [antiseptic products, misc.]; and penicillins.    Medication List       Accurate as of 05/06/16  6:24 PM. Always use your most recent med list.          fluocinonide-emollient 0.05 % cream Commonly known as:  LIDEX-E Apply 1 application topically 2 (two) times daily.   ketoconazole 2 % cream Commonly known as:  NIZORAL Apply 1 application topically daily.   lactose hydrous Powd Take by mouth.   naproxen sodium 220 MG tablet Commonly known as:  ANAPROX Take 220 mg by mouth daily as needed.   PROBIOTIC & ACIDOPHILUS EX ST Caps Take 1 capsule by mouth daily.  prochlorperazine 10 MG tablet Commonly known as:  COMPAZINE Take one tablet three times daily prn for nausea and vomiting.   traMADol 50 MG tablet Commonly known as:  ULTRAM Take 1 tablet (50 mg total) by mouth every 6 (six) hours as needed.   venlafaxine XR 37.5 MG 24 hr capsule Commonly known as:  EFFEXOR-XR Take 1 capsule (37.5 mg total) by mouth daily with breakfast.        PHYSICAL EXAMINATION  Oncology Vitals 05/06/2016 05/05/2016  Height 173 cm -  Weight 87.181 kg -  Weight (lbs) 192 lbs 3 oz -  BMI (kg/m2) 29.22 kg/m2 -  Temp 98.3 98.8  Pulse 78 98  Resp 16 16  Resp (Historical as of 03/23/12) - -  SpO2 100 100  BSA (m2) 2.05 m2 -   BP Readings from Last 2 Encounters:  05/06/16 131/84  05/05/16 (!) 145/88    Physical Exam  Constitutional: She is oriented to person, place, and time and well-developed, well-nourished, and in no distress.  HENT:  Head: Normocephalic and atraumatic.  Eyes: Pupils are equal, round, and reactive to light.  Neck: Normal range of motion.  Pulmonary/Chest: Effort normal. No respiratory distress.  Musculoskeletal: Normal range of motion. She  exhibits edema. She exhibits no tenderness.  Neurological: She is alert and oriented to person, place, and time. Gait normal.  Skin: Skin is warm and dry. No rash noted. There is erythema. No pallor.  All erythema/cellulitis to patient's right upper extremity has completely resolved.  Also, patient has chronic lymphedema to the right upper extremity as well.  Psychiatric: Affect normal.  Nursing note and vitals reviewed.   LABORATORY DATA:. No visits with results within 3 Day(s) from this visit.  Latest known visit with results is:  Appointment on 05/09/2015  Component Date Value Ref Range Status  . WBC 05/09/2015 6.7  3.9 - 10.3 10e3/uL Final  . NEUT# 05/09/2015 4.3  1.5 - 6.5 10e3/uL Final  . HGB 05/09/2015 14.1  11.6 - 15.9 g/dL Final  . HCT 05/09/2015 41.4  34.8 - 46.6 % Final  . Platelets 05/09/2015 277  145 - 400 10e3/uL Final  . MCV 05/09/2015 92.4  79.5 - 101.0 fL Final  . MCH 05/09/2015 31.5  25.1 - 34.0 pg Final  . MCHC 05/09/2015 34.1  31.5 - 36.0 g/dL Final  . RBC 05/09/2015 4.48  3.70 - 5.45 10e6/uL Final  . RDW 05/09/2015 12.5  11.2 - 14.5 % Final  . lymph# 05/09/2015 1.7  0.9 - 3.3 10e3/uL Final  . MONO# 05/09/2015 0.6  0.1 - 0.9 10e3/uL Final  . Eosinophils Absolute 05/09/2015 0.0  0.0 - 0.5 10e3/uL Final  . Basophils Absolute 05/09/2015 0.0  0.0 - 0.1 10e3/uL Final  . NEUT% 05/09/2015 64.2  38.4 - 76.8 % Final  . LYMPH% 05/09/2015 25.7  14.0 - 49.7 % Final  . MONO% 05/09/2015 9.6  0.0 - 14.0 % Final  . EOS% 05/09/2015 0.0  0.0 - 7.0 % Final  . BASO% 05/09/2015 0.5  0.0 - 2.0 % Final  . Sodium 05/09/2015 141  136 - 145 mEq/L Final  . Potassium 05/09/2015 4.2  3.5 - 5.1 mEq/L Final  . Chloride 05/09/2015 104  98 - 109 mEq/L Final  . CO2 05/09/2015 26  22 - 29 mEq/L Final  . Glucose 05/09/2015 112  70 - 140 mg/dl Final  . BUN 05/09/2015 13.6  7.0 - 26.0 mg/dL Final  . Creatinine 05/09/2015 1.0  0.6 - 1.1  mg/dL Final  . Total Bilirubin 05/09/2015 0.27  0.20 - 1.20  mg/dL Final  . Alkaline Phosphatase 05/09/2015 114  40 - 150 U/L Final  . AST 05/09/2015 53* 5 - 34 U/L Final  . ALT 05/09/2015 65* 0 - 55 U/L Final  . Total Protein 05/09/2015 7.8  6.4 - 8.3 g/dL Final  . Albumin 05/09/2015 4.2  3.5 - 5.0 g/dL Final  . Calcium 05/09/2015 9.7  8.4 - 10.4 mg/dL Final  . Anion Gap 05/09/2015 10  3 - 11 mEq/L Final  . EGFR 05/09/2015 75* >90 ml/min/1.73 m2 Final    RADIOGRAPHIC STUDIES: No results found.  ASSESSMENT/PLAN:    Breast cancer of upper-outer quadrant of right female breast [facility]) Patient is status post chemotherapy, radiation, and mastectomy.  She is currently undergoing observation only.  Patient states she has met with the [facility] physician regarding the possibility of a lymph node transplant; and is happy to report that she was told she was a candidate for transplant.  She plans a follow-up visit with the [facility] within the next few months.  Last progress note stated that patient was to see Dr. Jana Hakim in August of; but there is a visit note in the chart.  Will review all with Dr. Jana Hakim and confirm if and when patient will need to be seen again here at the cancer center.  Update: Patient has been arranged for labs and a visit with Dr. Jana Hakim on 05/27/2016.  Cellulitis Patient presented to the [facility] today with a 24-hour history of mild erythema and tenderness to her right upper extremity.  She also states that she may possibly have had a low-grade fever earlier this morning as well;  fact, and is feeling more fatigued this morning.  Patient has a history of chronic lymphedema; has also had multiple episodes of cellulitis to the right upper extremity as well.  In the past.  Patient states that she typically requires IV antibiotics to clear of the chronic cellulitis to her right upper extremity.  Exam today reveals trace erythema only to the back of the right upper extremity.  There is no warmth or tenderness to the  site.  Reviewed all findings with Dr. Jana Hakim; and he recommended that patient proceed with Rocephin 1 g IV daily for a total of 3 days.  Patient will receive the first dose of Rocephin IV today.  She will return both tomorrow 05/05/2016 and 05/06/2016 for additional Rocephin.  Of note,-.  Patient was also the Rocephin as an intramuscular injection and refused.  Patient was advised to call/return or go directly to the emergency department for any worsening symptoms whatsoever.    Update:Patient presented to the [facility] today to receive day #3 of her Rocephin IV antibiotics.  She states she is feeling back to her baseline.  She denies any recent fevers or chills.  Exam today revealed no evidence of cellulitis to the right or extremity whatsoever.  Patient was advised to call/return.  She has any other worries or concerns whatsoever.   Patient stated understanding of all instructions; and was in agreement with this plan of care. The patient knows to call the clinic with any problems, questions or concerns.   Total time spent with patient was 15 minutes;  with greater than 75 percent of that time spent in face to face counseling regarding patient's symptoms,  and coordination of care and follow up.  Disclaimer:This dictation was prepared with Dragon/digital dictation along with [employer].  Any transcriptional errors that result from this process are unintentional.  Drue Second, NP 05/06/2016

## 2016-05-06 NOTE — Assessment & Plan Note (Signed)
Patient presented to the [facility] today with a 24-hour history of mild erythema and tenderness to her right upper extremity.  She also states that she may possibly have had a low-grade fever earlier this morning as well;  fact, and is feeling more fatigued this morning.  Patient has a history of chronic lymphedema; has also had multiple episodes of cellulitis to the right upper extremity as well.  In the past.  Patient states that she typically requires IV antibiotics to clear of the chronic cellulitis to her right upper extremity.  Exam today reveals trace erythema only to the back of the right upper extremity.  There is no warmth or tenderness to the site.  Reviewed all findings with Dr. Jana Hakim; and he recommended that patient proceed with Rocephin 1 g IV daily for a total of 3 days.  Patient will receive the first dose of Rocephin IV today.  She will return both tomorrow 05/05/2016 and 05/06/2016 for additional Rocephin.  Of note,-.  Patient was also the Rocephin as an intramuscular injection and refused.  Patient was advised to call/return or go directly to the emergency department for any worsening symptoms whatsoever.    Update:Patient presented to the [facility] today to receive day #3 of her Rocephin IV antibiotics.  She states she is feeling back to her baseline.  She denies any recent fevers or chills.  Exam today revealed no evidence of cellulitis to the right or extremity whatsoever.  Patient was advised to call/return.  She has any other worries or concerns whatsoever.

## 2016-05-13 ENCOUNTER — Encounter: Payer: Self-pay | Admitting: Oncology

## 2016-05-17 ENCOUNTER — Telehealth: Payer: Self-pay | Admitting: Nurse Practitioner

## 2016-05-17 ENCOUNTER — Telehealth: Payer: Self-pay | Admitting: Oncology

## 2016-05-17 ENCOUNTER — Telehealth: Payer: Self-pay | Admitting: *Deleted

## 2016-05-17 NOTE — Telephone Encounter (Signed)
lvm to inform pt of r/s appt to 10/6 per LOS

## 2016-05-17 NOTE — Telephone Encounter (Signed)
Patient was transferred to this provider's desk number.  She wanted to inform this provider that she was upset regarding the conversation she just had with one of the [place].  This provider actively listened to the patient's complaints; and assured her that these concerns would be communicated with the department supervisor for further follow-up.  Patient was in agreement with this plan of action.  This provider apologized for the situation; and advised the patient that she should be receiving a phone call back for further follow-up shortly.

## 2016-05-17 NOTE — Telephone Encounter (Signed)
This RN received a transferred message  from [place] per pt - stating need to speak with Jody Dunn per appointment with Dr Jannifer Rodney for 10/5.  Per message pt states she needs am appointments only.  This RN sent URGENT inbox message to reschedule to an am time and date

## 2016-05-19 ENCOUNTER — Other Ambulatory Visit: Payer: Managed Care, Other (non HMO)

## 2016-05-27 ENCOUNTER — Ambulatory Visit: Payer: Managed Care, Other (non HMO) | Admitting: Oncology

## 2016-05-27 ENCOUNTER — Other Ambulatory Visit (HOSPITAL_BASED_OUTPATIENT_CLINIC_OR_DEPARTMENT_OTHER): Payer: Managed Care, Other (non HMO)

## 2016-05-27 ENCOUNTER — Ambulatory Visit (HOSPITAL_BASED_OUTPATIENT_CLINIC_OR_DEPARTMENT_OTHER): Payer: Self-pay | Admitting: Oncology

## 2016-05-27 ENCOUNTER — Other Ambulatory Visit: Payer: Managed Care, Other (non HMO)

## 2016-05-27 VITALS — BP 149/89 | HR 88 | Temp 98.7°F | Resp 18 | Ht 68.0 in | Wt 191.3 lb

## 2016-05-27 DIAGNOSIS — C50411 Malignant neoplasm of upper-outer quadrant of right female breast: Secondary | ICD-10-CM

## 2016-05-27 DIAGNOSIS — Z170 Estrogen receptor positive status [ER+]: Secondary | ICD-10-CM

## 2016-05-27 LAB — COMPREHENSIVE METABOLIC PANEL
ALT: 45 U/L (ref 0–55)
AST: 36 U/L — ABNORMAL HIGH (ref 5–34)
Albumin: 4 g/dL (ref 3.5–5.0)
Alkaline Phosphatase: 111 U/L (ref 40–150)
Anion Gap: 11 mEq/L (ref 3–11)
BUN: 12.6 mg/dL (ref 7.0–26.0)
CALCIUM: 9.3 mg/dL (ref 8.4–10.4)
CHLORIDE: 103 meq/L (ref 98–109)
CO2: 25 meq/L (ref 22–29)
Creatinine: 1 mg/dL (ref 0.6–1.1)
EGFR: 71 mL/min/{1.73_m2} — ABNORMAL LOW (ref >90)
Glucose: 119 mg/dl (ref 70–140)
POTASSIUM: 3.3 meq/L — AB (ref 3.5–5.1)
Sodium: 139 mEq/L (ref 136–145)
Total Bilirubin: 0.47 mg/dL (ref 0.20–1.20)
Total Protein: 7.9 g/dL (ref 6.4–8.3)

## 2016-05-27 LAB — CBC WITH DIFFERENTIAL/PLATELET
BASO%: 0.7 % (ref 0.0–2.0)
Basophils Absolute: 0 10*3/uL (ref 0.0–0.1)
EOS ABS: 0.1 10*3/uL (ref 0.0–0.5)
EOS%: 1.8 % (ref 0.0–7.0)
HCT: 40.8 % (ref 34.8–46.6)
HGB: 13.7 g/dL (ref 11.6–15.9)
LYMPH%: 30.2 % (ref 14.0–49.7)
MCH: 31.5 pg (ref 25.1–34.0)
MCHC: 33.5 g/dL (ref 31.5–36.0)
MCV: 94 fL (ref 79.5–101.0)
MONO#: 0.6 10*3/uL (ref 0.1–0.9)
MONO%: 8.7 % (ref 0.0–14.0)
NEUT#: 4 10*3/uL (ref 1.5–6.5)
NEUT%: 58.6 % (ref 38.4–76.8)
PLATELETS: 271 10*3/uL (ref 145–400)
RBC: 4.35 10*6/uL (ref 3.70–5.45)
RDW: 12.7 % (ref 11.2–14.5)
WBC: 6.7 10*3/uL (ref 3.9–10.3)
lymph#: 2 10*3/uL (ref 0.9–3.3)

## 2016-05-27 NOTE — Progress Notes (Signed)
ID: Jody Dunn   DOB: 1975-09-28  MR#: [id number]  BVA#:[id number]  PCP: Artelia Laroche, CNM GYN: Brien Few, MD  SU: Coralie Keens, MD OTHER MD: Berton Mount, MD;   Stevphen Rochester, MD [facility]);  Stark Klein, MD  CHIEF COMPLAINT:  Hx of Right Breast Cancer  CURRENT TREATMENT: Observation  BREAST CANCER HISTORY: From the original intake note:  Jody Dunn had a mammogram at [facility] in May of 2010 because of shooting pains in the right breast. This found no worrisome finding. In the spring of 2012, the patient at delivered her second child. She nursed but the child refused to take milk from the right breast. The patient tells me that she has been able to pump easily from the right breast, so production is not an issue.   In early June 2013, the patient felt a new lump in the right breast, and brought this to the attention of Artelia Laroche at [place]. The patient had repeat mammography at [facility] 02/07/2012. This found at the breast tissue to be heterogeneously dense, which is not a surprise given that the patient is lactating. There was diffuse skin thickening. Erythema is not described. The nipple was retracted. There was no definitive peau d'orange appearance. Pleomorphic calcifications in the lateral right breast extended approximately 11 cm, without definite mass. On ultrasound there was a vague irregular hypoechoic mass measuring approximately 15 mm in the right breast, with a second ill-defined hypoechoic lobulated mass measuring approximately 17 mm.   Both these masses were biopsied 02/09/2012, and the final pathology ([id number]) showed both IIb invasive ductal carcinoma, with a prognostic panel (from the mass at 11:00) as follows: estrogen receptor positive at 90%, progesterone receptor positive at 90%, Mib-1 of 30%, and HER-2 amplification with a ratio of 4.29 by CISH. MRI of the breast was performed 02/15/2012 and showed the mass in the 10:00 position to measure 3.1 cm, the one at  the 11:00 position to measure 3.1 cm. There was diffuse skin thickening in the right breast and 2 enhancing abnormal-appearing lower right axillary lymph nodes measuring 2.0 and 1.8 cm respectively. There was no evidence for internal mammary adenopathy, left axillary adenopathy, or significant findings in the left breast.  Subsequent treatment is as detailed below.  INTERVAL HISTORY: Jody Dunn returns today for follow up of her estrogen receptor positive breast cancer and recurrent right upper extremity cellulitis. She is very upset because there was some confusion on the time when she was scheduled and on top of that I was late seeing her. She said that she had no idea why she was here except that she was told she had to be here because of her cellulitis, but that is okay now..  She had been scheduled to see me 08/05/2015 and 04/01/2016 and idid not show to either of those appointments.  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 She has no primary care physician though she does see a midwife, Ms Mel Almond. She tells me she is scheduled for lymph transplant surgery at [facility] in the near future. A detailed review of systems today was otherwise "fine."  PAST MEDICAL HISTORY: Past Medical History:  Diagnosis Date  . Allergy   . Anxiety 09/18/2013  . Asthma    exercise induced  . Back pain   . Breast cancer ([place])   . Cancer ([place])   . Hyperlipidemia   . Iron (Fe) deficiency anemia   .  Lactose intolerance   . Migraines     PAST SURGICAL HISTORY: Past Surgical History:  Procedure Laterality Date  . ABDOMINAL HYSTERECTOMY    . APPENDECTOMY    . BREAST BIOPSY  02/09/2012  . KNEE SURGERY     X2  . MASTECTOMY MODIFIED RADICAL  06/28/2012   Procedure: MASTECTOMY MODIFIED RADICAL;  Surgeon: Stark Klein, MD;  Location: [facility];  Service: General;  Laterality: Right;  Right Modified Radical Mastectomy and Left Simple Mastectomy  .  PORTACATH PLACEMENT  02/21/2012   Procedure: INSERTION PORT-A-CATH;  Surgeon: Stark Klein, MD;  Location: [facility];  Service: General;  Laterality: N/A;  . ROBOTIC ASSISTED TOTAL HYSTERECTOMY WITH BILATERAL SALPINGO OOPHERECTOMY N/A 11/13/2012   Procedure: ROBOTIC ASSISTED TOTAL HYSTERECTOMY WITH BILATERAL SALPINGO OOPHORECTOMY;  Surgeon: Lovenia Kim, MD;  Location: [facility];  Service: Gynecology;  Laterality: N/A;  . SHOULDER SURGERY    . SIMPLE MASTECTOMY WITH AXILLARY SENTINEL NODE BIOPSY  06/28/2012   Procedure: SIMPLE MASTECTOMY;  Surgeon: Stark Klein, MD;  Location: [facility];  Service: General;  Laterality: Left;  . SKIN BIOPSY  02/21/2012   Procedure: BIOPSY SKIN;  Surgeon: Stark Klein, MD;  Location: [facility];  Service: General;  Laterality: Right;    FAMILY HISTORY Family History  Problem Relation Age of Onset  . Cancer Paternal Aunt 3    Breast cancer (half sibling, related through father)  . Spina bifida Paternal Uncle 0    died around 49-25 months of age  . Cancer Maternal Grandmother 53    colon cancer  . Dementia Maternal Grandfather   . Cancer Paternal Grandmother     breast cancer between 35-42  . Cancer Paternal Grandfather     brain cancer  . Cancer Other     Paternal grandmother's siblings had breast cancer and pancreatic cancer  . Cancer Other 75    maternal great grandmother with breast cancer   the patient's parents are alive, in their early 29s. The patient is an only child. There is a significant family history of cancer including breast, brain, and colon, detailed in our genetic counselors report. BRCA and P. 53 testing is negative  GYNECOLOGIC HISTORY:  (Reviewed 02/06/2014) Menarche age 78; she is GX P2, first live birth age 12.  She had not had periods for a long time since she had been breast-feeding, but resumed menstruating July 2013.  She stopped having menstrual cycles during neoadjuvant chemotherapy. She is status post hysterectomy and bilateral  salpingo-oophorectomy in March 2014.  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 Her husband Elbin Percell Miller "Ed" Quentin Angst. works as a Educational psychologist for [employer]	[place]. Their children are Jody Dunn (2008) and [name] (2012).   ADVANCED DIRECTIVES: not in place  HEALTH MAINTENANCE:  (Reviewed 02/06/2014)  Social History  Substance Use Topics  . Smoking status: Never Smoker  . Smokeless tobacco: Never Used  . Alcohol use 2.5 - 3.5 oz/week    5 - 7 drink(s) per week     Comment: OCCASIONAL     Colonoscopy: never  PAP: 2012/ s/p hysterectomy in March 2014  Bone density: never  Lipid panel:  June 2014, elevated    Allergies  Allergen Reactions  . Zofran [Ondansetron Hcl] Nausea And Vomiting, Other (See Comments) and Rash    Migraine headaches Migraine headaches and vomiting  . Oxycodone Itching and Rash  . Adhesive [Tape] Hives  . Antiseptic Products, Misc.  duraprep-rash  . Clindamycin Hives  . Clindamycin/Lincomycin Hives  . Hydrocodone-Acetaminophen Hives  . Duraprep [employer], Misc.] Hives and Rash  . Penicillins Hives, Nausea And Vomiting and Rash    vomiting    Current Outpatient Prescriptions  Medication Sig Dispense Refill  . lactose hydrous POWD Take by mouth.    . naproxen sodium (ANAPROX) 220 MG tablet Take 220 mg by mouth daily as needed.     . Probiotic Product (PROBIOTIC & ACIDOPHILUS EX ST) CAPS Take 1 capsule by mouth daily.      No current facility-administered medications for this visit.     Objective: Young white woman Who appears stated age  40:   05/27/16 1524  BP: (!) 149/89  Pulse: 88  Resp: 18  Temp: 98.7 F (37.1 C)  Body mass index is 29.09 kg/m.  ECOG:  1 Filed Weights   05/27/16 1524  Weight: 191 lb 4.8 oz (86.8 kg)   Sclerae unicteric, pupils round and equal No cervical or supraclavicular adenopathy Lungs no rales or rhonchi Heart regular rate and  rhythm Abd soft, nontender, positive bowel sounds MSK the right upper extremity is not swollen or erythematous. Neuro: nonfocal, well oriented, angry affect Breasts: Deferred   LAB RESULTS: Lab Results  Component Value Date   WBC 6.7 05/27/2016   NEUTROABS 4.0 05/27/2016   HGB 13.7 05/27/2016   HCT 40.8 05/27/2016   MCV 94.0 05/27/2016   PLT 271 05/27/2016       Chemistry      Component Value Date/Time   NA 139 05/27/2016 1449   K 3.3 (L) 05/27/2016 1449   CL 102 05/09/2014 1346   CL 105 01/25/2013 0910   CO2 25 05/27/2016 1449   BUN 12.6 05/27/2016 1449   CREATININE 1.0 05/27/2016 1449      Component Value Date/Time   CALCIUM 9.3 05/27/2016 1449   ALKPHOS 111 05/27/2016 1449   AST 36 (H) 05/27/2016 1449   ALT 45 05/27/2016 1449   BILITOT 0.47 05/27/2016 1449       STUDIES: No results found.   ASSESSMENT: 40 y.o. BRCA-negative  woman   (1)  status post right breast biopsy 02/09/2012 for multifocal pT4 cN1, stage IIIB invasive ductal carcinoma. Prognostic profile from one of the 2 masses shows it to be triple positive with an MIB-1 of 30%.   (2)  s/p 4 dose dense cycles of doxorubicin/ cyclophosphamide, followed by 4 cycles of paclitaxel, dose-dense,completed 06/07/2012, given with trastuzumab   (3) trastuzumab given every 3 weeks through early June 2014, discontinued due to a drop in the ejection fraction which has recovered. Final echo 03/12/2013.  (4) s/p Right modified radical mastectomy (with prophylactic left simple mastectomy) 06/28/2012, showing a residual 2.3 cm invasive ductal carcinoma, grade 3, with 0 of 14 lymph nodes involved.  (a) Status post bilateral breast reconstruction under the care of Dr. Isa Rankin at [facility]   (5)  Received radiation in [place] under the care of Dr. Berton Mount, beginning January 2014 and completed in late February 2014  (6)  Started tamoxifen March 2014, discontinued in November 2014 due to side effects   (a)  started on anastrozole in December 2014 discontinued in January 2015 due to side effects  (b) restarted tamoxifen in February 2015, discontinued December 2015 due to side effects  (c) started letrozole March 2016, discontinued June 2016 due to side effects  (7)  status post total hysterectomy with bilateral salpingo-oophorectomy 11/13/2012.  (8)  supposed to have started  venlafaxine, low-dose, in preparation for retrying tamoxifen 2017; but not on venlafaxine at present   (9) cellulitis right upper extremity diagnosed by inspection 04/29/2015  (a)  Amoxicillin/clavulanate and ciprofloxacin started 04/29/2015  (b) recurrent infection 01/28/16. IV vanc x 3 days with resolution  (c) recurrent infection 05/06/2016, s/p cefuroxime IV 3 with resolution   PLAN:  Jody Dunn was upset today as best as I understand it because she had to take time off from work for an afternoon appointment (last minute attempts at finding and morning appointment for her fell through) and because I was late to see her. I apologized for the inconvenience and canceled any billing for this visit.(Our Chief Strategy Officer will make sure neither the patient nor her insurance receives a bill, and Jody Dunn understands if she does receive a bill she should not pay it but contact us).  Quite aside from this issue, Charliegh has been unhappy with her care here. There is a record of no-shows and multiple complaints for a variety of reasons.  I am concerned about this patient who is at high risk for recurrence and has not been able to manage anti-estrogen therapy. She does not have a primary care physician but identifies a midwife as her primary care provider..  We discussed her using the compression sleeve in the right arm more frequently, but she finds it is uncomfortable and "it doesn't help". We discussed more expensive sleeves and she says she has 2 "lymphediva" sleeves which were expensive and are useless. She does have the Reid sleeve pump  which she uses overnight at least sometimes.  I suggested referral to ID for consideration of suppressive therapy or other options given her repeated episodes of cellulitis, but she tells me she has an appointment at [facility] to have surgical intervention for this problem (I do find in care everywhere she will be seeing Dr. Gevena Barre on 07/01/2016).  At this time it is not clear to me that Jody Dunn wants to have further follow-up here. I am writing her a letter inviting her to see me later this year but also offering her some alternatives. She will let us know how she wishes to proceed.    Chauncey Cruel, MD    05/28/2016

## 2016-05-28 ENCOUNTER — Other Ambulatory Visit: Payer: Managed Care, Other (non HMO)

## 2016-05-28 ENCOUNTER — Ambulatory Visit: Payer: Managed Care, Other (non HMO) | Admitting: Oncology

## 2016-05-29 ENCOUNTER — Encounter: Payer: Self-pay | Admitting: Oncology

## 2016-09-23 ENCOUNTER — Ambulatory Visit (INDEPENDENT_AMBULATORY_CARE_PROVIDER_SITE_OTHER): Payer: Managed Care, Other (non HMO) | Admitting: Infectious Diseases

## 2016-09-23 ENCOUNTER — Encounter: Payer: Self-pay | Admitting: Infectious Diseases

## 2016-09-23 DIAGNOSIS — L03113 Cellulitis of right upper limb: Secondary | ICD-10-CM

## 2016-09-23 MED ORDER — LEVOFLOXACIN 500 MG PO TABS: 500.0000 mg | ORAL_TABLET | Freq: Every day | ORAL | 2 refills | Status: DC

## 2016-09-23 NOTE — Progress Notes (Signed)
   Subjective:    Patient ID: Jody Dunn, female    DOB: 07-Mar-1976, 41 y.o.   MRN: [id number]  HPI 41 yo F with hx of breast CA bilateral mastectomies, 4 rounds CTX, xrt, tissue reconstruction. [facility] at [facility] then at [facility]. She also had TAHBSO pro phylactically.  She has since had episodes of recurrent cellulitis. R arm.  She finished her last round of anbx on 08-30-16 (was given IV ceftriaxone x1) then po anbx cipro for 10 days.  She has prev gotten courses of IV anbx (ceftriaxone). 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  By 1-26 she had worsening erythema, she took cipro that she had at home. She has been on this since. She saw her GP on 1-29 and got shot of ceftriaxone IM. This has helped her improve as well.   Has home lymphedema pump.    The past medical history, family history and social history were reviewed/updated in EPIC   Review of Systems  Constitutional: Negative for appetite change, chills, fever and unexpected weight change.  Gastrointestinal: Negative for constipation and diarrhea.  Genitourinary: Negative for difficulty urinating and menstrual problem.   Allergies removed- clinda was when she had reconstruction, was on very high dose.  Her PEN allergy was childhood. She has taken similar drugs (amox, keflex) without problem.     Objective:   Physical Exam  Constitutional: She appears well-developed and well-nourished.  HENT:  Mouth/Throat: No oropharyngeal exudate.  Eyes: EOM are normal. Pupils are equal, round, and reactive to light.  Neck: Neck supple.  Cardiovascular: Normal rate and regular rhythm.   Pulmonary/Chest: Effort normal and breath sounds normal.    Abdominal: Soft. Bowel sounds are normal. There is no tenderness. There is no rebound.  Musculoskeletal: She exhibits no edema.  Lymphadenopathy:    She has no cervical adenopathy.      Assessment & Plan:

## 2016-09-23 NOTE — Assessment & Plan Note (Signed)
Recurrent cellulitis of RUE after breast reconstruction surgery.   She appears to be doing well.  We spoke at length regarding next steps. I will write her a prn course of levaquin She will consider prophylactic anbx- low dose PEN or clinda.  Will see her back in 1-2 months.

## 2016-11-29 ENCOUNTER — Ambulatory Visit: Payer: Managed Care, Other (non HMO) | Admitting: Infectious Diseases

## 2018-07-04 ENCOUNTER — Encounter: Payer: Self-pay | Admitting: Infectious Diseases

## 2018-07-04 ENCOUNTER — Ambulatory Visit (INDEPENDENT_AMBULATORY_CARE_PROVIDER_SITE_OTHER): Payer: 59 | Admitting: Infectious Diseases

## 2018-07-04 DIAGNOSIS — L03113 Cellulitis of right upper limb: Secondary | ICD-10-CM

## 2018-07-04 MED ORDER — LEVOFLOXACIN 500 MG PO TABS
500.0000 mg | ORAL_TABLET | Freq: Every day | ORAL | 2 refills | Status: DC
Start: 2018-07-04 — End: 2019-05-16

## 2018-07-04 MED ORDER — PENICILLIN V POTASSIUM 500 MG PO TABS: 500.0000 mg | ORAL_TABLET | Freq: Every day | ORAL | 4 refills | Status: DC

## 2018-07-04 NOTE — Progress Notes (Signed)
   Subjective:    Patient ID: Jody Dunn, female    DOB: 29-Nov-1975, 42 y.o.   MRN: [id number]  HPI 42 yo F with hx of breast CA bilateral mastectomies, 4 rounds CTX, xrt, tissue reconstruction. Followed at [facility] then at [facility] She also had TAHBSO pro phylactically.  She has since had episodes of recurrent cellulitis. R arm.  She finished her last round of anbx on 08-30-16 (was given IV ceftriaxone x1) then po anbx cipro for 10 days.  She has prev gotten courses of IV anbx (ceftriaxone). 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  By 1-26 she had worsening erythema, she took cipro that she had at home. She has been on this since. She saw her GP on 1-29 and got shot of ceftriaxone IM. This has helped her improve as well.   She was seen in ID on 09-23-16 and was given prn levaquin and was to consider prophylactic PEN.   She is seen today as a walk in.  She broke her R foot "salsa dancing" last week. Her son is going to see ortho tomorrow for nail removal. Husband having sinus surgery next week.   Last pm her R arm became erythema and spots Wrist to upper arm. She took a benadryl last pm but did not improve. She noted heaviness and pain as well.  Temp at home "99 something".   Review of Systems  Constitutional: Negative for chills and fever.  Gastrointestinal: Negative for constipation and diarrhea.  Genitourinary: Negative for difficulty urinating.  Neurological: Positive for headaches.  Please see HPI. All other systems reviewed and negative.      Objective:   Physical Exam  Constitutional: She appears well-developed and well-nourished.  Skin: There is erythema.             Assessment & Plan:

## 2018-07-04 NOTE — Assessment & Plan Note (Addendum)
Will give her 7 days of levaquin We discussed her risk factors It has been several years since I have seen her- we discussed prophylactic PEN. She will consider. We discussed her PEN allergy- was childhood, she has since taken amoxil. Will see her back prn.

## 2019-05-16 ENCOUNTER — Other Ambulatory Visit: Payer: Self-pay | Admitting: Infectious Diseases

## 2019-05-16 DIAGNOSIS — L03113 Cellulitis of right upper limb: Secondary | ICD-10-CM

## 2019-05-16 MED ORDER — LEVOFLOXACIN 500 MG PO TABS: 500.0000 mg | ORAL_TABLET | Freq: Every day | ORAL | 2 refills | Status: DC

## 2019-05-16 NOTE — Addendum Note (Signed)
Addended by: Eugenia Mcalpine on: 05/16/2019 01:51 PM   Modules accepted: Orders

## 2019-05-16 NOTE — Progress Notes (Signed)
Rx phone in to pharmacy. Hard script printed instead of filled electronically. Jody Dunn

## 2019-05-17 ENCOUNTER — Telehealth: Payer: Self-pay

## 2019-05-17 NOTE — Telephone Encounter (Signed)
Patient called left voicemail in triage requesting evist and medication for cellulitis. LPN returned call left voicemail to contact office.  Jody Dunn

## 2019-07-11 ENCOUNTER — Other Ambulatory Visit: Payer: Self-pay

## 2019-07-11 DIAGNOSIS — Z20822 Contact with and (suspected) exposure to covid-19: Secondary | ICD-10-CM

## 2019-07-13 LAB — NOVEL CORONAVIRUS, NAA: SARS-CoV-2, NAA: NOT DETECTED

## 2019-08-01 ENCOUNTER — Telehealth: Payer: Self-pay

## 2019-08-01 NOTE — Telephone Encounter (Signed)
COVID-19 Pre-Screening Questions:08/01/19  Do you currently have a fever (>100 F), chills or unexplained body aches? NO  Are you currently experiencing new cough, shortness of breath, sore throat, runny nose?NO .  Have you recently travelled outside the state of [place] in the last 14 days?NO .  Have you been in contact with someone that is currently pending confirmation of Covid19 testing or has been confirmed to have the Covid19 virus?  NO  **If the patient answers NO to ALL questions -  advise the patient to please call the clinic before coming to the office should any symptoms develop.     

## 2019-08-02 ENCOUNTER — Ambulatory Visit (INDEPENDENT_AMBULATORY_CARE_PROVIDER_SITE_OTHER): Payer: BC Managed Care – PPO | Admitting: Infectious Diseases

## 2019-08-02 ENCOUNTER — Encounter: Payer: Self-pay | Admitting: Infectious Diseases

## 2019-08-02 ENCOUNTER — Other Ambulatory Visit: Payer: Self-pay

## 2019-08-02 DIAGNOSIS — L03113 Cellulitis of right upper limb: Secondary | ICD-10-CM

## 2019-08-02 MED ORDER — PENICILLIN V POTASSIUM 500 MG PO TABS: 250.0000 mg | ORAL_TABLET | Freq: Every day | ORAL | 4 refills | Status: DC

## 2019-08-02 NOTE — Progress Notes (Signed)
   Subjective:    Patient ID: Jody Dunn, female    DOB: 1976/08/16, 43 y.o.   MRN: [id number]  HPI 43 yo F with hx of breast CA bilateral mastectomies, 4 rounds CTX, xrt, tissue reconstruction. Followed at [facility] then at [facility]. She also had TAHBSO pro phylactically.  She has since had episodes of recurrent cellulitis. R arm.  She finished her last round of anbx on 08-30-16 (was given IV ceftriaxone x1) then po anbx cipro for 10 days.  She has prev gotten courses of IV anbx (ceftriaxone). 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   She was seen in ID on 09-23-16 and was given prn levaquin and was to consider prophylactic PEN. Repeat episode 06-2018.   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 States that is her second episode. She then got cellulitis x 2 (last was November of 2020). R arm. Shows pictures from her phone. Arm felt heavy and was painful. Also had n/v. Not clear if analgesic, anbx. Took 2 weeks for her arm to return to normal.  She is interested in trying prophylactic PEN. Had insomnia with levaquin.   Review of Systems  Constitutional: Negative for appetite change, chills, fever and unexpected weight change.  Respiratory: Negative for cough and shortness of breath.   Skin: Positive for rash.  Psychiatric/Behavioral: Positive for sleep disturbance.       Objective:   Physical Exam Constitutional:      Appearance: Normal appearance. She is obese.  HENT:     Mouth/Throat:     Mouth: Mucous membranes are moist.     Pharynx: No oropharyngeal exudate.  Eyes:     Extraocular Movements: Extraocular movements intact.     Pupils: Pupils are equal, round, and reactive to light.  Cardiovascular:     Rate and Rhythm: Normal rate and regular rhythm.  Pulmonary:     Effort: Pulmonary effort is normal.     Breath sounds: Normal breath sounds.  Abdominal:     General: Bowel sounds are normal.   Palpations: Abdomen is soft.  Skin:    General: Skin is warm and dry.     Findings: No rash.  Neurological:     Mental Status: She is alert.  Psychiatric:        Mood and Affect: Mood normal.           Assessment & Plan:

## 2019-08-02 NOTE — Assessment & Plan Note (Signed)
Was on ceftriaxone in 2018.  She has childhood PEN allergy hx. Mother told her she had rash and vomiting.  Her son had hives.  Will give her low dose of PEN and ask her to let us know if she has any side effects (rash, GI upset).  Will see her back in 1 year.

## 2020-07-31 ENCOUNTER — Ambulatory Visit: Payer: BC Managed Care – PPO | Admitting: Infectious Diseases

## 2020-07-31 ENCOUNTER — Encounter: Payer: Self-pay | Admitting: Infectious Diseases

## 2020-07-31 ENCOUNTER — Other Ambulatory Visit: Payer: Self-pay

## 2020-07-31 DIAGNOSIS — I7300 Raynaud's syndrome without gangrene: Secondary | ICD-10-CM | POA: Insufficient documentation

## 2020-07-31 DIAGNOSIS — L03113 Cellulitis of right upper limb: Secondary | ICD-10-CM | POA: Diagnosis not present

## 2020-07-31 NOTE — Assessment & Plan Note (Signed)
Will have her seen by Rheum I am uncertain that this is a firm dx.

## 2020-07-31 NOTE — Assessment & Plan Note (Signed)
She is doing well, off anbx She has her as needed anbx.  Will attribute most of this to her wt loss.  Will be available to her as needed.

## 2020-07-31 NOTE — Progress Notes (Signed)
   Subjective:    Patient ID: Jody Dunn, female    DOB: [birth year], 44 y.o.   MRN: [id number]  HPI 44yo F with hx of breast CA bilateral mastectomies, 4 rounds CTX, xrt, tissue reconstruction. Followed at [facility] then at [facility]. She also had TAHBSO pro phylactically.  She has since had episodes of recurrent cellulitis. R arm.  She finished her last round of anbx on 08-30-16 (was given IV ceftriaxone x1) then po anbx cipro for 10 days.  She has prev gotten courses of IV anbx (ceftriaxone). 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   She was seen in ID on 09-23-16 and was given prn levaquin and was to consider prophylactic PEN. Repeat episode 06-2018.   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 She has 6 days left.  She has been doing well, no recurrent episodes of cellulitis.  Has lost 50# and feels like this has been better.  No swelling in her RUE. Has been off PEN for 5 months.   She still has her "just in case anbx".  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 Has had on both her hands. Usually when she is under stress. Not associated with stress.   Review of Systems  Constitutional: Negative for chills, fever and unexpected weight change.  Skin: Negative for rash.  Please see HPI. All other systems reviewed and negative.      Objective:   Physical Exam Vitals reviewed.  Constitutional:      Appearance: Normal appearance. She is normal weight.  Skin:      Neurological:     Mental Status: She is alert.           Assessment & Plan:

## 2020-08-26 NOTE — Progress Notes (Signed)
Office Visit Note  Patient: Jody Dunn             Date of Birth: [birth year]           MRN: [id number]             PCP: Merrilee Seashore, MD Referring: Campbell Riches, MD Visit Date: 08/27/2020 Occupation: Elementary school teacher  Subjective:  New Patient (Initial Visit) and Joint Pain (Bilateral hands; bilateral feet (right>left))   History of Present Illness: Jody Dunn is a 45 y.o. female  She has a history of breast cancer s/p bilateral mastectomies, chemotherapy, xrt, bilateral salpingo-oophorectomy, and tissue reconstruction here for evaluation of raynaud's phenomenon. 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 These are not provoked with cold temperature but more provoked with stress reaction. Episodes are usually brief but rarely have lasted up to 45 minutes. She has no ulcers, sores, or nail pitting associated with this. She does not use any particular treatment besides hand warming.  She also notices hand pain worst around the 2nd-3rd MCP joints but also affecting some of the PIP joints on both hands. She is not sure whether these also swell, as she has baseline upper extremity lymphedema that frequently causes some swelling. She has flat, erythematous, photosensitive and heat sensitive rashes on the face and upper body. She has dry eyes and mouth but no ulcers, sores, or major problems with dentition. She gets sharp chest pain intermittently and has had multiple chest rays for this without any findings. She does not notice lymphadenopathy and denies any history of known blood clots.  She has had previous right 1st MTP cheilectomy no other major joint surgeries. She has seen oncology service with [place] then at [facility]. She also has subsequent lymphedema and has suffered multiple episodes of severe cellulitis treated with  ID clinic.    Activities of Daily Living:  Patient reports morning stiffness for 15-60 minutes.   Patient Reports nocturnal pain.  Difficulty dressing/grooming: Denies Difficulty climbing stairs: Reports Difficulty getting out of chair: Denies Difficulty using hands for taps, buttons, cutlery, and/or writing: Reports  Review of Systems  Constitutional: Positive for fatigue.  HENT: Positive for mouth dryness. Negative for mouth sores and nose dryness.   Eyes: Positive for visual disturbance and dryness. Negative for pain and itching.  Respiratory: Positive for shortness of breath. Negative for cough, hemoptysis and difficulty breathing.   Cardiovascular: Positive for chest pain and palpitations. Negative for swelling in legs/feet.  Gastrointestinal: Positive for abdominal pain. Negative for blood in stool, constipation and diarrhea.  Endocrine: Negative for increased urination.  Genitourinary: Negative for painful urination.  Musculoskeletal: Positive for arthralgias, joint pain, joint swelling, muscle weakness and morning stiffness. Negative for myalgias, muscle tenderness and myalgias.  Skin: Positive for color change, rash, redness and sensitivity to sunlight.  Allergic/Immunologic: Negative for susceptible to infections.  Neurological: Positive for dizziness, numbness, headaches, memory loss and weakness.  Hematological: Negative for swollen glands.  Psychiatric/Behavioral: Positive for confusion and sleep disturbance.    PMFS History:  Patient Active Problem List   Diagnosis Date Noted   Arthralgia of both hands 08/27/2020   Raynaud phenomenon 07/31/2020   Cellulitis 04/29/2015   Insomnia 02/06/2014   Incisional hernia, without obstruction or gangrene 01/25/2014   Lymphedema of arm 11/14/2013   Hot flashes due to surgical  menopause 09/18/2013   Anxiety 09/18/2013   S/P bilateral salpingo-oophorectomy 07/11/2013   Breast cancer of upper-outer quadrant of right  female breast ([place]) 03/12/2013   S/P hysterectomy 11/13/2012   Lactose intolerance    Migraines    Iron (Fe) deficiency anemia    ALLERGIC RHINITIS, SEASONAL 12/15/2006    Past Medical History:  Diagnosis Date   Allergy    Anxiety 09/18/2013   Asthma    exercise induced   Back pain    Breast cancer ([place]) 01/2012   Cellulitis    Right arm, per patient   Hyperlipidemia    Iron (Fe) deficiency anemia    Lactose intolerance    Lymphedema    Right arm, per patient   Migraines     Family History  Problem Relation Age of Onset   Migraines Mother    Chiari malformation Mother    Cancer Father        prostate   Diabetes Father    Arthritis Father        Psoriatic Arthritis/Rheumatoid Arthritis- patient is unsure(???)   Cancer Paternal Aunt 38       Breast cancer (half sibling, related through father)   Spina bifida Paternal Uncle 0       died around 72-83 months of age   66 Maternal Grandmother 97       colon cancer   Dementia Maternal Grandfather    Cancer Paternal Grandmother        breast cancer between 83-42   Cancer Paternal Grandfather        brain cancer   Cancer Other        Paternal grandmother's siblings had breast cancer and pancreatic cancer   Cancer Other 12       maternal great grandmother with breast cancer   Past Surgical History:  Procedure Laterality Date   ABDOMINAL HYSTERECTOMY     APPENDECTOMY     BREAST BIOPSY  02/09/2012   CHEILECTOMY Right 2017   Per patient, right great toe   KNEE SURGERY     X2   MASTECTOMY MODIFIED RADICAL  06/28/2012   Procedure: MASTECTOMY MODIFIED RADICAL;  Surgeon: Stark Klein, MD;  Location: [facility];  Service: General;  Laterality: Right;  Right Modified Radical Mastectomy and Left Simple Mastectomy   PORTACATH PLACEMENT  02/21/2012   Procedure: INSERTION PORT-A-CATH;  Surgeon: Stark Klein, MD;  Location: [facility];  Service: General;  Laterality: N/A;   ROBOTIC ASSISTED TOTAL  HYSTERECTOMY WITH BILATERAL SALPINGO OOPHERECTOMY N/A 11/13/2012   Procedure: ROBOTIC ASSISTED TOTAL HYSTERECTOMY WITH BILATERAL SALPINGO OOPHORECTOMY;  Surgeon: Lovenia Kim, MD;  Location: [facility];  Service: Gynecology;  Laterality: N/A;   SHOULDER SURGERY     SIMPLE MASTECTOMY WITH AXILLARY SENTINEL NODE BIOPSY  06/28/2012   Procedure: SIMPLE MASTECTOMY;  Surgeon: Stark Klein, MD;  Location: [facility];  Service: General;  Laterality: Left;   SKIN BIOPSY  02/21/2012   Procedure: BIOPSY SKIN;  Surgeon: Stark Klein, MD;  Location: [facility];  Service: General;  Laterality: Right;   Social History   Social History Narrative   Not on file   Immunization History  Administered Date(s) Administered   Influenza,inj,Quad PF,6-35 Mos 06/28/2018, 06/19/2020   Influenza,trivalent, recombinat, inj, PF 05/02/2019   PFIZER SARS-COV-2 Vaccination 10/23/2019, 11/20/2019, 08/12/2020   PPD Test 03/30/2019   Tdap 03/02/2011, 10/26/2018     Objective: Vital Signs: BP (!) 138/98 (BP Location: Left Arm, Patient Position: Sitting, Cuff Size: Normal)  Pulse 73    Ht 5\' 8"  (1.727 m)    Wt 163 lb 9.6 oz (74.2 kg)    LMP 10/24/2012    BMI 24.88 kg/m    Physical Exam HENT:     Right Ear: External ear normal.     Left Ear: External ear normal.     Mouth/Throat:     Mouth: Mucous membranes are moist.     Pharynx: Oropharynx is clear.  Eyes:     Conjunctiva/sclera: Conjunctivae normal.  Cardiovascular:     Rate and Rhythm: Normal rate and regular rhythm.  Pulmonary:     Effort: Pulmonary effort is normal.     Breath sounds: Normal breath sounds.  Skin:    General: Skin is warm and dry.     Comments: Central facial erythema flat with no scaling or lesions and extends across nasolabial folds  Nailfold capillaries demonstrate mild amount of dropout and tortuousity  Neurological:     General: No focal deficit present.     Mental Status: She is alert.  Psychiatric:        Mood and Affect: Mood  normal.     Musculoskeletal Exam:  Neck full range of motion no tenderness Shoulder, elbow, wrist, fingers full range of motion, tenderness over 2nd-3rd MCP joints bilaterally without swelling or erythema Knees full range of motion no tenderness or swelling patellofemoral crepitus present Ankles, MTPs full ROM no swelling, well healed surgical scar 1st MTP   Investigation: No additional findings.  Imaging: No results found.  Recent Labs: Lab Results  Component Value Date   WBC 6.7 05/27/2016   HGB 13.7 05/27/2016   PLT 271 05/27/2016   NA 139 05/27/2016   K 3.3 (L) 05/27/2016   CL 102 05/09/2014   CO2 25 05/27/2016   GLUCOSE 119 05/27/2016   BUN 12.6 05/27/2016   CREATININE 1.0 05/27/2016   BILITOT 0.47 05/27/2016   ALKPHOS 111 05/27/2016   AST 36 (H) 05/27/2016   ALT 45 05/27/2016   PROT 7.9 05/27/2016   ALBUMIN 4.0 05/27/2016   CALCIUM 9.3 05/27/2016   GFRAA 90 (L) 11/10/2012    Speciality Comments: No specialty comments available.  Procedures:  No procedures performed Allergies: Zofran [ondansetron hcl]; Oxycodone; Adhesive [tape]; Clindamycin/lincomycin; Hydrocodone-acetaminophen; Iodine; Duraprep [antiseptic products, misc.]; and Penicillins   Assessment / Plan:     Visit Diagnoses: Raynaud's phenomenon without gangrene - Arthralgia of both hands - Plan: ANA, Anti-scleroderma antibody, RNP Antibody, Anti-Smith antibody, C3 and C4, Beta-2 glycoprotein antibodies, Cardiolipin antibodies, IgG, IgM, IgA, Lupus Anticoagulant Eval w/Reflex, Anti-DNA antibody, double-stranded, Sjogrens syndrome-A extractable nuclear antibody, Sjogrens syndrome-B extractable nuclear antibody  Atypical raynaud's due to lack of correlation with cold exposure however does have abnormal nailfold capillary structure which is more suggestive for underlying process. MCP joint pains but no clear inflammatory arthritis seen on exam today. Rash does not look like typical malar rash. Will check ENA  serology panel and APS Abs. If isolated raynaud's without much other evidence of systemic disease can consider trial of CCB versus DMARD treatment.  Orders: Orders Placed This Encounter  Procedures   ANA   Anti-scleroderma antibody   RNP Antibody   Anti-Smith antibody   C3 and C4   Beta-2 glycoprotein antibodies   Cardiolipin antibodies, IgG, IgM, IgA   Lupus Anticoagulant Eval w/Reflex   Anti-DNA antibody, double-stranded   Sjogrens syndrome-A extractable nuclear antibody   Sjogrens syndrome-B extractable nuclear antibody   No orders of the defined types were placed in  this encounter.   Follow-Up Instructions: Return in about 2 weeks (around 09/10/2020) for New pt f/u.   Fuller Plan, MD  Note - This record has been created using [employer].  Chart creation errors have been sought, but may not always  have been located. Such creation errors do not reflect on  the standard of medical care.

## 2020-08-27 ENCOUNTER — Encounter: Payer: Self-pay | Admitting: Internal Medicine

## 2020-08-27 ENCOUNTER — Other Ambulatory Visit: Payer: Self-pay

## 2020-08-27 ENCOUNTER — Ambulatory Visit (INDEPENDENT_AMBULATORY_CARE_PROVIDER_SITE_OTHER): Payer: BC Managed Care – PPO | Admitting: Internal Medicine

## 2020-08-27 VITALS — BP 138/98 | HR 73 | Ht 68.0 in | Wt 163.6 lb

## 2020-08-27 DIAGNOSIS — M25541 Pain in joints of right hand: Secondary | ICD-10-CM | POA: Diagnosis not present

## 2020-08-27 DIAGNOSIS — I7300 Raynaud's syndrome without gangrene: Secondary | ICD-10-CM | POA: Diagnosis not present

## 2020-08-27 DIAGNOSIS — M25542 Pain in joints of left hand: Secondary | ICD-10-CM | POA: Diagnosis not present

## 2020-08-31 LAB — CARDIOLIPIN ANTIBODIES, IGG, IGM, IGA
Anticardiolipin IgA: <2 APL-U/mL
Anticardiolipin IgG: <2 GPL-U/mL
Anticardiolipin IgM: <2 MPL-U/mL

## 2020-08-31 LAB — SJOGRENS SYNDROME-B EXTRACTABLE NUCLEAR ANTIBODY: SSB (La) (ENA) Antibody, IgG: <1 AI

## 2020-08-31 LAB — ANTI-DNA ANTIBODY, DOUBLE-STRANDED: ds DNA Ab: 2 IU/mL

## 2020-08-31 LAB — LUPUS ANTICOAGULANT EVAL W/ REFLEX
PTT-LA Screen: 29 s (ref <=40)
dRVVT: 29 s (ref <=45)

## 2020-08-31 LAB — RNP ANTIBODY: Ribonucleic Protein(ENA) Antibody, IgG: <1 AI

## 2020-08-31 LAB — SJOGRENS SYNDROME-A EXTRACTABLE NUCLEAR ANTIBODY: SSA (Ro) (ENA) Antibody, IgG: <1 AI

## 2020-08-31 LAB — C3 AND C4
C3 Complement: 145 mg/dL (ref 83–193)
C4 Complement: 27 mg/dL (ref 15–57)

## 2020-08-31 LAB — ANTI-SMITH ANTIBODY: ENA SM Ab Ser-aCnc: <1 AI

## 2020-08-31 LAB — ANA: Anti Nuclear Antibody (ANA): NEGATIVE

## 2020-08-31 LAB — BETA-2 GLYCOPROTEIN ANTIBODIES
Beta-2 Glyco 1 IgA: <2 U/mL
Beta-2 Glyco 1 IgM: <2 U/mL
Beta-2 Glyco I IgG: <2 U/mL

## 2020-08-31 LAB — ANTI-SCLERODERMA ANTIBODY: Scleroderma (Scl-70) (ENA) Antibody, IgG: <1 AI

## 2020-09-03 NOTE — Progress Notes (Signed)
Antibody tests for lupus and related disease are all negative also the antiphospolipid antibodies that can be associated with abnormal clotting are negative. Based on this your raynaud's phenomenon may be an isolated issue without associated systemic disease. We can follow up as planned might benefit with trial of treatment for the raynaud's specifically.

## 2020-09-09 NOTE — Progress Notes (Deleted)
Office Visit Note  Patient: Jody Dunn             Date of Birth: 1976/05/14           MRN: [id number]             PCP: Merrilee Seashore, MD Referring: Merrilee Seashore, MD Visit Date: 09/10/2020   Subjective:  No chief complaint on file.   History of Present Illness: Jody Dunn is a 45 y.o. female here for follow up of raynaud's symptoms.***   History of Present Illness: Jody Dunn is a 45 y.o. female  She has a history of breast cancer s/p bilateral mastectomies, chemotherapy, xrt, bilateral salpingo-oophorectomy, and tissue reconstruction here for evaluation of raynaud's phenomenon. 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 These are not provoked with cold temperature but more provoked with stress reaction. Episodes are usually brief but rarely have lasted up to 45 minutes. She has no ulcers, sores, or nail pitting associated with this. She does not use any particular treatment besides hand warming.  She also notices hand pain worst around the 2nd-3rd MCP joints but also affecting some of the PIP joints on both hands. She is not sure whether these also swell, as she has baseline upper extremity lymphedema that frequently causes some swelling. She has flat, erythematous, photosensitive and heat sensitive rashes on the face and upper body. She has dry eyes and mouth but no ulcers, sores, or major problems with dentition. She gets sharp chest pain intermittently and has had multiple chest rays for this without any findings. She does not notice lymphadenopathy and denies any history of known blood clots.  She has had previous right 1st MTP cheilectomy no other major joint surgeries. She has seen oncology service with [place] then at [facility]. She also has subsequent lymphedema and has suffered multiple episodes of severe  cellulitis treated with ID clinic. ***  Antibody tests for lupus and related disease are all negative also the antiphospolipid antibodies that can be associated with abnormal clotting are negative. Based on this your raynaud's phenomenon may be an isolated issue without associated systemic disease. We can follow up as planned might benefit with trial of treatment for the raynaud's specifically.   No Rheumatology ROS completed.   PMFS History:  Patient Active Problem List   Diagnosis Date Noted  . Arthralgia of both hands 08/27/2020  . Raynaud phenomenon 07/31/2020  . Cellulitis 04/29/2015  . Insomnia 02/06/2014  . Incisional hernia, without obstruction or gangrene 01/25/2014  . Lymphedema of arm 11/14/2013  . Hot flashes due to surgical menopause 09/18/2013  . Anxiety 09/18/2013  . S/P bilateral salpingo-oophorectomy 07/11/2013  . Breast cancer of upper-outer quadrant of right female breast ([place]) 03/12/2013  . S/P hysterectomy 11/13/2012  . Lactose intolerance   . Migraines   . Iron (Fe) deficiency anemia   . ALLERGIC RHINITIS, SEASONAL 12/15/2006    Past Medical History:  Diagnosis Date  . Allergy   . Anxiety 09/18/2013  . Asthma    exercise induced  . Back pain   . Breast cancer ([place]) 01/2012  . Cellulitis    Right arm, per patient  . Hyperlipidemia   . Iron (Fe) deficiency anemia   . Lactose intolerance   . Lymphedema    Right arm, per patient  . Migraines  Family History  Problem Relation Age of Onset  . Migraines Mother   . Chiari malformation Mother   . Cancer Father        prostate  . Diabetes Father   . Arthritis Father        Psoriatic Arthritis/Rheumatoid Arthritis- patient is unsure(???)  . Cancer Paternal Aunt 77       Breast cancer (half sibling, related through father)  . Spina bifida Paternal Uncle 0       died around 21-41 months of age  . Cancer Maternal Grandmother 36       colon cancer  . Dementia Maternal Grandfather   . Cancer Paternal  Grandmother        breast cancer between 43-42  . Cancer Paternal Grandfather        brain cancer  . Cancer Other        Paternal grandmother's siblings had breast cancer and pancreatic cancer  . Cancer Other 69       maternal great grandmother with breast cancer   Past Surgical History:  Procedure Laterality Date  . ABDOMINAL HYSTERECTOMY    . APPENDECTOMY    . BREAST BIOPSY  02/09/2012  . CHEILECTOMY Right 2017   Per patient, right great toe  . KNEE SURGERY     X2  . MASTECTOMY MODIFIED RADICAL  06/28/2012   Procedure: MASTECTOMY MODIFIED RADICAL;  Surgeon: Stark Klein, MD;  Location: [facility];  Service: General;  Laterality: Right;  Right Modified Radical Mastectomy and Left Simple Mastectomy  . PORTACATH PLACEMENT  02/21/2012   Procedure: INSERTION PORT-A-CATH;  Surgeon: Stark Klein, MD;  Location: [facility];  Service: General;  Laterality: N/A;  . ROBOTIC ASSISTED TOTAL HYSTERECTOMY WITH BILATERAL SALPINGO OOPHERECTOMY N/A 11/13/2012   Procedure: ROBOTIC ASSISTED TOTAL HYSTERECTOMY WITH BILATERAL SALPINGO OOPHORECTOMY;  Surgeon: Lovenia Kim, MD;  Location: [facility];  Service: Gynecology;  Laterality: N/A;  . SHOULDER SURGERY    . SIMPLE MASTECTOMY WITH AXILLARY SENTINEL NODE BIOPSY  06/28/2012   Procedure: SIMPLE MASTECTOMY;  Surgeon: Stark Klein, MD;  Location: [facility];  Service: General;  Laterality: Left;  . SKIN BIOPSY  02/21/2012   Procedure: BIOPSY SKIN;  Surgeon: Stark Klein, MD;  Location: [facility];  Service: General;  Laterality: Right;   Social History   Social History Narrative  . Not on file   Immunization History  Administered Date(s) Administered  . Influenza,inj,Quad PF,6-35 Mos 06/28/2018, 06/19/2020  . Influenza,trivalent, recombinat, inj, PF 05/02/2019  . PFIZER(Purple Top)SARS-COV-2 Vaccination 10/23/2019, 11/20/2019, 08/12/2020  . PPD Test 03/30/2019  . Tdap 03/02/2011, 10/26/2018     Objective: Vital Signs: LMP 10/24/2012    Physical Exam    Musculoskeletal Exam: ***  CDAI Exam: CDAI Score: -- Patient Global: --; Provider Global: -- Swollen: --; Tender: -- Joint Exam 09/10/2020   No joint exam has been documented for this visit   There is currently no information documented on the homunculus. Go to the Rheumatology activity and complete the homunculus joint exam.  Investigation: No additional findings.  Imaging: No results found.  Recent Labs: Lab Results  Component Value Date   WBC 6.7 05/27/2016   HGB 13.7 05/27/2016   PLT 271 05/27/2016   NA 139 05/27/2016   K 3.3 (L) 05/27/2016   CL 102 05/09/2014   CO2 25 05/27/2016   GLUCOSE 119 05/27/2016   BUN 12.6 05/27/2016   CREATININE 1.0 05/27/2016   BILITOT 0.47 05/27/2016   ALKPHOS 111 05/27/2016   AST  36 (H) 05/27/2016   ALT 45 05/27/2016   PROT 7.9 05/27/2016   ALBUMIN 4.0 05/27/2016   CALCIUM 9.3 05/27/2016   GFRAA 90 (L) 11/10/2012    Speciality Comments: No specialty comments available.  Procedures:  No procedures performed Allergies: Zofran [ondansetron hcl]; Oxycodone; Adhesive [tape]; Clindamycin/lincomycin; Hydrocodone-acetaminophen; Iodine; Duraprep [antiseptic products, misc.]; and Penicillins   Assessment / Plan:     Visit Diagnoses: No diagnosis found.  Orders: No orders of the defined types were placed in this encounter.  No orders of the defined types were placed in this encounter.   Face-to-face time spent with patient was *** minutes. Greater than 50% of time was spent in counseling and coordination of care.  Follow-Up Instructions: No follow-ups on file.   Collier Salina, MD  Note - This record has been created using [employer].  Chart creation errors have been sought, but may not always  have been located. Such creation errors do not reflect on  the standard of medical care.

## 2020-09-10 ENCOUNTER — Ambulatory Visit: Payer: Self-pay | Admitting: Internal Medicine

## 2020-09-18 ENCOUNTER — Other Ambulatory Visit: Payer: Self-pay | Admitting: Infectious Diseases

## 2020-09-18 ENCOUNTER — Telehealth: Payer: Self-pay

## 2020-09-18 DIAGNOSIS — L03113 Cellulitis of right upper limb: Secondary | ICD-10-CM

## 2020-09-18 NOTE — Telephone Encounter (Signed)
Received refill request for penicillin 500 mg tablet. Per last note patient has been off medication for 5 months. Is following up with ID if needed. Will forward message to MD to advise on refill.

## 2020-09-18 NOTE — Telephone Encounter (Signed)
Please refill. Thanks!

## 2020-09-24 ENCOUNTER — Other Ambulatory Visit: Payer: Self-pay

## 2020-09-24 ENCOUNTER — Ambulatory Visit (INDEPENDENT_AMBULATORY_CARE_PROVIDER_SITE_OTHER): Payer: 59 | Admitting: Internal Medicine

## 2020-09-24 ENCOUNTER — Encounter: Payer: Self-pay | Admitting: Internal Medicine

## 2020-09-24 VITALS — BP 114/84 | HR 92 | Ht 68.0 in | Wt 161.0 lb

## 2020-09-24 DIAGNOSIS — I7300 Raynaud's syndrome without gangrene: Secondary | ICD-10-CM | POA: Diagnosis not present

## 2020-09-24 NOTE — Progress Notes (Signed)
Office Visit Note  Patient: Jody Dunn             Date of Birth: 02-24-1976           MRN: [id number]             PCP: Merrilee Seashore, MD Referring: Merrilee Seashore, MD Visit Date: 09/24/2020   Subjective:   History of Present Illness: Jody Dunn is a 45 y.o. female with a history of breast cancer s/p bilateral mastectomies, chemotherapy, xrt, bilateral salpingo-oophorectomy, and tissue reconstruction here for follow up of raynaud's phenomenon. At last visit autoantibody profile testing was unremarkable. She was recommended to follow up for repeat exam and discuss options to treat raynaud's symptoms. Since that visit she did have an episode of taking penicillin again otherwise no medical events. She thinks episodes are 3-4 times weekly. Wen indoors and unprovoked often has just the paresthesias without discoloration. When outside with cold and discoloration can take up to an hour to fully resolve but no residual changes. Since last visit no major events or changes does continue to experience symptoms.   Review of Systems  Constitutional: Positive for fatigue.  HENT: Positive for mouth dryness and nose dryness. Negative for mouth sores.   Eyes: Positive for pain, itching, visual disturbance and dryness.  Respiratory: Negative for cough, hemoptysis, shortness of breath and difficulty breathing.   Cardiovascular: Negative for chest pain, palpitations and swelling in legs/feet.  Gastrointestinal: Negative for abdominal pain, blood in stool, constipation and diarrhea.  Endocrine: Negative for increased urination.  Genitourinary: Negative for painful urination.  Musculoskeletal: Positive for arthralgias, joint pain and morning stiffness. Negative for joint swelling, myalgias, muscle weakness, muscle tenderness and myalgias.  Skin: Positive for color change, rash and redness.  Allergic/Immunologic: Positive for susceptible to infections.  Neurological: Positive for dizziness,  headaches and memory loss. Negative for numbness and weakness.  Hematological: Negative for swollen glands.  Psychiatric/Behavioral: Positive for confusion and sleep disturbance.    PMFS History:  Patient Active Problem List   Diagnosis Date Noted   Arthralgia of both hands 08/27/2020   Raynaud phenomenon 07/31/2020   Cellulitis 04/29/2015   Insomnia 02/06/2014   Incisional hernia, without obstruction or gangrene 01/25/2014   Lymphedema of arm 11/14/2013   Hot flashes due to surgical menopause 09/18/2013   Anxiety 09/18/2013   S/P bilateral salpingo-oophorectomy 07/11/2013   Breast cancer of upper-outer quadrant of right female breast ([place]) 03/12/2013   S/P hysterectomy 11/13/2012   Lactose intolerance    Migraines    Iron (Fe) deficiency anemia    ALLERGIC RHINITIS, SEASONAL 12/15/2006    Past Medical History:  Diagnosis Date   Allergy    Anxiety 09/18/2013   Asthma    exercise induced   Back pain    Breast cancer ([place]) 01/2012   Cellulitis    Right arm, per patient   Hyperlipidemia    Iron (Fe) deficiency anemia    Lactose intolerance    Lymphedema    Right arm, per patient   Migraines     Family History  Problem Relation Age of Onset   Migraines Mother    Chiari malformation Mother    Cancer Father        prostate   Diabetes Father    Arthritis Father        Psoriatic Arthritis/Rheumatoid Arthritis- patient is unsure(???)   Cancer Paternal Aunt 76       Breast cancer (half sibling, related through father)  Spina bifida Paternal Uncle 0       died around 28-52 months of age   Cancer Maternal Grandmother 60       colon cancer   Dementia Maternal Grandfather    Cancer Paternal Grandmother        breast cancer between 76-42   Cancer Paternal Grandfather        brain cancer   Cancer Other        Paternal grandmother's siblings had breast cancer and pancreatic cancer   Cancer Other 3       maternal great grandmother  with breast cancer   Past Surgical History:  Procedure Laterality Date   ABDOMINAL HYSTERECTOMY     APPENDECTOMY     BREAST BIOPSY  02/09/2012   CHEILECTOMY Right 2017   Per patient, right great toe   KNEE SURGERY     X2   MASTECTOMY MODIFIED RADICAL  06/28/2012   Procedure: MASTECTOMY MODIFIED RADICAL;  Surgeon: Stark Klein, MD;  Location: [facility];  Service: General;  Laterality: Right;  Right Modified Radical Mastectomy and Left Simple Mastectomy   PORTACATH PLACEMENT  02/21/2012   Procedure: INSERTION PORT-A-CATH;  Surgeon: Stark Klein, MD;  Location: [facility];  Service: General;  Laterality: N/A;   ROBOTIC ASSISTED TOTAL HYSTERECTOMY WITH BILATERAL SALPINGO OOPHERECTOMY N/A 11/13/2012   Procedure: ROBOTIC ASSISTED TOTAL HYSTERECTOMY WITH BILATERAL SALPINGO OOPHORECTOMY;  Surgeon: Lovenia Kim, MD;  Location: [facility];  Service: Gynecology;  Laterality: N/A;   SHOULDER SURGERY     SIMPLE MASTECTOMY WITH AXILLARY SENTINEL NODE BIOPSY  06/28/2012   Procedure: SIMPLE MASTECTOMY;  Surgeon: Stark Klein, MD;  Location: [facility];  Service: General;  Laterality: Left;   SKIN BIOPSY  02/21/2012   Procedure: BIOPSY SKIN;  Surgeon: Stark Klein, MD;  Location: [facility];  Service: General;  Laterality: Right;   Social History   Social History Narrative   Not on file   Immunization History  Administered Date(s) Administered   Influenza,inj,Quad PF,6-35 Mos 06/28/2018, 06/19/2020   Influenza,trivalent, recombinat, inj, PF 05/02/2019   PFIZER(Purple Top)SARS-COV-2 Vaccination 10/23/2019, 11/20/2019, 08/12/2020   PPD Test 03/30/2019   Tdap 03/02/2011, 10/26/2018     Objective: Vital Signs: BP 114/84 (BP Location: Left Arm, Patient Position: Sitting, Cuff Size: Normal)    Pulse 92    Ht 5\' 8"  (1.727 m)    Wt 161 lb (73 kg)    LMP 10/24/2012    BMI 24.48 kg/m    Physical Exam HENT:     Right Ear: External ear normal.     Left Ear: External ear normal.  Skin:    General: Skin is  warm and dry.     Findings: No lesion or rash.  Neurological:     General: No focal deficit present.     Mental Status: She is alert.     Musculoskeletal Exam:  Fingers full range of motion no tenderness or swelling  Investigation: No additional findings.  Imaging: No results found.  Recent Labs: Lab Results  Component Value Date   WBC 6.7 05/27/2016   HGB 13.7 05/27/2016   PLT 271 05/27/2016   NA 139 05/27/2016   K 3.3 (L) 05/27/2016   CL 102 05/09/2014   CO2 25 05/27/2016   GLUCOSE 119 05/27/2016   BUN 12.6 05/27/2016   CREATININE 1.0 05/27/2016   BILITOT 0.47 05/27/2016   ALKPHOS 111 05/27/2016   AST 36 (H) 05/27/2016   ALT 45 05/27/2016   PROT 7.9 05/27/2016  ALBUMIN 4.0 05/27/2016   CALCIUM 9.3 05/27/2016   GFRAA 90 (L) 11/10/2012    Speciality Comments: No specialty comments available.  Procedures:  No procedures performed Allergies: Zofran [ondansetron hcl]; Oxycodone; Adhesive [tape]; Clindamycin/lincomycin; Hydrocodone-acetaminophen; Iodine; Duraprep [antiseptic products, misc.]; and Penicillins   Assessment / Plan:     Visit Diagnoses: Raynaud's phenomenon without gangrene  Discussed lab results are all negative suggesting against underlying disease process. No ischemic injury or changes of fingers so management would be symptomatic rather than disease modifying. She is more interested in just observation at this time versus starting a calcium channel blocker for the raynaud's symptoms. We will follow up in several months or if she has worsening symptoms discussed such as blisters, ulcers, pitting etc.  Orders: No orders of the defined types were placed in this encounter.  No orders of the defined types were placed in this encounter.   Follow-Up Instructions: Return in about 6 months (around 03/24/2021) for Atypical Raynaud's.   Collier Salina, MD  Note - This record has been created using [employer].  Chart creation errors have been  sought, but may not always  have been located. Such creation errors do not reflect on  the standard of medical care.

## 2020-11-24 ENCOUNTER — Other Ambulatory Visit: Payer: Self-pay

## 2020-11-24 ENCOUNTER — Other Ambulatory Visit: Payer: Self-pay | Admitting: Infectious Diseases

## 2020-11-24 DIAGNOSIS — L03113 Cellulitis of right upper limb: Secondary | ICD-10-CM

## 2020-11-24 MED ORDER — AMOXICILLIN 500 MG PO TABS: 500.0000 mg | ORAL_TABLET | Freq: Three times a day (TID) | ORAL | 0 refills | Status: DC

## 2020-11-24 MED ORDER — AMOXICILLIN 500 MG PO TABS
500.0000 mg | ORAL_TABLET | Freq: Two times a day (BID) | ORAL | 0 refills | Status: DC
Start: 2020-11-24 — End: 2020-11-24

## 2020-11-26 ENCOUNTER — Other Ambulatory Visit: Payer: Self-pay

## 2020-11-26 ENCOUNTER — Ambulatory Visit (INDEPENDENT_AMBULATORY_CARE_PROVIDER_SITE_OTHER): Payer: 59 | Admitting: Infectious Disease

## 2020-11-26 ENCOUNTER — Encounter: Payer: Self-pay | Admitting: Infectious Disease

## 2020-11-26 VITALS — BP 137/88 | HR 88 | Temp 97.5°F | Wt 161.0 lb

## 2020-11-26 DIAGNOSIS — J301 Allergic rhinitis due to pollen: Secondary | ICD-10-CM | POA: Diagnosis not present

## 2020-11-26 DIAGNOSIS — I7300 Raynaud's syndrome without gangrene: Secondary | ICD-10-CM

## 2020-11-26 DIAGNOSIS — C50411 Malignant neoplasm of upper-outer quadrant of right female breast: Secondary | ICD-10-CM

## 2020-11-26 DIAGNOSIS — L03113 Cellulitis of right upper limb: Secondary | ICD-10-CM | POA: Diagnosis not present

## 2020-11-26 DIAGNOSIS — I890 Lymphedema, not elsewhere classified: Secondary | ICD-10-CM

## 2020-11-26 MED ORDER — AMOXICILLIN 500 MG PO TABS: 500.0000 mg | ORAL_TABLET | Freq: Three times a day (TID) | ORAL | 4 refills | Status: DC

## 2020-11-26 NOTE — Telephone Encounter (Signed)
Received call from patient, she states that in regards to the cellulitis she is feeling better and she has not been getting worse, but she feels like she should be more improved at this point. She requests an office visit with a provider. Dr. Johnnye Sima is not in the office this week, RN scheduled patient with Dr. Tommy Medal this afternoon.   Beryle Flock, RN

## 2020-11-26 NOTE — Progress Notes (Signed)
Subjective:  Chief complaint fever chills and onset of cellulitis in her right arm  Patient ID: Jody Dunn, female    DOB: 10-08-1975, 45 y.o.   MRN: [id number]  HPI  Jody Dunn is a 45 year old Caucasian female with a history of breast cancer status post bilateral mastectomies lymph node resections chemotherapy radiation therapy reconstructive surgery who has had problems with chronic right lymphedema and recurrent cellulitis in the affected arm.  She has been followed by my partner Dr. Johnnye Sima and had been on prophylactic penicillin until August 2021 when she decided to see how she would do off of the antibiotics.  She saw Dr. Johnnye Sima in December and was doing well.  She felt that she was having less frequent episodes of cellulitis now despite being off antibiotics in part due to weight loss.  She has been formally diagnosed with Raynaud's and is managing that the best she can.  She had established care with a surgeon in [facility] who can perform surgery to treat lymphedema which involves what sounds like transplantation of lymphoid tissue from the leg to the affected arm.  She has several people she has met who have undergone the surgery successfully.  She was working towards doing this but then COVID-19 affected the healthcare system and she put that thought on hold.  She has noticed also that she has been suffering from more frequent urinary tract infections now than before.  She says that on Sunday she awoke from sleep with a high fever of 102 degrees and acute onset of erythema in her right arm.  She called our clinic on Monday and amoxicillin 500 mg 3 times daily was started.  She notes that she did not have antibiotics at home that she was not allergic to and that had not expired that she could find to take at home.  She has had improvement in her erythema and resolution of her fevers since initiation of amoxicillin.  She is also taking tramadol.  She has noted some itching in her  arms right greater than left and is wondering if this is related to the infection or something else.  Certainly could be related to that but also could be related to the tramadol less likely would seem to be the amoxicillin.  Past Medical History:  Diagnosis Date  . Allergy   . Anxiety 09/18/2013  . Asthma    exercise induced  . Back pain   . Breast cancer ([place]) 01/2012  . Cellulitis    Right arm, per patient  . Hyperlipidemia   . Iron (Fe) deficiency anemia   . Lactose intolerance   . Lymphedema    Right arm, per patient  . Migraines     Past Surgical History:  Procedure Laterality Date  . ABDOMINAL HYSTERECTOMY    . APPENDECTOMY    . BREAST BIOPSY  02/09/2012  . CHEILECTOMY Right 2017   Per patient, right great toe  . KNEE SURGERY     X2  . MASTECTOMY MODIFIED RADICAL  06/28/2012   Procedure: MASTECTOMY MODIFIED RADICAL;  Surgeon: Stark Klein, MD;  [facility];  Service: General;  Laterality: Right;  Right Modified Radical Mastectomy and Left Simple Mastectomy  . PORTACATH PLACEMENT  02/21/2012   Procedure: INSERTION PORT-A-CATH;  Surgeon: Stark Klein, MD;  [facility];  Service: General;  Laterality: N/A;  . ROBOTIC ASSISTED TOTAL HYSTERECTOMY WITH BILATERAL SALPINGO OOPHERECTOMY N/A 11/13/2012   Procedure: ROBOTIC ASSISTED TOTAL HYSTERECTOMY WITH BILATERAL SALPINGO OOPHORECTOMY;  Surgeon:  Lovenia Kim, MD;  Location: [place] [facility];  Service: Gynecology;  Laterality: N/A;  . SHOULDER SURGERY    . SIMPLE MASTECTOMY WITH AXILLARY SENTINEL NODE BIOPSY  06/28/2012   Procedure: SIMPLE MASTECTOMY;  Surgeon: Stark Klein, MD;  [facility];  Service: General;  Laterality: Left;  . SKIN BIOPSY  02/21/2012   Procedure: BIOPSY SKIN;  Surgeon: Stark Klein, MD;  [facility];  Service: General;  Laterality: Right;    Family History  Problem Relation Age of Onset  . Migraines Mother   . Chiari malformation Mother   . Cancer Father        prostate  . Diabetes Father    . Arthritis Father        Psoriatic Arthritis/Rheumatoid Arthritis- patient is unsure(???)  . Cancer Paternal Aunt 72       Breast cancer (half sibling, related through father)  . Spina bifida Paternal Uncle 0       died around 11-72 months of age  . Cancer Maternal Grandmother 81       colon cancer  . Dementia Maternal Grandfather   . Cancer Paternal Grandmother        breast cancer between 55-42  . Cancer Paternal Grandfather        brain cancer  . Cancer Other        Paternal grandmother's siblings had breast cancer and pancreatic cancer  . Cancer Other 21       maternal great grandmother with breast cancer      Social History   Socioeconomic History  . Marital status: Married    Spouse name: Not on file  . Number of children: Not on file  . Years of education: Not on file  . Highest education level: Not on file  Occupational History  . Not on file  Tobacco Use  . Smoking status: Never Smoker  . Smokeless tobacco: Never Used  Vaping Use  . Vaping Use: Never used  Substance and Sexual Activity  . Alcohol use: Yes    Alcohol/week: 5.0 - 7.0 standard drinks    Types: 5 - 7 Standard drinks or equivalent per week    Comment: OCCASIONAL  . Drug use: No  . Sexual activity: Yes    Birth control/protection: Surgical  Other Topics Concern  . Not on file  Social History Narrative  . Not on file   Social Determinants of Health   Financial Resource Strain: Not on file  Food Insecurity: Not on file  Transportation Needs: Not on file  Physical Activity: Not on file  Stress: Not on file  Social Connections: Not on file    Allergies  Allergen Reactions  . Zofran [Ondansetron Hcl] Nausea And Vomiting, Other (See Comments) and Rash    Migraine headaches Migraine headaches and vomiting  . Oxycodone Itching and Rash  . Adhesive [Tape] Hives  . Clindamycin/Lincomycin Hives  . Hydrocodone-Acetaminophen Hives  . Iodine     duraprep-rash  . Duraprep [employer], Misc.] Hives and Rash  . Penicillins Hives, Nausea And Vomiting and Rash    vomiting     Current Outpatient Medications:  .  cetirizine (ZYRTEC) 10 MG tablet, as needed., Disp: , Rfl:  .  citalopram (CELEXA) 10 MG tablet, Take 10 mg by mouth as needed., Disp: , Rfl:  .  naproxen sodium (ANAPROX) 220 MG tablet, Take 220 mg by mouth daily as needed. , Disp: , Rfl:  .  amoxicillin (AMOXIL) 500 MG tablet, Take  1 tablet (500 mg total) by mouth in the morning, at noon, and at bedtime for 10 days., Disp: 30 tablet, Rfl: 4 .  OZEMPIC, 1 MG/DOSE, 4 MG/3ML SOPN, once a week. (Patient not taking: Reported on 11/26/2020), Disp: , Rfl:  .  rosuvastatin (CRESTOR) 5 MG tablet, Take 5 mg by mouth daily. (Patient not taking: Reported on 11/26/2020), Disp: , Rfl:     Review of Systems  Constitutional: Positive for chills, diaphoresis, fatigue and fever. Negative for activity change, appetite change and unexpected weight change.  HENT: Negative for congestion, rhinorrhea, sinus pressure, sneezing, sore throat and trouble swallowing.   Eyes: Negative for photophobia and visual disturbance.  Respiratory: Negative for cough, chest tightness, shortness of breath, wheezing and stridor.   Cardiovascular: Negative for chest pain, palpitations and leg swelling.  Gastrointestinal: Negative for abdominal distention, abdominal pain, anal bleeding, blood in stool, constipation, diarrhea, nausea and vomiting.  Genitourinary: Negative for difficulty urinating, dysuria, flank pain and hematuria.  Musculoskeletal: Negative for arthralgias, back pain, gait problem, joint swelling and myalgias.  Skin: Positive for color change. Negative for pallor, rash and wound.  Neurological: Negative for dizziness, tremors, weakness and light-headedness.  Hematological: Negative for adenopathy. Does not bruise/bleed easily.  Psychiatric/Behavioral: Negative for agitation, behavioral problems, confusion, decreased concentration,  dysphoric mood and sleep disturbance.       Objective:   Physical Exam Constitutional:      General: She is not in acute distress.    Appearance: Normal appearance. She is well-developed. She is not ill-appearing or diaphoretic.  HENT:     Head: Normocephalic and atraumatic.     Right Ear: Hearing and external ear normal.     Left Ear: Hearing and external ear normal.     Nose: No nasal deformity or rhinorrhea.  Eyes:     General: No scleral icterus.    Conjunctiva/sclera: Conjunctivae normal.     Right eye: Right conjunctiva is not injected.     Left eye: Left conjunctiva is not injected.     Pupils: Pupils are equal, round, and reactive to light.  Neck:     Vascular: No JVD.  Cardiovascular:     Rate and Rhythm: Normal rate and regular rhythm.     Heart sounds: Normal heart sounds, S1 normal and S2 normal. No murmur heard. No friction rub.  Abdominal:     General: Bowel sounds are normal. There is no distension.     Palpations: Abdomen is soft.     Tenderness: There is no abdominal tenderness.  Musculoskeletal:        General: Normal range of motion.     Right shoulder: Normal.     Left shoulder: Normal.     Cervical back: Normal range of motion and neck supple.     Right hip: Normal.     Left hip: Normal.     Right knee: Normal.     Left knee: Normal.  Lymphadenopathy:     Head:     Right side of head: No submandibular, preauricular or posterior auricular adenopathy.     Left side of head: No submandibular, preauricular or posterior auricular adenopathy.     Cervical: No cervical adenopathy.     Right cervical: No superficial or deep cervical adenopathy.    Left cervical: No superficial or deep cervical adenopathy.  Skin:    General: Skin is warm and dry.     Coloration: Skin is not pale.     Findings: Erythema present. No  abrasion, bruising, ecchymosis, lesion or rash.     Nails: There is no clubbing.  Neurological:     General: No focal deficit present.      Mental Status: She is alert and oriented to person, place, and time.     Sensory: No sensory deficit.     Coordination: Coordination normal.     Gait: Gait normal.  Psychiatric:        Attention and Perception: She is attentive.        Speech: Speech normal.        Behavior: Behavior normal. Behavior is cooperative.        Thought Content: Thought content normal.        Judgment: Judgment normal.     Right arm November 26, 2020:           Assessment & Plan:  Recurrent cellulitis in the context of lymphedema after axillary lymph node dissection:  She should complete the amoxicillin and then can resume prophylactic penicillin.  I have sent an additional prescription for amoxicillin so she can have treatment doses of amoxicillin around to initiate at first sign of cellulitis.  She is considering surgery at [facility] as mentioned.  Recent multiple urinary tract infections: Could be due to hormonal changes she does not have kidney stones.  Perhaps the penicillin she was taking was served some form of prophylaxis versus UTIs and she is having more now that she is not on antibiotics.  I spent greater than 30 minutes with the patient including greater than 50% of time in face to face counsel of the patient and in coordination of their care.

## 2020-11-26 NOTE — Patient Instructions (Signed)
Followup with Dr. Johnnye Sima in roughly a month

## 2020-12-02 ENCOUNTER — Telehealth: Payer: Self-pay

## 2020-12-02 ENCOUNTER — Encounter: Payer: Self-pay | Admitting: Internal Medicine

## 2020-12-02 DIAGNOSIS — I7300 Raynaud's syndrome without gangrene: Secondary | ICD-10-CM

## 2020-12-02 MED ORDER — AMLODIPINE BESYLATE 5 MG PO TABS
5.0000 mg | ORAL_TABLET | Freq: Every day | ORAL | 1 refills | Status: DC
Start: 2020-12-02 — End: 2021-03-04

## 2020-12-02 NOTE — Telephone Encounter (Signed)
She needs a follow up appointment scheduled for about 4 weeks from now since starting a new medicine.

## 2020-12-02 NOTE — Telephone Encounter (Signed)
Patient left a voicemail requesting a return call about starting the medication that was previously discussed with Dr. Benjamine Mola.  Patient states she is experiencing more symptoms.

## 2020-12-02 NOTE — Telephone Encounter (Signed)
Follow up scheduled for 12/30/2020.

## 2020-12-02 NOTE — Telephone Encounter (Signed)
Appears it was discussed with patient at [facility] on 09/24/2020 starting calcium channel blocker for the raynaud's symptoms if they worsened. Please advise.

## 2020-12-02 NOTE — Telephone Encounter (Signed)
I spoke with Jody Dunn her symptoms with finger discoloration and especially periods of numbness are now more frequent than before and she is noticing it more in the absence of any very cold exposures. I recommended trial of amlodipine 5 mg PO daily for raynaud's. Discussed possible side effects including leg swelling, low blood pressure, and headaches. We will follow up sooner than previously scheduled to check on symptoms and medication since starting a treatment.

## 2020-12-15 ENCOUNTER — Other Ambulatory Visit: Payer: Self-pay

## 2020-12-15 DIAGNOSIS — L03113 Cellulitis of right upper limb: Secondary | ICD-10-CM

## 2020-12-15 MED ORDER — AMOXICILLIN 500 MG PO TABS
500.0000 mg | ORAL_TABLET | Freq: Three times a day (TID) | ORAL | 5 refills | Status: AC
Start: 2020-12-15 — End: 2020-12-25

## 2020-12-15 NOTE — Progress Notes (Signed)
RN confirmed refill is appropriate with Dr. Tommy Medal.   Patient states she scratched her arm this weekend doing yard work and that the site is swollen. She will notify us if she develops any concerning symptoms such as fever, redness, or drainage. She has follow up appointment with Dr. Johnnye Sima on 12/30/20.   RN confirmed with patient that she has taken amoxicillin in the past without any complication.   Beryle Flock, RN

## 2020-12-15 NOTE — Telephone Encounter (Signed)
RN called [facility] to check on status of amoxicillin prescription. Per chart, it was phoned in on 11/26/20 as amoxicillin 500mg  tablet take 1 tablet by mouth in the morning, at noon, and at bedtime for 10 days dispense 30 with 4 refills.   [facility] does not have this on file. Will route to provider to make sure prescription is still appropriate.   Beryle Flock, RN

## 2020-12-30 ENCOUNTER — Ambulatory Visit: Payer: 59 | Admitting: Internal Medicine

## 2020-12-30 ENCOUNTER — Ambulatory Visit: Payer: 59 | Admitting: Infectious Diseases

## 2020-12-30 NOTE — Progress Notes (Deleted)
Office Visit Note  Patient: Jody Dunn             Date of Birth: June 30, 1976           MRN: [id number]             PCP: Merrilee Seashore, MD Referring: Merrilee Seashore, MD Visit Date: 12/30/2020   Subjective:  No chief complaint on file.   History of Present Illness: TEAL RABEN is a 45 y.o. female here for follow up ***     No Rheumatology ROS completed.   PMFS History:  Patient Active Problem List   Diagnosis Date Noted  . Arthralgia of both hands 08/27/2020  . Raynaud phenomenon 07/31/2020  . Recurrent cellulitis 04/29/2015  . Insomnia 02/06/2014  . Incisional hernia, without obstruction or gangrene 01/25/2014  . Lymphedema of arm 11/14/2013  . Hot flashes due to surgical menopause 09/18/2013  . Anxiety 09/18/2013  . S/P bilateral salpingo-oophorectomy 07/11/2013  . Breast cancer of upper-outer quadrant of right female breast ([place]) 03/12/2013  . S/P hysterectomy 11/13/2012  . Lactose intolerance   . Migraines   . Iron (Fe) deficiency anemia   . ALLERGIC RHINITIS, SEASONAL 12/15/2006    Past Medical History:  Diagnosis Date  . Allergy   . Anxiety 09/18/2013  . Asthma    exercise induced  . Back pain   . Breast cancer ([place]) 01/2012  . Cellulitis    Right arm, per patient  . Hyperlipidemia   . Iron (Fe) deficiency anemia   . Lactose intolerance   . Lymphedema    Right arm, per patient  . Migraines     Family History  Problem Relation Age of Onset  . Migraines Mother   . Chiari malformation Mother   . Cancer Father        prostate  . Diabetes Father   . Arthritis Father        Psoriatic Arthritis/Rheumatoid Arthritis- patient is unsure(???)  . Cancer Paternal Aunt 61       Breast cancer (half sibling, related through father)  . Spina bifida Paternal Uncle 0       died around 73-30 months of age  . Cancer Maternal Grandmother 71       colon cancer  . Dementia Maternal Grandfather   . Cancer Paternal Grandmother        breast cancer  between 54-42  . Cancer Paternal Grandfather        brain cancer  . Cancer Other        Paternal grandmother's siblings had breast cancer and pancreatic cancer  . Cancer Other 34       maternal great grandmother with breast cancer   Past Surgical History:  Procedure Laterality Date  . ABDOMINAL HYSTERECTOMY    . APPENDECTOMY    . BREAST BIOPSY  02/09/2012  . CHEILECTOMY Right 2017   Per patient, right great toe  . KNEE SURGERY     X2  . MASTECTOMY MODIFIED RADICAL  06/28/2012   Procedure: MASTECTOMY MODIFIED RADICAL;  Surgeon: Stark Klein, MD;  Location: [facility];  Service: General;  Laterality: Right;  Right Modified Radical Mastectomy and Left Simple Mastectomy  . PORTACATH PLACEMENT  02/21/2012   Procedure: INSERTION PORT-A-CATH;  Surgeon: Stark Klein, MD;  Location: [facility];  Service: General;  Laterality: N/A;  . ROBOTIC ASSISTED TOTAL HYSTERECTOMY WITH BILATERAL SALPINGO OOPHERECTOMY N/A 11/13/2012   Procedure: ROBOTIC ASSISTED TOTAL HYSTERECTOMY WITH BILATERAL SALPINGO OOPHORECTOMY;  Surgeon: Tana Conch  Ronita Hipps, MD;  Location: [facility];  Service: Gynecology;  Laterality: N/A;  . SHOULDER SURGERY    . SIMPLE MASTECTOMY WITH AXILLARY SENTINEL NODE BIOPSY  06/28/2012   Procedure: SIMPLE MASTECTOMY;  Surgeon: Stark Klein, MD;  Location: [facility];  Service: General;  Laterality: Left;  . SKIN BIOPSY  02/21/2012   Procedure: BIOPSY SKIN;  Surgeon: Stark Klein, MD;  Location: [facility];  Service: General;  Laterality: Right;   Social History   Social History Narrative  . Not on file   Immunization History  Administered Date(s) Administered  . Influenza,inj,Quad PF,6-35 Mos 06/28/2018, 06/19/2020  . Influenza,trivalent, recombinat, inj, PF 05/02/2019  . PFIZER(Purple Top)SARS-COV-2 Vaccination 10/23/2019, 11/20/2019, 08/12/2020  . PPD Test 03/30/2019  . Tdap 03/02/2011, 10/26/2018     Objective: Vital Signs: LMP 10/24/2012    Physical Exam   Musculoskeletal Exam: ***  CDAI  Exam: CDAI Score: -- Patient Global: --; Provider Global: -- Swollen: --; Tender: -- Joint Exam 12/30/2020   No joint exam has been documented for this visit   There is currently no information documented on the homunculus. Go to the Rheumatology activity and complete the homunculus joint exam.  Investigation: No additional findings.  Imaging: No results found.  Recent Labs: Lab Results  Component Value Date   WBC 6.7 05/27/2016   HGB 13.7 05/27/2016   PLT 271 05/27/2016   NA 139 05/27/2016   K 3.3 (L) 05/27/2016   CL 102 05/09/2014   CO2 25 05/27/2016   GLUCOSE 119 05/27/2016   BUN 12.6 05/27/2016   CREATININE 1.0 05/27/2016   BILITOT 0.47 05/27/2016   ALKPHOS 111 05/27/2016   AST 36 (H) 05/27/2016   ALT 45 05/27/2016   PROT 7.9 05/27/2016   ALBUMIN 4.0 05/27/2016   CALCIUM 9.3 05/27/2016   GFRAA 90 (L) 11/10/2012    Speciality Comments: No specialty comments available.  Procedures:  No procedures performed Allergies: Zofran [ondansetron hcl]; Oxycodone; Adhesive [tape]; Clindamycin/lincomycin; Hydrocodone-acetaminophen; Iodine; Duraprep [antiseptic products, misc.]; and Penicillins   Assessment / Plan:     Visit Diagnoses: No diagnosis found.  ***  Orders: No orders of the defined types were placed in this encounter.  No orders of the defined types were placed in this encounter.    Follow-Up Instructions: No follow-ups on file.   Collier Salina, MD  Note - This record has been created using [employer].  Chart creation errors have been sought, but may not always  have been located. Such creation errors do not reflect on  the standard of medical care.

## 2021-01-06 ENCOUNTER — Telehealth: Payer: Self-pay

## 2021-01-06 ENCOUNTER — Ambulatory Visit: Payer: 59 | Admitting: Infectious Diseases

## 2021-01-06 NOTE — Telephone Encounter (Signed)
Attempted to call patient at 4:30 regarding missed 4:15 appt. Left voicemail requesting patient call office back and reschedule.  Leatrice Jewels, RMA

## 2021-01-26 ENCOUNTER — Encounter: Payer: Self-pay | Admitting: Nurse Practitioner

## 2021-03-04 ENCOUNTER — Other Ambulatory Visit: Payer: Self-pay | Admitting: Internal Medicine

## 2021-03-04 DIAGNOSIS — I7300 Raynaud's syndrome without gangrene: Secondary | ICD-10-CM

## 2021-03-17 ENCOUNTER — Encounter: Payer: Self-pay | Admitting: Nurse Practitioner

## 2021-03-24 ENCOUNTER — Ambulatory Visit: Payer: 59 | Admitting: Internal Medicine

## 2021-03-24 NOTE — Progress Notes (Deleted)
Office Visit Note  Patient: Jody Dunn             Date of Birth: 03/02/1976           MRN: [id number]             PCP: Merrilee Seashore, MD Referring: Merrilee Seashore, MD Visit Date: 03/24/2021   Subjective:  No chief complaint on file.   History of Present Illness: Jody Dunn is a 45 y.o. female here for follow up for atypical raynaud's symptoms we prescribed amlodipine 5 mg PO daily for some increase in symptoms following our last visit..***   Previous HPI: 09/24/20 Jody Dunn is a 45 y.o. female with a history of breast cancer s/p bilateral mastectomies, chemotherapy, xrt, bilateral salpingo-oophorectomy, and tissue reconstruction here for follow up of raynaud's phenomenon. At last visit autoantibody profile testing was unremarkable. She was recommended to follow up for repeat exam and discuss options to treat raynaud's symptoms. Since that visit she did have an episode of taking penicillin again otherwise no medical events. She thinks episodes are 3-4 times weekly. When indoors and unprovoked often has just the paresthesias without discoloration. When outside with cold and discoloration can take up to an hour to fully resolve but no residual changes. Since last visit no major events or changes does continue to experience symptoms.  08/27/20 Jody Dunn is a 46 y.o. female  She has a history of breast cancer s/p bilateral mastectomies, chemotherapy, xrt, bilateral salpingo-oophorectomy, and tissue reconstruction here for evaluation of raynaud's phenomenon. 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 These are not provoked with cold temperature but more provoked with stress reaction. Episodes are usually brief but rarely have lasted up to 45 minutes. She has no ulcers, sores, or nail pitting associated with this. She does not  use any particular treatment besides hand warming.   She also notices hand pain worst around the 2nd-3rd MCP joints but also affecting some of the PIP joints on both hands. She is not sure whether these also swell, as she has baseline upper extremity lymphedema that frequently causes some swelling. She has flat, erythematous, photosensitive and heat sensitive rashes on the face and upper body. She has dry eyes and mouth but no ulcers, sores, or major problems with dentition. She gets sharp chest pain intermittently and has had multiple chest rays for this without any findings. She does not notice lymphadenopathy and denies any history of known blood clots.   She has had previous right 1st MTP cheilectomy no other major joint surgeries. She has seen oncology service with [facility] then at [facility]. She also has subsequent lymphedema and has suffered multiple episodes of severe cellulitis treated with ID clinic.    No Rheumatology ROS completed.   PMFS History:  Patient Active Problem List   Diagnosis Date Noted   Arthralgia of both hands 08/27/2020   Raynaud phenomenon 07/31/2020   Recurrent cellulitis 04/29/2015   Insomnia 02/06/2014   Incisional hernia, without obstruction or gangrene 01/25/2014   Lymphedema of arm 11/14/2013   Hot flashes due to surgical menopause 09/18/2013   Anxiety 09/18/2013   S/P bilateral salpingo-oophorectomy 07/11/2013   Breast cancer of upper-outer quadrant of right female breast ([place]) 03/12/2013   S/P hysterectomy 11/13/2012   Lactose intolerance    Migraines    Iron (  Fe) deficiency anemia    ALLERGIC RHINITIS, SEASONAL 12/15/2006    Past Medical History:  Diagnosis Date   Allergy    Anxiety 09/18/2013   Asthma    exercise induced   Back pain    Breast cancer ([place]) 01/2012   Cellulitis    Right arm, per patient   Hyperlipidemia    Iron (Fe) deficiency anemia    Lactose intolerance    Lymphedema    Right arm, per patient   Migraines     Family  History  Problem Relation Age of Onset   Migraines Mother    Chiari malformation Mother    Cancer Father        prostate   Diabetes Father    Arthritis Father        Psoriatic Arthritis/Rheumatoid Arthritis- patient is unsure(???)   Cancer Paternal Aunt 68       Breast cancer (half sibling, related through father)   Spina bifida Paternal Uncle 0       died around 51-49 months of age   30 Maternal Grandmother 82       colon cancer   Dementia Maternal Grandfather    Cancer Paternal Grandmother        breast cancer between 37-42   Cancer Paternal Grandfather        brain cancer   Cancer Other        Paternal grandmother's siblings had breast cancer and pancreatic cancer   Cancer Other 19       maternal great grandmother with breast cancer   Past Surgical History:  Procedure Laterality Date   ABDOMINAL HYSTERECTOMY     APPENDECTOMY     BREAST BIOPSY  02/09/2012   CHEILECTOMY Right 2017   Per patient, right great toe   KNEE SURGERY     X2   MASTECTOMY MODIFIED RADICAL  06/28/2012   Procedure: MASTECTOMY MODIFIED RADICAL;  Surgeon: Stark Klein, MD;  [facility]: [facility];  Service: General;  Laterality: Right;  Right Modified Radical Mastectomy and Left Simple Mastectomy   PORTACATH PLACEMENT  02/21/2012   Procedure: INSERTION PORT-A-CATH;  Surgeon: Stark Klein, MD;  [facility];  Service: General;  Laterality: N/A;   ROBOTIC ASSISTED TOTAL HYSTERECTOMY WITH BILATERAL SALPINGO OOPHERECTOMY N/A 11/13/2012   Procedure: ROBOTIC ASSISTED TOTAL HYSTERECTOMY WITH BILATERAL SALPINGO OOPHORECTOMY;  Surgeon: Lovenia Kim, MD;  Location: [facility];  Service: Gynecology;  Laterality: N/A;   SHOULDER SURGERY     SIMPLE MASTECTOMY WITH AXILLARY SENTINEL NODE BIOPSY  06/28/2012   Procedure: SIMPLE MASTECTOMY;  Surgeon: Stark Klein, MD;  [facility];  Service: General;  Laterality: Left;   SKIN BIOPSY  02/21/2012   Procedure: BIOPSY SKIN;  Surgeon: Stark Klein, MD;  Location: [facility];   Service: General;  Laterality: Right;   Social History   Social History Narrative   Not on file   Immunization History  Administered Date(s) Administered   Influenza,inj,Quad PF,6-35 Mos 06/28/2018, 06/19/2020   Influenza,trivalent, recombinat, inj, PF 05/02/2019   PFIZER(Purple Top)SARS-COV-2 Vaccination 10/23/2019, 11/20/2019, 08/12/2020   PPD Test 03/30/2019   Tdap 03/02/2011, 10/26/2018     Objective: Vital Signs: LMP 10/24/2012    Physical Exam   Musculoskeletal Exam: ***  CDAI Exam: CDAI Score: -- Patient Global: --; Provider Global: -- Swollen: --; Tender: -- Joint Exam 03/24/2021   No joint exam has been documented for this visit   There is currently no information documented on the homunculus. Go to the Rheumatology activity  and complete the homunculus joint exam.  Investigation: No additional findings.  Imaging: No results found.  Recent Labs: Lab Results  Component Value Date   WBC 6.7 05/27/2016   HGB 13.7 05/27/2016   PLT 271 05/27/2016   NA 139 05/27/2016   K 3.3 (L) 05/27/2016   CL 102 05/09/2014   CO2 25 05/27/2016   GLUCOSE 119 05/27/2016   BUN 12.6 05/27/2016   CREATININE 1.0 05/27/2016   BILITOT 0.47 05/27/2016   ALKPHOS 111 05/27/2016   AST 36 (H) 05/27/2016   ALT 45 05/27/2016   PROT 7.9 05/27/2016   ALBUMIN 4.0 05/27/2016   CALCIUM 9.3 05/27/2016   GFRAA 90 (L) 11/10/2012    Speciality Comments: No specialty comments available.  Procedures:  No procedures performed Allergies: Zofran [ondansetron hcl]; Oxycodone; Adhesive [tape]; Clindamycin/lincomycin; Hydrocodone-acetaminophen; Iodine; Duraprep [antiseptic products, misc.]; and Penicillins   Assessment / Plan:     Visit Diagnoses: No diagnosis found.  ***  Orders: No orders of the defined types were placed in this encounter.  No orders of the defined types were placed in this encounter.    Follow-Up Instructions: No follow-ups on file.   Collier Salina,  MD  Note - This record has been created using [employer].  Chart creation errors have been sought, but may not always  have been located. Such creation errors do not reflect on  the standard of medical care.

## 2021-04-23 ENCOUNTER — Telehealth: Payer: Self-pay

## 2021-04-23 NOTE — Telephone Encounter (Signed)
Patient left voicemail stating she has noticed her cellulitis flaring up. She started taking her antibiotic starting this morning at 3 am and has taken 3 doses so far. She is concerned that it is continuing to worsen throughout the day. She accepts an appointment with Dr. Tommy Medal 04/24/21.  Beryle Flock, RN

## 2021-04-24 ENCOUNTER — Ambulatory Visit (INDEPENDENT_AMBULATORY_CARE_PROVIDER_SITE_OTHER): Payer: 59 | Admitting: Infectious Disease

## 2021-04-24 ENCOUNTER — Other Ambulatory Visit: Payer: Self-pay

## 2021-04-24 ENCOUNTER — Encounter: Payer: Self-pay | Admitting: Infectious Disease

## 2021-04-24 VITALS — BP 130/89 | HR 80 | Temp 98.2°F | Wt 178.0 lb

## 2021-04-24 DIAGNOSIS — I890 Lymphedema, not elsewhere classified: Secondary | ICD-10-CM | POA: Diagnosis not present

## 2021-04-24 DIAGNOSIS — C50411 Malignant neoplasm of upper-outer quadrant of right female breast: Secondary | ICD-10-CM | POA: Diagnosis not present

## 2021-04-24 DIAGNOSIS — L0390 Cellulitis, unspecified: Secondary | ICD-10-CM

## 2021-04-24 DIAGNOSIS — Z170 Estrogen receptor positive status [ER+]: Secondary | ICD-10-CM

## 2021-04-24 MED ORDER — AMOXICILLIN 500 MG PO TABS: 500.0000 mg | ORAL_TABLET | Freq: Three times a day (TID) | ORAL | 11 refills | Status: DC

## 2021-04-24 NOTE — Progress Notes (Signed)
Subjective:  Chief complaint abrupt onset of fevers chills and cellulitis involving her right foream   Patient ID: Jody Dunn, female    DOB: 01-06-1976, 45 y.o.   MRN: [id number]  HPI  Jody Dunn is a 45 year-old Caucasian female with a history of breast cancer status post bilateral mastectomies lymph node resections chemotherapy radiation therapy reconstructive surgery who has had problems with chronic right lymphedema and recurrent cellulitis in the affected arm.  She has been followed by my partner Dr. Johnnye Sima and had been on prophylactic penicillin until August 2021 when she decided to see how she would do off of the antibiotics.  She saw Dr. Johnnye Sima in December and was doing well.  She felt that she was having less frequent episodes of cellulitis now despite being off antibiotics in part due to weight loss.  She has been formally diagnosed with Raynaud's and is managing that the best she can.  She had established care with a surgeon in [facility] who can perform surgery to treat lymphedema which involves what sounds like transplantation of lymphoid tissue from the leg to the affected arm.  I saw her in April when she had a cute onset of cellulitis of her right upper extremity that did again respond to amoxicillin.  Today she presented to clinic acutely after having symptoms at 2 AM on Thursday morning of acute onset of fever rigors chills and pain in her arm.  She then had progression to erythema and worsening edema in her forearm she also had facial flushing as well as erythema across her chest.  She immediate began taking amoxicillin and then the interim has had improvement in her fevers she still had a fever yesterday erythema is diminishing.  She is getting ready go on a trip to the beach and wanted to make sure that appropriate therapy.    Past Medical History:  Diagnosis Date   Allergy    Anxiety 09/18/2013   Asthma    exercise induced   Back pain    Breast cancer ([place])  01/2012   Cellulitis    Right arm, per patient   Hyperlipidemia    Iron (Fe) deficiency anemia    Lactose intolerance    Lymphedema    Right arm, per patient   Migraines     Past Surgical History:  Procedure Laterality Date   ABDOMINAL HYSTERECTOMY     APPENDECTOMY     BREAST BIOPSY  02/09/2012   CHEILECTOMY Right 2017   Per patient, right great toe   KNEE SURGERY     X2   MASTECTOMY MODIFIED RADICAL  06/28/2012   Procedure: MASTECTOMY MODIFIED RADICAL;  Surgeon: Stark Klein, MD;  Location: [facility];  Service: General;  Laterality: Right;  Right Modified Radical Mastectomy and Left Simple Mastectomy   PORTACATH PLACEMENT  02/21/2012   Procedure: INSERTION PORT-A-CATH;  Surgeon: Stark Klein, MD;  [facility]: [facility];  Service: General;  Laterality: N/A;   ROBOTIC ASSISTED TOTAL HYSTERECTOMY WITH BILATERAL SALPINGO OOPHERECTOMY N/A 11/13/2012   Procedure: ROBOTIC ASSISTED TOTAL HYSTERECTOMY WITH BILATERAL SALPINGO OOPHORECTOMY;  Surgeon: Lovenia Kim, MD;  Location: [facility];  Service: Gynecology;  Laterality: N/A;   SHOULDER SURGERY     SIMPLE MASTECTOMY WITH AXILLARY SENTINEL NODE BIOPSY  06/28/2012   Procedure: SIMPLE MASTECTOMY;  Surgeon: Stark Klein, MD;  [facility];  Service: General;  Laterality: Left;   SKIN BIOPSY  02/21/2012   Procedure: BIOPSY SKIN;  Surgeon: Stark Klein, MD;  Location: [facility];  Service: General;  Laterality: Right;    Family History  Problem Relation Age of Onset   Migraines Mother    Chiari malformation Mother    Cancer Father        prostate   Diabetes Father    Arthritis Father        Psoriatic Arthritis/Rheumatoid Arthritis- patient is unsure(???)   Cancer Paternal Aunt 49       Breast cancer (half sibling, related through father)   Spina bifida Paternal Uncle 0       died around 59-68 months of age   45 Maternal Grandmother 56       colon cancer   Dementia Maternal Grandfather    Cancer Paternal Grandmother        breast cancer  between 10-42   Cancer Paternal Grandfather        brain cancer   Cancer Other        Paternal grandmother's siblings had breast cancer and pancreatic cancer   Cancer Other 27       maternal great grandmother with breast cancer      Social History   Socioeconomic History   Marital status: Married    Spouse name: Not on file   Number of children: Not on file   Years of education: Not on file   Highest education level: Not on file  Occupational History   Not on file  Tobacco Use   Smoking status: Never   Smokeless tobacco: Never  Vaping Use   Vaping Use: Never used  Substance and Sexual Activity   Alcohol use: Yes    Alcohol/week: 5.0 - 7.0 standard drinks    Types: 5 - 7 Standard drinks or equivalent per week    Comment: OCCASIONAL   Drug use: No   Sexual activity: Yes    Birth control/protection: Surgical  Other Topics Concern   Not on file  Social History Narrative   Not on file   Social Determinants of Health   Financial Resource Strain: Not on file  Food Insecurity: Not on file  Transportation Needs: Not on file  Physical Activity: Not on file  Stress: Not on file  Social Connections: Not on file    Allergies  Allergen Reactions   Zofran [Ondansetron Hcl] Nausea And Vomiting, Other (See Comments) and Rash    Migraine headaches Migraine headaches and vomiting   Oxycodone Itching and Rash   Adhesive [Tape] Hives   Clindamycin/Lincomycin Hives   Hydrocodone-Acetaminophen Hives   Iodine     duraprep-rash   Duraprep [Antiseptic Products, Misc.] Hives and Rash   Penicillins Hives, Nausea And Vomiting and Rash    vomiting     Current Outpatient Medications:    amoxicillin (AMOXIL) 500 MG tablet, Take 500 mg by mouth 3 (three) times daily., Disp: , Rfl:    amLODipine (NORVASC) 5 MG tablet, TAKE 1 TABLET(5 MG) BY MOUTH DAILY (Patient not taking: Reported on 04/24/2021), Disp: 30 tablet, Rfl: 1   cetirizine (ZYRTEC) 10 MG tablet, as needed. (Patient not  taking: Reported on 04/24/2021), Disp: , Rfl:    citalopram (CELEXA) 10 MG tablet, Take 10 mg by mouth as needed. (Patient not taking: Reported on 04/24/2021), Disp: , Rfl:    naproxen sodium (ANAPROX) 220 MG tablet, Take 220 mg by mouth daily as needed.  (Patient not taking: Reported on 04/24/2021), Disp: , Rfl:    OZEMPIC, 1 MG/DOSE, 4 MG/3ML SOPN, once a week. (Patient not taking: No sig reported),  Disp: , Rfl:    rosuvastatin (CRESTOR) 5 MG tablet, Take 5 mg by mouth daily. (Patient not taking: No sig reported), Disp: , Rfl:     Review of Systems  Constitutional:  Positive for chills, diaphoresis, fatigue and fever. Negative for activity change, appetite change and unexpected weight change.  HENT:  Negative for congestion, rhinorrhea, sinus pressure, sneezing, sore throat and trouble swallowing.   Eyes:  Negative for photophobia and visual disturbance.  Respiratory:  Negative for cough, chest tightness, shortness of breath, wheezing and stridor.   Cardiovascular:  Negative for chest pain, palpitations and leg swelling.  Gastrointestinal:  Negative for abdominal distention, abdominal pain, anal bleeding, blood in stool, constipation, diarrhea, nausea and vomiting.  Genitourinary:  Negative for difficulty urinating, dysuria, flank pain and hematuria.  Musculoskeletal:  Positive for arthralgias. Negative for back pain, gait problem, joint swelling and myalgias.  Skin:  Positive for color change. Negative for pallor, rash and wound.  Neurological:  Negative for dizziness, tremors, weakness and light-headedness.  Hematological:  Negative for adenopathy. Does not bruise/bleed easily.  Psychiatric/Behavioral:  Positive for sleep disturbance. Negative for agitation, behavioral problems, confusion, decreased concentration and dysphoric mood.       Objective:   Physical Exam Constitutional:      General: She is not in acute distress.    Appearance: Normal appearance. She is well-developed. She is not  ill-appearing or diaphoretic.  HENT:     Head: Normocephalic and atraumatic.     Right Ear: Hearing and external ear normal.     Left Ear: Hearing and external ear normal.     Nose: No nasal deformity or rhinorrhea.  Eyes:     General: No scleral icterus.    Conjunctiva/sclera: Conjunctivae normal.     Right eye: Right conjunctiva is not injected.     Left eye: Left conjunctiva is not injected.     Pupils: Pupils are equal, round, and reactive to light.  Neck:     Vascular: No JVD.  Cardiovascular:     Rate and Rhythm: Normal rate and regular rhythm.     Heart sounds: Normal heart sounds, S1 normal and S2 normal. No murmur heard.   No friction rub.  Abdominal:     General: Bowel sounds are normal. There is no distension.     Palpations: Abdomen is soft.     Tenderness: There is no abdominal tenderness.  Musculoskeletal:        General: Normal range of motion.     Right shoulder: Normal.     Left shoulder: Normal.     Cervical back: Normal range of motion and neck supple.     Right hip: Normal.     Left hip: Normal.     Right knee: Normal.     Left knee: Normal.  Lymphadenopathy:     Head:     Right side of head: No submandibular, preauricular or posterior auricular adenopathy.     Left side of head: No submandibular, preauricular or posterior auricular adenopathy.     Cervical: No cervical adenopathy.     Right cervical: No superficial or deep cervical adenopathy.    Left cervical: No superficial or deep cervical adenopathy.  Skin:    General: Skin is warm and dry.     Coloration: Skin is not pale.     Findings: Erythema present. No abrasion, bruising, ecchymosis, lesion or rash.     Nails: There is no clubbing.  Neurological:     Mental  Status: She is alert and oriented to person, place, and time.     Sensory: No sensory deficit.     Coordination: Coordination normal.     Gait: Gait normal.  Psychiatric:        Attention and Perception: She is attentive.        Mood  and Affect: Mood normal.        Speech: Speech normal.        Behavior: Behavior normal. Behavior is cooperative.        Thought Content: Thought content normal.        Judgment: Judgment normal.    Right arm November 26, 2020:      Right arm today 04/24/2021:        Assessment & Plan:  Recurrent cellulitis in the context of lymphedema: Episode appears more severe than the last more systemic symptoms and more pain. she is very well attuned to the abrupt onset of this condition and her.    Complete amoxicillin course I have sent in new prescriptions  Consider changing from propylactic PCN to using amoxicillin instead for prophylaxis given its superior availability.  Considering surgery at [facility].  Raynaud's phenomena stable

## 2021-04-28 ENCOUNTER — Telehealth: Payer: Self-pay

## 2021-04-28 NOTE — Telephone Encounter (Signed)
Spoke with patient, advised her Dr. Tommy Medal would like her to take the amoxicillin 3 times a day for 10 days and then switch to only twice a day. She verbalized understanding, but is wondering how long she will be taking the amoxicillin twice a day as she does not have follow up scheduled. Will route to provider.   Beryle Flock, RN

## 2021-04-28 NOTE — Telephone Encounter (Signed)
-----   Message from Truman Hayward, MD sent at 04/24/2021  5:57 PM EDT ----- Can we check if she has still been using prophylactic PCN if so we maybe should consider having her on amoxicillin all the time instead it is more bio available

## 2021-04-28 NOTE — Telephone Encounter (Signed)
Spoke with patient, states she has not been using the penicillin. She's only been taking amoxicillin.   Beryle Flock, RN

## 2021-04-29 NOTE — Telephone Encounter (Signed)
Left HIPAA compliant voicemail requesting callback to schedule follow up.   Beryle Flock, RN

## 2021-04-30 ENCOUNTER — Telehealth: Payer: Self-pay

## 2021-04-30 NOTE — Telephone Encounter (Signed)
Left patient a voice mail, needs to schedule 2 month Follow up with Dr. Tommy Medal

## 2021-05-04 NOTE — Telephone Encounter (Signed)
Patient scheduled for follow up 10/10 with Dr. Tommy Medal. Patient wants to know if she'll be taking amoxicillin until this appointment (BID amoxicillin)  Carlean Purl, RN

## 2021-05-25 ENCOUNTER — Encounter: Payer: Self-pay | Admitting: Nurse Practitioner

## 2021-06-01 ENCOUNTER — Ambulatory Visit: Payer: 59 | Admitting: Infectious Disease

## 2021-06-12 ENCOUNTER — Other Ambulatory Visit: Payer: Self-pay

## 2021-06-12 ENCOUNTER — Ambulatory Visit (INDEPENDENT_AMBULATORY_CARE_PROVIDER_SITE_OTHER): Payer: BC Managed Care – PPO | Admitting: Infectious Disease

## 2021-06-12 ENCOUNTER — Encounter: Payer: Self-pay | Admitting: Infectious Disease

## 2021-06-12 ENCOUNTER — Encounter: Payer: Self-pay | Admitting: Nurse Practitioner

## 2021-06-12 VITALS — BP 127/85 | HR 97 | Temp 98.4°F | Wt 180.0 lb

## 2021-06-12 DIAGNOSIS — I7300 Raynaud's syndrome without gangrene: Secondary | ICD-10-CM

## 2021-06-12 DIAGNOSIS — Z170 Estrogen receptor positive status [ER+]: Secondary | ICD-10-CM

## 2021-06-12 DIAGNOSIS — C50411 Malignant neoplasm of upper-outer quadrant of right female breast: Secondary | ICD-10-CM | POA: Diagnosis not present

## 2021-06-12 DIAGNOSIS — L0390 Cellulitis, unspecified: Secondary | ICD-10-CM | POA: Diagnosis not present

## 2021-06-12 MED ORDER — AMOXICILLIN 500 MG PO TABS: 500.0000 mg | ORAL_TABLET | Freq: Two times a day (BID) | ORAL | 11 refills | Status: DC

## 2021-06-12 NOTE — Progress Notes (Signed)
Subjective:  Chief complaint follow-up for recurrent cellulitis   Patient ID: Jody Dunn, female    DOB: Aug 04, 1976, 45 y.o.   MRN: [id number]  HPI  Jody Dunn is a 45 year-old Caucasian female with a history of breast cancer status post bilateral mastectomies lymph node resections chemotherapy radiation therapy reconstructive surgery who has had problems with chronic right lymphedema and recurrent cellulitis in the affected arm.  She has been followed by my partner Dr. Johnnye Sima and had been on prophylactic penicillin until August 2021 when she decided to see how she would do off of the antibiotics.  She saw Dr. Johnnye Sima in December and was doing well.  She felt that she was having less frequent episodes of cellulitis now despite being off antibiotics in part due to weight loss.  She has been formally diagnosed with Raynaud's and is managing that the best she can.  She had established care with a surgeon in [facility] who can perform surgery to treat lymphedema which involves what sounds like transplantation of lymphoid tissue from the leg to the affected arm.  I saw her in April when she had a cute onset of cellulitis of her right upper extremity that did again respond to amoxicillin.  Saw her in September when she presented to clinic acutely after having symptoms at 2 AM on Thursday morning prior to visit with of acute onset of fever rigors chills and pain in her arm.  She then had progression to erythema and worsening edema in her forearm she also had facial flushing as well as erythema across her chest.  She immediate began taking amoxicillin and then the interim has had improvement in her fevers she still had a fever yesterday erythema is diminishing.  She was getting ready go on a trip to the beach and wanted to make sure that appropriate therapy.  She did go on the beach trip but unfortunately was abruptly cut short by several unfortunate events apparently there was a wasp nest  discovered by her dogs while she was gone and not all of the dogs but also the dog walker who is allergic to the wasp was bitten then her mother-in-law was also scratched by the dog's resulting in some bruising.  Mother-in-law is also on blood thinners.  Jody Dunn had been taking amoxicillin and tried to take it twice a day but had trouble tolerating this.  She stopped it altogether and then made an appointment with me she has not resumed oral penicillin yet.    Past Medical History:  Diagnosis Date   Allergy    Anxiety 09/18/2013   Asthma    exercise induced   Back pain    Breast cancer ([place]) 01/2012   Cellulitis    Right arm, per patient   Hyperlipidemia    Iron (Fe) deficiency anemia    Lactose intolerance    Lymphedema    Right arm, per patient   Migraines     Past Surgical History:  Procedure Laterality Date   ABDOMINAL HYSTERECTOMY     APPENDECTOMY     BREAST BIOPSY  02/09/2012   CHEILECTOMY Right 2017   Per patient, right great toe   KNEE SURGERY     X2   MASTECTOMY MODIFIED RADICAL  06/28/2012   Procedure: MASTECTOMY MODIFIED RADICAL;  Surgeon: Stark Klein, MD;  [facility];  Service: General;  Laterality: Right;  Right Modified Radical Mastectomy and Left Simple Mastectomy   PORTACATH PLACEMENT  02/21/2012   Procedure: INSERTION PORT-A-CATH;  Surgeon: Stark Klein, MD;  [facility];  Service: General;  Laterality: N/A;   ROBOTIC ASSISTED TOTAL HYSTERECTOMY WITH BILATERAL SALPINGO OOPHERECTOMY N/A 11/13/2012   Procedure: ROBOTIC ASSISTED TOTAL HYSTERECTOMY WITH BILATERAL SALPINGO OOPHORECTOMY;  Surgeon: Lovenia Kim, MD;  Location: [facility];  Service: Gynecology;  Laterality: N/A;   SHOULDER SURGERY     SIMPLE MASTECTOMY WITH AXILLARY SENTINEL NODE BIOPSY  06/28/2012   Procedure: SIMPLE MASTECTOMY;  Surgeon: Stark Klein, MD;  [facility];  Service: General;  Laterality: Left;   SKIN BIOPSY  02/21/2012   Procedure: BIOPSY SKIN;  Surgeon: Stark Klein, MD;   [facility];  Service: General;  Laterality: Right;    Family History  Problem Relation Age of Onset   Migraines Mother    Chiari malformation Mother    Cancer Father        prostate   Diabetes Father    Arthritis Father        Psoriatic Arthritis/Rheumatoid Arthritis- patient is unsure(???)   Cancer Paternal Aunt 79       Breast cancer (half sibling, related through father)   Spina bifida Paternal Uncle 0       died around 17-68 months of age   18 Maternal Grandmother 32       colon cancer   Dementia Maternal Grandfather    Cancer Paternal Grandmother        breast cancer between 53-42   Cancer Paternal Grandfather        brain cancer   Cancer Other        Paternal grandmother's siblings had breast cancer and pancreatic cancer   Cancer Other 38       maternal great grandmother with breast cancer      Social History   Socioeconomic History   Marital status: Married    Spouse name: Not on file   Number of children: Not on file   Years of education: Not on file   Highest education level: Not on file  Occupational History   Not on file  Tobacco Use   Smoking status: Never   Smokeless tobacco: Never  Vaping Use   Vaping Use: Never used  Substance and Sexual Activity   Alcohol use: Yes    Alcohol/week: 5.0 - 7.0 standard drinks    Types: 5 - 7 Standard drinks or equivalent per week    Comment: OCCASIONAL   Drug use: No   Sexual activity: Yes    Birth control/protection: Surgical  Other Topics Concern   Not on file  Social History Narrative   Not on file   Social Determinants of Health   Financial Resource Strain: Not on file  Food Insecurity: Not on file  Transportation Needs: Not on file  Physical Activity: Not on file  Stress: Not on file  Social Connections: Not on file    Allergies  Allergen Reactions   Zofran [Ondansetron Hcl] Nausea And Vomiting, Other (See Comments) and Rash    Migraine headaches Migraine headaches and vomiting    Oxycodone Itching and Rash   Adhesive [Tape] Hives   Clindamycin/Lincomycin Hives   Hydrocodone-Acetaminophen Hives   Iodine     duraprep-rash   Duraprep [Antiseptic Products, Misc.] Hives and Rash     Current Outpatient Medications:    amLODipine (NORVASC) 5 MG tablet, TAKE 1 TABLET(5 MG) BY MOUTH DAILY (Patient not taking: No sig reported), Disp: 30 tablet, Rfl: 1   amoxicillin (AMOXIL) 500 MG tablet, Take 1 tablet (500  mg total) by mouth 3 (three) times daily. (Patient not taking: Reported on 06/12/2021), Disp: 30 tablet, Rfl: 11   cetirizine (ZYRTEC) 10 MG tablet, as needed. (Patient not taking: No sig reported), Disp: , Rfl:    citalopram (CELEXA) 10 MG tablet, Take 10 mg by mouth as needed. (Patient not taking: No sig reported), Disp: , Rfl:    naproxen sodium (ANAPROX) 220 MG tablet, Take 220 mg by mouth daily as needed.  (Patient not taking: No sig reported), Disp: , Rfl:    OZEMPIC, 1 MG/DOSE, 4 MG/3ML SOPN, once a week. (Patient not taking: No sig reported), Disp: , Rfl:    rosuvastatin (CRESTOR) 5 MG tablet, Take 5 mg by mouth daily. (Patient not taking: No sig reported), Disp: , Rfl:     Review of Systems  Constitutional:  Negative for activity change, appetite change, chills, diaphoresis, fatigue, fever and unexpected weight change.  HENT:  Negative for congestion, rhinorrhea, sinus pressure, sneezing, sore throat and trouble swallowing.   Eyes:  Negative for photophobia and visual disturbance.  Respiratory:  Negative for cough, chest tightness, shortness of breath, wheezing and stridor.   Cardiovascular:  Negative for chest pain, palpitations and leg swelling.  Gastrointestinal:  Negative for abdominal distention, abdominal pain, anal bleeding, blood in stool, constipation, diarrhea, nausea and vomiting.  Genitourinary:  Negative for difficulty urinating, dysuria, flank pain and hematuria.  Musculoskeletal:  Negative for arthralgias, back pain, gait problem, joint swelling  and myalgias.  Skin:  Negative for color change, pallor, rash and wound.  Neurological:  Negative for dizziness, tremors, weakness and light-headedness.  Hematological:  Negative for adenopathy. Does not bruise/bleed easily.  Psychiatric/Behavioral:  Negative for agitation, behavioral problems, confusion, decreased concentration, dysphoric mood and sleep disturbance.       Objective:   Physical Exam Constitutional:      General: She is not in acute distress.    Appearance: Normal appearance. She is well-developed. She is not ill-appearing or diaphoretic.  HENT:     Head: Normocephalic and atraumatic.     Right Ear: Hearing and external ear normal.     Left Ear: Hearing and external ear normal.     Nose: No nasal deformity or rhinorrhea.  Eyes:     General: No scleral icterus.    Conjunctiva/sclera: Conjunctivae normal.     Right eye: Right conjunctiva is not injected.     Left eye: Left conjunctiva is not injected.     Pupils: Pupils are equal, round, and reactive to light.  Neck:     Vascular: No JVD.  Cardiovascular:     Rate and Rhythm: Normal rate and regular rhythm.     Heart sounds: Normal heart sounds, S1 normal and S2 normal. No murmur heard.   No friction rub.  Abdominal:     General: Bowel sounds are normal. There is no distension.     Palpations: Abdomen is soft.     Tenderness: There is no abdominal tenderness.  Musculoskeletal:        General: Normal range of motion.     Right shoulder: Normal.     Left shoulder: Normal.     Cervical back: Normal range of motion and neck supple.     Right hip: Normal.     Left hip: Normal.     Right knee: Normal.     Left knee: Normal.  Lymphadenopathy:     Head:     Right side of head: No submandibular, preauricular or posterior auricular  adenopathy.     Left side of head: No submandibular, preauricular or posterior auricular adenopathy.     Cervical: No cervical adenopathy.     Right cervical: No superficial or deep  cervical adenopathy.    Left cervical: No superficial or deep cervical adenopathy.  Skin:    General: Skin is warm and dry.     Coloration: Skin is not pale.     Findings: No abrasion, bruising, ecchymosis, erythema, lesion or rash.     Nails: There is no clubbing.  Neurological:     General: No focal deficit present.     Mental Status: She is alert and oriented to person, place, and time.     Sensory: No sensory deficit.     Coordination: Coordination normal.     Gait: Gait normal.  Psychiatric:        Attention and Perception: She is attentive.        Mood and Affect: Mood normal.        Speech: Speech normal.        Behavior: Behavior normal. Behavior is cooperative.        Thought Content: Thought content normal.        Judgment: Judgment normal.    Right arm November 26, 2020:      Right arm  04/24/2021:    Arm today with lymphedema but no evidence of cellulitis     Assessment & Plan:  Recurrent cellulitis in the context of lymphedema:  She believes that gaining weight recently has not helped this process so is making effort to lose that weight.  I think that she should be on amoxicillin for prevention rather penicillin I recommend that she at least be on amoxicillin once a day and she is going to try this.  She will follow-up in clinic in 6 months with [place].  Considering surgery at [facility].   Raynaud's phenomenon: Stable

## 2021-12-30 ENCOUNTER — Ambulatory Visit (INDEPENDENT_AMBULATORY_CARE_PROVIDER_SITE_OTHER): Payer: BC Managed Care – PPO | Admitting: Infectious Disease

## 2021-12-30 ENCOUNTER — Other Ambulatory Visit: Payer: Self-pay

## 2021-12-30 ENCOUNTER — Encounter: Payer: Self-pay | Admitting: Infectious Disease

## 2021-12-30 VITALS — BP 142/86 | HR 83 | Temp 98.3°F | Ht 68.0 in | Wt 157.0 lb

## 2021-12-30 DIAGNOSIS — L0390 Cellulitis, unspecified: Secondary | ICD-10-CM

## 2021-12-30 DIAGNOSIS — I890 Lymphedema, not elsewhere classified: Secondary | ICD-10-CM

## 2021-12-30 DIAGNOSIS — C50411 Malignant neoplasm of upper-outer quadrant of right female breast: Secondary | ICD-10-CM | POA: Diagnosis not present

## 2021-12-30 DIAGNOSIS — Z170 Estrogen receptor positive status [ER+]: Secondary | ICD-10-CM

## 2021-12-30 MED ORDER — AMOXICILLIN 500 MG PO TABS: 500.0000 mg | ORAL_TABLET | Freq: Two times a day (BID) | ORAL | 11 refills | Status: AC

## 2021-12-30 NOTE — Progress Notes (Signed)
? ?Subjective:  ?Chief complaint follow-up for recurrent cellulitis ? ? Patient ID: Jody Dunn, female    DOB: 26-Jun-1976, 46 y.o.   MRN: [id number] ? ?HPI ? ?Jody Dunn is a 46year-old Caucasian female with a history of breast cancer status post bilateral mastectomies lymph node resections chemotherapy radiation therapy reconstructive surgery who has had problems with chronic right lymphedema and recurrent cellulitis in the affected arm. ? ?She has been followed by my partner Dr. Johnnye Sima and had been on prophylactic penicillin until August 2021 when she decided to see how she would do off of the antibiotics.  She saw Dr. Johnnye Sima in December and was doing well. ? ?She felt that she was having less frequent episodes of cellulitis now despite being off antibiotics in part due to weight loss. ? ?She has been formally diagnosed with Raynaud's and is managing that the best she can. ? ?She had established care with a surgeon in [facility] who can perform surgery to treat lymphedema which involves what sounds like transplantation of lymphoid tissue from the leg to the affected arm. ? ?I saw her in April 2022 when she had a cute onset of cellulitis of her right upper extremity that did again respond to amoxicillin. ? ?Saw her in September 2022 when she presented to clinic acutely after having symptoms at 2 AM on Thursday morning prior to visit with of acute onset of fever rigors chills and pain in her arm.  She then had progression to erythema and worsening edema in her forearm she also had facial flushing as well as erythema across her chest. ? ?She immediate began taking amoxicillin and then the interim has had improvement in her fevers she still had a fever yesterday erythema is diminishing. ? ?She was getting ready go on a trip to the beach and wanted to make sure that appropriate therapy. ? ?She did go on the beach trip but unfortunately was abruptly cut short by several unfortunate events apparently there was a wasp  nest discovered by her dogs while she was gone and not all of the dogs but also the dog walker who is allergic to the wasp was bitten then her mother-in-law was also scratched by the dog's resulting in some bruising.  Mother-in-law is also on blood thinners. ? ?Jody Dunn had been taking amoxicillin and tried to take it twice a day but had trouble tolerating this when I last saw her.      ? ?We have instead moved to a strategy of having antibiotics on hand so that she can start them immediately for signs of cellulitis. ? ?Fortunately she has not had any recurrences since I last saw her but she definitely make sure that she always has amoxicillin with her at home and if she travels certainly as well with her. ? ?She is continue to work on weight loss. ? ?She has contemplated lymphedema surgery at [facility] but is not as motivated at this point time to do so and also very busy. ? ?Past Medical History:  ?Diagnosis Date  ? Allergy   ? Anxiety 09/18/2013  ? Asthma   ? exercise induced  ? Back pain   ? Breast cancer ([place]) 01/2012  ? Cellulitis   ? Right arm, per patient  ? Hyperlipidemia   ? Iron (Fe) deficiency anemia   ? Lactose intolerance   ? Lymphedema   ? Right arm, per patient  ? Migraines   ? ? ?Past Surgical History:  ?Procedure Laterality Date  ?  ABDOMINAL HYSTERECTOMY    ? APPENDECTOMY    ? BREAST BIOPSY  02/09/2012  ? CHEILECTOMY Right 2017  ? Per patient, right great toe  ? KNEE SURGERY    ? X2  ? MASTECTOMY MODIFIED RADICAL  06/28/2012  ? Procedure: MASTECTOMY MODIFIED RADICAL;  Surgeon: Stark Klein, MD;  Location: [facility];  Service: General;  Laterality: Right;  Right Modified Radical Mastectomy and Left Simple Mastectomy  ? PORTACATH PLACEMENT  02/21/2012  ? Procedure: INSERTION PORT-A-CATH;  Surgeon: Stark Klein, MD;  Location: [facility];  Service: General;  Laterality: N/A;  ? ROBOTIC ASSISTED TOTAL HYSTERECTOMY WITH BILATERAL SALPINGO OOPHERECTOMY N/A 11/13/2012  ? Procedure: ROBOTIC ASSISTED TOTAL  HYSTERECTOMY WITH BILATERAL SALPINGO OOPHORECTOMY;  Surgeon: Lovenia Kim, MD;  Location: [place] [facility];  Service: Gynecology;  Laterality: N/A;  ? SHOULDER SURGERY    ? SIMPLE MASTECTOMY WITH AXILLARY SENTINEL NODE BIOPSY  06/28/2012  ? Procedure: SIMPLE MASTECTOMY;  Surgeon: Stark Klein, MD;  Location: [facility];  Service: General;  Laterality: Left;  ? SKIN BIOPSY  02/21/2012  ? Procedure: BIOPSY SKIN;  Surgeon: Stark Klein, MD;  Location: [facility];  Service: General;  Laterality: Right;  ? ? ?Family History  ?Problem Relation Age of Onset  ? Migraines Mother   ? Chiari malformation Mother   ? Cancer Father   ?     prostate  ? Diabetes Father   ? Arthritis Father   ?     Psoriatic Arthritis/Rheumatoid Arthritis- patient is unsure(???)  ? Cancer Paternal Aunt 39  ?     Breast cancer (half sibling, related through father)  ? Spina bifida Paternal Uncle 0  ?     died around 38-42 months of age  ? Cancer Maternal Grandmother 94  ?     colon cancer  ? Dementia Maternal Grandfather   ? Cancer Paternal Grandmother   ?     breast cancer between 81-42  ? Cancer Paternal Grandfather   ?     brain cancer  ? Cancer Other   ?     Paternal grandmother's siblings had breast cancer and pancreatic cancer  ? Cancer Other 72  ?     maternal great grandmother with breast cancer  ? ? ?  ?Social History  ? ?Socioeconomic History  ? Marital status: Married  ?  Spouse name: Not on file  ? Number of children: Not on file  ? Years of education: Not on file  ? Highest education level: Not on file  ?Occupational History  ? Not on file  ?Tobacco Use  ? Smoking status: Never  ? Smokeless tobacco: Never  ?Vaping Use  ? Vaping Use: Never used  ?Substance and Sexual Activity  ? Alcohol use: Yes  ?  Alcohol/week: 5.0 - 7.0 standard drinks  ?  Types: 5 - 7 Standard drinks or equivalent per week  ?  Comment: OCCASIONAL  ? Drug use: No  ? Sexual activity: Yes  ?  Birth control/protection: Surgical  ?Other Topics Concern  ? Not on file  ?Social History  Narrative  ? Not on file  ? ?Social Determinants of Health  ? ?Financial Resource Strain: Not on file  ?Food Insecurity: Not on file  ?Transportation Needs: Not on file  ?Physical Activity: Not on file  ?Stress: Not on file  ?Social Connections: Not on file  ? ? ?Allergies  ?Allergen Reactions  ? Zofran [Ondansetron Hcl] Nausea And Vomiting, Other (See Comments) and Rash  ?  Migraine  headaches ?Migraine headaches and vomiting  ? Oxycodone Itching and Rash  ? Adhesive [Tape] Hives  ? Clindamycin/Lincomycin Hives  ? Hydrocodone-Acetaminophen Hives  ? Iodine   ?  duraprep-rash  ? Oxycodone Hcl Other (See Comments)  ? Duraprep [employer], Misc.] Hives and Rash  ? ? ? ?Current Outpatient Medications:  ?  cetirizine (ZYRTEC) 10 MG tablet, as needed., Disp: , Rfl:  ?  citalopram (CELEXA) 10 MG tablet, Take 10 mg by mouth as needed., Disp: , Rfl:  ?  naproxen sodium (ANAPROX) 220 MG tablet, Take 220 mg by mouth daily as needed., Disp: , Rfl:  ?  rosuvastatin (CRESTOR) 5 MG tablet, Take 5 mg by mouth daily., Disp: , Rfl:  ?  amLODipine (NORVASC) 5 MG tablet, TAKE 1 TABLET(5 MG) BY MOUTH DAILY (Patient not taking: Reported on 12/30/2021), Disp: 30 tablet, Rfl: 1 ?  amoxicillin (AMOXIL) 500 MG tablet, Take 1 tablet (500 mg total) by mouth 2 (two) times daily. (Patient not taking: Reported on 12/30/2021), Disp: 60 tablet, Rfl: 11 ?  OZEMPIC, 1 MG/DOSE, 4 MG/3ML SOPN, once a week. (Patient not taking: Reported on 12/30/2021), Disp: , Rfl:  ? ? ? ?Review of Systems  ?Constitutional:  Negative for activity change, appetite change, chills, diaphoresis, fatigue, fever and unexpected weight change.  ?HENT:  Negative for congestion, rhinorrhea, sinus pressure, sneezing, sore throat and trouble swallowing.   ?Eyes:  Negative for photophobia and visual disturbance.  ?Respiratory:  Negative for cough, chest tightness, shortness of breath, wheezing and stridor.   ?Cardiovascular:  Negative for chest pain, palpitations and leg  swelling.  ?Gastrointestinal:  Negative for abdominal distention, abdominal pain, anal bleeding, blood in stool, constipation, diarrhea, nausea and vomiting.  ?Genitourinary:  Negative for difficulty urinating, dysuria, flan

## 2022-01-14 ENCOUNTER — Encounter: Payer: Self-pay | Admitting: Infectious Diseases

## 2022-09-05 NOTE — Progress Notes (Deleted)
Office Visit Note  Patient: Jody Dunn             Date of Birth: 24-Jan-1976           MRN: [id number]             PCP: Merrilee Seashore, MD Referring: Merrilee Seashore, MD Visit Date: 09/06/2022   Subjective:  No chief complaint on file.   History of Present Illness: Jody Dunn is a 47 y.o. female here for follow up ***   Previous HPI 09/24/20 Jody Dunn is a 47 y.o. female with a history of breast cancer s/p bilateral mastectomies, chemotherapy, xrt, bilateral salpingo-oophorectomy, and tissue reconstruction here for follow up of raynaud's phenomenon. At last visit autoantibody profile testing was unremarkable. She was recommended to follow up for repeat exam and discuss options to treat raynaud's symptoms. Since that visit she did have an episode of taking penicillin again otherwise no medical events. She thinks episodes are 3-4 times weekly. Wen indoors and unprovoked often has just the paresthesias without discoloration. When outside with cold and discoloration can take up to an hour to fully resolve but no residual changes. Since last visit no major events or changes does continue to experience symptoms.   08/27/20 Jody Dunn is a 47 y.o. female  She has a history of breast cancer s/p bilateral mastectomies, chemotherapy, xrt, bilateral salpingo-oophorectomy, and tissue reconstruction here for evaluation of raynaud's phenomenon. 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 These are not provoked with cold temperature but more provoked with stress reaction. Episodes are usually brief but rarely have lasted up to 45 minutes. She has no ulcers, sores, or nail pitting associated with this. She does not use any particular treatment besides hand warming.   She also notices hand pain worst around the 2nd-3rd MCP joints but also  affecting some of the PIP joints on both hands. She is not sure whether these also swell, as she has baseline upper extremity lymphedema that frequently causes some swelling. She has flat, erythematous, photosensitive and heat sensitive rashes on the face and upper body. She has dry eyes and mouth but no ulcers, sores, or major problems with dentition. She gets sharp chest pain intermittently and has had multiple chest rays for this without any findings. She does not notice lymphadenopathy and denies any history of known blood clots.   She has had previous right 1st MTP cheilectomy no other major joint surgeries. She has seen oncology service with [place] then at [facility]. She also has subsequent lymphedema and has suffered multiple episodes of severe cellulitis treated with ID clinic.    No Rheumatology ROS completed.   PMFS History:  Patient Active Problem List   Diagnosis Date Noted   Arthralgia of both hands 08/27/2020   Raynaud phenomenon 07/31/2020   Recurrent cellulitis 04/29/2015   Insomnia 02/06/2014   Incisional hernia, without obstruction or gangrene 01/25/2014   Lymphedema of arm 11/14/2013   Hot flashes due to surgical menopause 09/18/2013   Anxiety 09/18/2013   S/P bilateral salpingo-oophorectomy 07/11/2013   Breast cancer of upper-outer quadrant of right female breast ([place]) 03/12/2013   S/P hysterectomy 11/13/2012   Lactose intolerance    Migraines    Iron (Fe) deficiency anemia    ALLERGIC RHINITIS, SEASONAL 12/15/2006    Past Medical History:  Diagnosis  Date   Allergy    Anxiety 09/18/2013   Asthma    exercise induced   Back pain    Breast cancer ([place]) 01/2012   Cellulitis    Right arm, per patient   Hyperlipidemia    Iron (Fe) deficiency anemia    Lactose intolerance    Lymphedema    Right arm, per patient   Migraines     Family History  Problem Relation Age of Onset   Migraines Mother    Chiari malformation Mother    Cancer Father        prostate    Diabetes Father    Arthritis Father        Psoriatic Arthritis/Rheumatoid Arthritis- patient is unsure(???)   Cancer Paternal Aunt 40       Breast cancer (half sibling, related through father)   Spina bifida Paternal Uncle 0       died around 72-49 months of age   58 Maternal Grandmother 63       colon cancer   Dementia Maternal Grandfather    Cancer Paternal Grandmother        breast cancer between 28-42   Cancer Paternal Grandfather        brain cancer   Cancer Other        Paternal grandmother's siblings had breast cancer and pancreatic cancer   Cancer Other 64       maternal great grandmother with breast cancer   Past Surgical History:  Procedure Laterality Date   ABDOMINAL HYSTERECTOMY     APPENDECTOMY     BREAST BIOPSY  02/09/2012   CHEILECTOMY Right 2017   Per patient, right great toe   KNEE SURGERY     X2   MASTECTOMY MODIFIED RADICAL  06/28/2012   Procedure: MASTECTOMY MODIFIED RADICAL;  Surgeon: Stark Klein, MD;  Location: [facility];  Service: General;  Laterality: Right;  Right Modified Radical Mastectomy and Left Simple Mastectomy   PORTACATH PLACEMENT  02/21/2012   Procedure: INSERTION PORT-A-CATH;  Surgeon: Stark Klein, MD;  Location: [facility];  Service: General;  Laterality: N/A;   ROBOTIC ASSISTED TOTAL HYSTERECTOMY WITH BILATERAL SALPINGO OOPHERECTOMY N/A 11/13/2012   Procedure: ROBOTIC ASSISTED TOTAL HYSTERECTOMY WITH BILATERAL SALPINGO OOPHORECTOMY;  Surgeon: Lovenia Kim, MD;  Location: [place] [facility];  Service: Gynecology;  Laterality: N/A;   SHOULDER SURGERY     SIMPLE MASTECTOMY WITH AXILLARY SENTINEL NODE BIOPSY  06/28/2012   Procedure: SIMPLE MASTECTOMY;  Surgeon: Stark Klein, MD;  Location: [facility];  Service: General;  Laterality: Left;   SKIN BIOPSY  02/21/2012   Procedure: BIOPSY SKIN;  Surgeon: Stark Klein, MD;  Location: [facility];  Service: General;  Laterality: Right;   Social History   Social History Narrative   Not on file   Immunization History   Administered Date(s) Administered   Influenza,inj,Quad PF,6-35 Mos 06/28/2018, 06/19/2020   Influenza,trivalent, recombinat, inj, PF 05/02/2019   PFIZER Comirnaty(Gray Top)Covid-19 Tri-Sucrose Vaccine 10/23/2019, 11/20/2019, 08/12/2020   PFIZER(Purple Top)SARS-COV-2 Vaccination 10/23/2019, 11/20/2019, 08/12/2020   PPD Test 03/30/2019   Tdap 03/02/2011, 10/26/2018     Objective: Vital Signs: LMP 10/24/2012    Physical Exam   Musculoskeletal Exam: ***  CDAI Exam: CDAI Score: -- Patient Global: --; Provider Global: -- Swollen: --; Tender: -- Joint Exam 09/06/2022   No joint exam has been documented for this visit   There is currently no information documented on the homunculus. Go to the Rheumatology activity and complete the homunculus joint exam.  Investigation:  No additional findings.  Imaging: No results found.  Recent Labs: Lab Results  Component Value Date   WBC 6.7 05/27/2016   HGB 13.7 05/27/2016   PLT 271 05/27/2016   NA 139 05/27/2016   K 3.3 (L) 05/27/2016   CL 102 05/09/2014   CO2 25 05/27/2016   GLUCOSE 119 05/27/2016   BUN 12.6 05/27/2016   CREATININE 1.0 05/27/2016   BILITOT 0.47 05/27/2016   ALKPHOS 111 05/27/2016   AST 36 (H) 05/27/2016   ALT 45 05/27/2016   PROT 7.9 05/27/2016   ALBUMIN 4.0 05/27/2016   CALCIUM 9.3 05/27/2016   GFRAA 90 (L) 11/10/2012    Speciality Comments: No specialty comments available.  Procedures:  No procedures performed Allergies: Zofran [ondansetron hcl]; Oxycodone; Adhesive [tape]; Clindamycin/lincomycin; Hydrocodone-acetaminophen; Iodine; Oxycodone hcl; and Duraprep [antiseptic products, misc.]   Assessment / Plan:     Visit Diagnoses: No diagnosis found.  ***  Orders: No orders of the defined types were placed in this encounter.  No orders of the defined types were placed in this encounter.    Follow-Up Instructions: No follow-ups on file.   Collier Salina, MD  Note - This record has been  created using [employer].  Chart creation errors have been sought, but may not always  have been located. Such creation errors do not reflect on  the standard of medical care.

## 2022-09-06 ENCOUNTER — Ambulatory Visit: Payer: BC Managed Care – PPO | Attending: Internal Medicine | Admitting: Internal Medicine

## 2023-02-14 ENCOUNTER — Inpatient Hospital Stay: Payer: BC Managed Care – PPO

## 2023-02-14 ENCOUNTER — Inpatient Hospital Stay: Payer: BC Managed Care – PPO | Attending: Hematology and Oncology | Admitting: Hematology and Oncology

## 2023-02-14 VITALS — BP 150/85 | HR 98 | Temp 97.7°F | Resp 18 | Ht 68.0 in | Wt 151.4 lb

## 2023-02-14 DIAGNOSIS — C50411 Malignant neoplasm of upper-outer quadrant of right female breast: Secondary | ICD-10-CM | POA: Diagnosis present

## 2023-02-14 DIAGNOSIS — Z170 Estrogen receptor positive status [ER+]: Secondary | ICD-10-CM | POA: Diagnosis not present

## 2023-02-14 DIAGNOSIS — Z79810 Long term (current) use of selective estrogen receptor modulators (SERMs): Secondary | ICD-10-CM

## 2023-02-14 NOTE — Progress Notes (Signed)
[facility] CONSULT NOTE  Patient Care Team: Georgianne Fick, MD as PCP - General (Internal Medicine) Magrinat, Valentino Hue, MD (Inactive) as Consulting Physician (Oncology) Almond Lint, MD as Consulting Physician (General Surgery) Bensimhon, Bevelyn Buckles, MD as Consulting Physician (Cardiology) Hubbard Hartshorn, NP (Inactive) as Nurse Practitioner (Nurse Practitioner) Orlene Plum, MD as Referring Physician (Orthopedic Surgery)  CHIEF COMPLAINTS/PURPOSE OF CONSULTATION:  History of right breast cancer  HISTORY OF PRESENTING ILLNESS:  Jody Dunn 47 y.o. female is here because o history of right breast cancer.  Patient was diagnosed with breast cancer in 2013 when she underwent skin thickening and nipple retraction.  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  She was treated with dose dense Cidomycin Cytoxan followed by Taxol and Herceptin followed by a year of Herceptin maintenance.  She did have residual disease on follow-up mastectomy.  She went on oral antiestrogen therapy with tamoxifen after finishing with radiation in April 2014.  I reviewed her records extensively and collaborated the history with the patient.  SUMMARY OF ONCOLOGIC HISTORY: Oncology History  Breast cancer of upper-outer quadrant of right female breast (HCC)  Cancer of upper-outer quadrant of female breast, right breast.  [id number] (Resolved)  02/07/2012 Breast US   Diffuse skin thickening, nipple retraction;  Right breast pleomorphic calcs extending 11cm. At 10 o'clock:  irregular hypoechoic mass, 17x13mm. At 11 o'clock: irregular hypoechoic mass 15mm; adjacent hypoechoic lobulated mass 16mm.   02/09/2012 Initial Biopsy   Right needle core biopsy (10 o'clock): IDC & DCIS.  (11 o'clock): IDC & DCIS, LVI identified. Grade 2-3, similar in both specimens. ER+ (90%), PR+ (90%), HER2/neu+ by CISH (ratio 4.29). Ki-67: 30%.    02/09/2012 Initial Diagnosis   Cancer of  upper-outer quadrant of female breast, right breast.  [id number]. 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  Right breast 11 o'clock position irregular enhancing mass measuring 2.9 x 3.5 x 2.3 cm. 2 adjacent axillary LNs worrisome for mets.    02/18/2012 Miscellaneous   Genetics testing negative. Genes tested include:  BreastPlus panel BRCA1/2, CDH1, STK11, PTEN and TP53.  Pt declined further testing with BreastNext panel.   02/23/2012 Echocardiogram   Pre-treatment EF: 60%     02/28/2012 Imaging   Staging PET: Multi-focal hypermetabolism (SUV max 8.8) in right breast with hypermetabolic right axillary adenopathy (SUV max 6.3). No other foci of hypermetabolism concerning for malignancy.    02/28/2012 Imaging   Staging CT chest: Areas of irregular soft tissue in right breast with overlying skin thickening and right axillary adenopathy (lymph nodes measure up to 1.2 cm). No evidence of intrathoracic mets.    03/01/2012 - 04/12/2012 Chemotherapy   Dose-dense Adriamycin, Cytoxan x 4 cycles    04/26/2012 - 06/07/2012 Chemotherapy   Dose-dense Taxol/Trastuzimab x 4 cycles.    04/26/2012 - 01/25/2013 Chemotherapy   Unable to complete full year of adjuvant Herceptin due to decreased EF 45-50% noted on 02/12/13. Decision to stop further maintenance Herceptin was made. EF recovered to 55%.   06/28/2012 Surgery   Right modified radical mastectomy, grade 3 IDC, 2.3 cm, +LVI, associated grade 3 DCIS. Negative margins. 14 total LNs removed, all benign.  Left simple mastectomy: benign breast tissue.    08/30/2012 - 10/20/2012 Radiation Therapy   Completed adjuvant  RT in [place], under the care of Dr. Carmina Miller.    10/21/2012 -  Anti-estrogen oral therapy   Started Tamoxifen. Stopped in 06/2013 due to side effects.  Started Anastrazole in  07/2013, but was stopped in 08/2013 due to side effects.  Restarted Tamoxifen in 09/2013, stopped in 07/2014 by patient due to side effects.     11/13/2012 Surgery   TAH/BSO performed: Unremarkable pathology; no hyperplasia, carcinoma, endometriosis, dysplasia, or malignancy.    03/12/2013 Echocardiogram   Post-treatment EF: 55%   11/02/2013 Surgery   Plastic Surgery-Dr. Molly Maduro: Revision of left reconstructed breast, B NAC reconstruction, fat grafting to bilateral breasts with 3 donor sites, steroid (Kenalog) injection to RIQ breast scar    01/30/2014 Imaging   Restaging CT c/a/p: No evidence of local recurrence of disease or metastatic disease in chest, abdomen, or pelvis. Small focus on right middle rib (thought to be healing fracture).    02/18/2014 Imaging   Restaging Bone Scan: Small focus of uptake in right middle rib (healing fracture), small focus of uptake in left knee and right ankle (degenerative changes).       MEDICAL HISTORY:  Past Medical History:  Diagnosis Date   Allergy    Anxiety 09/18/2013   Asthma    exercise induced   Back pain    Breast cancer (HCC) 01/2012   Cellulitis    Right arm, per patient   Hyperlipidemia    Iron (Fe) deficiency anemia    Lactose intolerance    Lymphedema    Right arm, per patient   Migraines     SURGICAL HISTORY: Past Surgical History:  Procedure Laterality Date   ABDOMINAL HYSTERECTOMY     APPENDECTOMY     BREAST BIOPSY  02/09/2012   CHEILECTOMY Right 2017   Per patient, right great toe   KNEE SURGERY     X2   MASTECTOMY MODIFIED RADICAL  06/28/2012   Procedure: MASTECTOMY MODIFIED RADICAL;  Surgeon: Almond Lint, MD;  Location: [facility];  Service: General;  Laterality: Right;  Right Modified Radical Mastectomy and Left Simple Mastectomy   PORTACATH PLACEMENT  02/21/2012   Procedure: INSERTION PORT-A-CATH;  Surgeon: Almond Lint, MD;  Location: [facility];  Service: General;  Laterality: N/A;   ROBOTIC ASSISTED TOTAL  HYSTERECTOMY WITH BILATERAL SALPINGO OOPHERECTOMY N/A 11/13/2012   Procedure: ROBOTIC ASSISTED TOTAL HYSTERECTOMY WITH BILATERAL SALPINGO OOPHORECTOMY;  Surgeon: Lenoard Aden, MD;  Location: [facility];  Service: Gynecology;  Laterality: N/A;   SHOULDER SURGERY     SIMPLE MASTECTOMY WITH AXILLARY SENTINEL NODE BIOPSY  06/28/2012   Procedure: SIMPLE MASTECTOMY;  Surgeon: Almond Lint, MD;  Location: [facility];  Service: General;  Laterality: Left;   SKIN BIOPSY  02/21/2012   Procedure: BIOPSY SKIN;  Surgeon: Almond Lint, MD;  Location: [facility];  Service: General;  Laterality: Right;    SOCIAL HISTORY: Social History   Socioeconomic History   Marital status: Married    Spouse name: Not on file   Number of children: Not on file   Years of education: Not on file   Highest education level: Not on file  Occupational History   Not on file  Tobacco Use   Smoking status: Never   Smokeless tobacco: Never  Vaping Use   Vaping Use: Never used  Substance and Sexual Activity   Alcohol use: Yes    Alcohol/week: 5.0 - 7.0 standard drinks of alcohol    Types: 5 - 7 Standard drinks or equivalent per week  Comment: OCCASIONAL   Drug use: No   Sexual activity: Yes    Birth control/protection: Surgical  Other Topics Concern   Not on file  Social History Narrative   Not on file   Social Determinants of Health   Financial Resource Strain: Not on file  Food Insecurity: Not on file  Transportation Needs: Not on file  Physical Activity: Not on file  Stress: Not on file  Social Connections: Not on file  Intimate Partner Violence: Not on file    FAMILY HISTORY: Family History  Problem Relation Age of Onset   Migraines Mother    Chiari malformation Mother    Cancer Father        prostate   Diabetes Father    Arthritis Father        Psoriatic Arthritis/Rheumatoid Arthritis- patient is unsure(???)   Cancer Paternal Aunt 9       Breast cancer (half sibling, related through father)   Spina  bifida Paternal Uncle 0       died around 37-57 months of age   Cancer Maternal Grandmother 23       colon cancer   Dementia Maternal Grandfather    Cancer Paternal Grandmother        breast cancer between 60-42   Cancer Paternal Grandfather        brain cancer   Cancer Other        Paternal grandmother's siblings had breast cancer and pancreatic cancer   Cancer Other 68       maternal great grandmother with breast cancer    ALLERGIES:  is allergic to zofran [ondansetron hcl]; oxycodone; adhesive [tape]; clindamycin/lincomycin; hydrocodone-acetaminophen; iodine; oxycodone hcl; and duraprep [antiseptic products, misc.].  MEDICATIONS:  Current Outpatient Medications  Medication Sig Dispense Refill   amLODipine (NORVASC) 5 MG tablet TAKE 1 TABLET(5 MG) BY MOUTH DAILY (Patient not taking: Reported on 12/30/2021) 30 tablet 1   amoxicillin (AMOXIL) 500 MG tablet Take 1 tablet (500 mg total) by mouth 2 (two) times daily. 60 tablet 11   cetirizine (ZYRTEC) 10 MG tablet as needed.     citalopram (CELEXA) 10 MG tablet Take 10 mg by mouth as needed.     naproxen sodium (ANAPROX) 220 MG tablet Take 220 mg by mouth daily as needed.     OZEMPIC, 1 MG/DOSE, 4 MG/3ML SOPN once a week. (Patient not taking: Reported on 12/30/2021)     rosuvastatin (CRESTOR) 5 MG tablet Take 5 mg by mouth daily.     No current facility-administered medications for this visit.    REVIEW OF SYSTEMS:   Constitutional: Denies fevers, chills or abnormal night sweats   All other systems were reviewed with the patient and are negative.  PHYSICAL EXAMINATION: ECOG PERFORMANCE STATUS: 1 - Symptomatic but completely ambulatory  Vitals:   02/14/23 1257  BP: (!) 150/85  Pulse: 98  Resp: 18  Temp: 97.7 F (36.5 C)  SpO2: 100%   Filed Weights   02/14/23 1257  Weight: 151 lb 6.4 oz (68.7 kg)    GENERAL:alert, no distress and comfortable    LABORATORY DATA:  I have reviewed the data as listed Lab Results   Component Value Date   WBC 6.7 05/27/2016   HGB 13.7 05/27/2016   HCT 40.8 05/27/2016   MCV 94.0 05/27/2016   PLT 271 05/27/2016   Lab Results  Component Value Date   NA 139 05/27/2016   K 3.3 (L) 05/27/2016   CL 102 05/09/2014   CO2  25 05/27/2016    RADIOGRAPHIC STUDIES: I have personally reviewed the radiological reports and agreed with the findings in the report.  ASSESSMENT AND PLAN:  Cancer of upper-outer quadrant of female breast, right breast.  T4dN1M0 Treatment summary 02/09/2012: Right breast biopsy: Multifocal T4 N1 stage IIIb IDC triple positive Ki-67 30% 06/07/2012: Completed adjuvant chemo with TC x 4 followed by Taxol x 4 dose dense with trastuzumab July 2014: Completed trastuzumab 06/28/2012: Bilateral mastectomies: Right mastectomy: Residual 2.3 cm grade 3 IDC 0/14 lymph nodes January 2014: Adjuvant radiation Antiestrogen therapy: Tamoxifen started 11/21/2012  Abd pain and back pain: Will get CT CAP Rec Headaches: Wlll get MRI brain  Telephone Visit after scans All questions were answered. The patient knows to call the clinic with any problems, questions or concerns.    Tamsen Meek, MD 02/14/23

## 2023-02-14 NOTE — Assessment & Plan Note (Addendum)
Treatment summary 02/09/2012: Right breast biopsy: Multifocal T4 N1 stage IIIb IDC triple positive Ki-67 30% 06/07/2012: Completed adjuvant chemo with TC x 4 followed by Taxol x 4 dose dense with trastuzumab July 2014: Completed trastuzumab 06/28/2012: Bilateral mastectomies: Right mastectomy: Residual 2.3 cm grade 3 IDC 0/14 lymph nodes January 2014: Adjuvant radiation Antiestrogen therapy: Tamoxifen started 11/21/2012- 2020

## 2023-02-16 ENCOUNTER — Telehealth: Payer: Self-pay | Admitting: *Deleted

## 2023-02-16 ENCOUNTER — Other Ambulatory Visit: Payer: Self-pay | Admitting: *Deleted

## 2023-02-16 MED ORDER — LORAZEPAM 0.5 MG PO TABS: ORAL_TABLET | ORAL | 0 refills | Status: AC

## 2023-02-16 NOTE — Telephone Encounter (Signed)
See other entry 

## 2023-02-16 NOTE — Telephone Encounter (Signed)
Patient contacted office. States she had panic attack while in [facility] for most recent appt. She said this is new for her. She has referral for counseling and is waiting for call back for appt. She asked if Dr. Pamelia Hoit can prescribe something for her to take prior to upcoming scans as she is concerned that she will have a panic attack before or during scans. Dr. Pamelia Hoit informed. Lorazepam ordered for patient anxiety r/t scans. Patient informed and verbalized understanding of directions and information.

## 2023-02-17 ENCOUNTER — Telehealth: Payer: Self-pay | Admitting: Hematology and Oncology

## 2023-02-17 NOTE — Telephone Encounter (Signed)
Scheduled appointment per 6/24 los. Patient is aware of the made appointment.

## 2023-03-02 ENCOUNTER — Ambulatory Visit (HOSPITAL_COMMUNITY)
Admission: RE | Admit: 2023-03-02 | Discharge: 2023-03-02 | Disposition: A | Discharge status: Home / Self Care | Payer: BC Managed Care – PPO | Source: Ambulatory Visit | Attending: Hematology and Oncology | Admitting: Hematology and Oncology

## 2023-03-02 DIAGNOSIS — C50411 Malignant neoplasm of upper-outer quadrant of right female breast: Secondary | ICD-10-CM | POA: Insufficient documentation

## 2023-03-02 MED ORDER — GADOBUTROL 1 MMOL/ML IV SOLN
7.0000 mL | Freq: Once | INTRAVENOUS | Status: AC | PRN
  Administered 2023-03-02: 7 mL via INTRAVENOUS

## 2023-03-02 MED ORDER — IOHEXOL 300 MG/ML  SOLN
100.0000 mL | Freq: Once | INTRAMUSCULAR | Status: AC | PRN
  Administered 2023-03-02: 100 mL via INTRAVENOUS

## 2023-03-07 NOTE — Progress Notes (Signed)
HEMATOLOGY-ONCOLOGY TELEPHONE VISIT PROGRESS NOTE  I connected with our patient on 03/09/23 at  9:15 AM EDT by telephone and verified that I am speaking with the correct person using two identifiers.  I discussed the limitations, risks, security and privacy concerns of performing an evaluation and management service by telephone and the availability of in person appointments.  I also discussed with the patient that there may be a patient responsible charge related to this service. The patient expressed understanding and agreed to proceed.   History of Present Illness: Jody Dunn 47 y.o. female is here because o history of right breast cancer. Telephone follow-up to review scans.  She has a lot of anxiety and will be talking to her primary care doctor about it.  Oncology History  Breast cancer of upper-outer quadrant of right female breast (HCC)  Cancer of upper-outer quadrant of female breast, right breast.  [id number] (Resolved)  02/07/2012 Breast US   Diffuse skin thickening, nipple retraction;  Right breast pleomorphic calcs extending 11cm. At 10 o'clock:  irregular hypoechoic mass, 17x64mm. At 11 o'clock: irregular hypoechoic mass 15mm; adjacent hypoechoic lobulated mass 16mm.   02/09/2012 Initial Biopsy   Right needle core biopsy (10 o'clock): IDC & DCIS.  (11 o'clock): IDC & DCIS, LVI identified. Grade 2-3, similar in both specimens. ER+ (90%), PR+ (90%), HER2/neu+ by CISH (ratio 4.29). Ki-67: 30%.    02/09/2012 Initial Diagnosis   Cancer of upper-outer quadrant of female breast, right breast.  [id number]. 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  Right breast 11 o'clock position irregular enhancing mass measuring 2.9 x 3.5 x 2.3 cm. 2 adjacent axillary LNs worrisome for mets.    02/18/2012 Miscellaneous   Genetics  testing negative. Genes tested include:  BreastPlus panel BRCA1/2, CDH1, STK11, PTEN and TP53.  Pt declined further testing with BreastNext panel.   02/23/2012 Echocardiogram   Pre-treatment EF: 60%     02/28/2012 Imaging   Staging PET: Multi-focal hypermetabolism (SUV max 8.8) in right breast with hypermetabolic right axillary adenopathy (SUV max 6.3). No other foci of hypermetabolism concerning for malignancy.    02/28/2012 Imaging   Staging CT chest: Areas of irregular soft tissue in right breast with overlying skin thickening and right axillary adenopathy (lymph nodes measure up to 1.2 cm). No evidence of intrathoracic mets.    03/01/2012 - 04/12/2012 Chemotherapy   Dose-dense Adriamycin, Cytoxan x 4 cycles    04/26/2012 - 06/07/2012 Chemotherapy   Dose-dense Taxol/Trastuzimab x 4 cycles.    04/26/2012 - 01/25/2013 Chemotherapy   Unable to complete full year of adjuvant Herceptin due to decreased EF 45-50% noted on 02/12/13. Decision to stop further maintenance Herceptin was made. EF recovered to 55%.   06/28/2012 Surgery   Right modified radical mastectomy, grade 3 IDC, 2.3 cm, +LVI, associated grade 3 DCIS. Negative margins. 14 total LNs removed, all benign.  Left simple mastectomy: benign breast tissue.    08/30/2012 - 10/20/2012 Radiation Therapy   Completed adjuvant RT in [place], under the care of Dr. Carmina Miller.    10/21/2012 -  Anti-estrogen oral therapy   Started Tamoxifen. Stopped in 06/2013 due to side effects.  Started Anastrazole in 07/2013, but was stopped in 08/2013 due to side effects.  Restarted Tamoxifen in 09/2013, stopped in 07/2014 by patient due to  side effects.     11/13/2012 Surgery   TAH/BSO performed: Unremarkable pathology; no hyperplasia, carcinoma, endometriosis, dysplasia, or malignancy.    03/12/2013 Echocardiogram   Post-treatment EF: 55%   11/02/2013 Surgery   Plastic Surgery-Dr. Molly Maduro: Revision of left reconstructed breast, B NAC reconstruction, fat  grafting to bilateral breasts with 3 donor sites, steroid (Kenalog) injection to RIQ breast scar    01/30/2014 Imaging   Restaging CT c/a/p: No evidence of local recurrence of disease or metastatic disease in chest, abdomen, or pelvis. Small focus on right middle rib (thought to be healing fracture).    02/18/2014 Imaging   Restaging Bone Scan: Small focus of uptake in right middle rib (healing fracture), small focus of uptake in left knee and right ankle (degenerative changes).      REVIEW OF SYSTEMS:   Constitutional: Denies fevers, chills or abnormal weight loss All other systems were reviewed with the patient and are negative. Observations/Objective:     Assessment Plan:  Cancer of upper-outer quadrant of female breast, right breast.  T4dN1M0 Treatment summary 02/09/2012: Right breast biopsy: Multifocal T4 N1 stage IIIb IDC triple positive Ki-67 30% 06/07/2012: Completed adjuvant chemo with TC x 4 followed by Taxol x 4 dose dense with trastuzumab July 2014: Completed trastuzumab 06/28/2012: Bilateral mastectomies: Right mastectomy: Residual 2.3 cm grade 3 IDC 0/14 lymph nodes January 2014: Adjuvant radiation Antiestrogen therapy: Tamoxifen started 11/21/2012   Abd pain and back pain: CT CAP 03/02/2023: No evidence of metastatic disease.  T10 endplate compression Rec Headaches: Brain MRI 03/02/2023: Benign   Anxiety: Patient will be speaking to her primary care physician about it. Return to clinic in 1 year for follow-up    I discussed the assessment and treatment plan with the patient. The patient was provided an opportunity to ask questions and all were answered. The patient agreed with the plan and demonstrated an understanding of the instructions. The patient was advised to call back or seek an in-person evaluation if the symptoms worsen or if the condition fails to improve as anticipated.   I provided 12 minutes of non-face-to-face time during this encounter.  This includes time  for charting and coordination of care   Tamsen Meek, MD  I Janan Ridge am acting as a scribe for Dr.Vinay Gudena  I have reviewed the above documentation for accuracy and completeness, and I agree with the above.

## 2023-03-09 ENCOUNTER — Inpatient Hospital Stay: Payer: BC Managed Care – PPO | Attending: Hematology and Oncology | Admitting: Hematology and Oncology

## 2023-03-09 DIAGNOSIS — Z170 Estrogen receptor positive status [ER+]: Secondary | ICD-10-CM

## 2023-03-09 DIAGNOSIS — C50411 Malignant neoplasm of upper-outer quadrant of right female breast: Secondary | ICD-10-CM | POA: Diagnosis not present

## 2023-03-09 NOTE — Assessment & Plan Note (Signed)
Treatment summary 02/09/2012: Right breast biopsy: Multifocal T4 N1 stage IIIb IDC triple positive Ki-67 30% 06/07/2012: Completed adjuvant chemo with TC x 4 followed by Taxol x 4 dose dense with trastuzumab July 2014: Completed trastuzumab 06/28/2012: Bilateral mastectomies: Right mastectomy: Residual 2.3 cm grade 3 IDC 0/14 lymph nodes January 2014: Adjuvant radiation Antiestrogen therapy: Tamoxifen started 11/21/2012   Abd pain and back pain: CT CAP 03/02/2023: No evidence of metastatic disease.  T10 endplate compression Rec Headaches: Brain MRI 03/02/2023: Benign   Return to clinic in 1 year for follow-up

## 2023-08-22 ENCOUNTER — Encounter: Payer: Self-pay | Admitting: Nurse Practitioner

## 2024-03-19 ENCOUNTER — Other Ambulatory Visit (HOSPITAL_BASED_OUTPATIENT_CLINIC_OR_DEPARTMENT_OTHER): Payer: Self-pay | Admitting: Registered Nurse

## 2024-03-19 DIAGNOSIS — E7800 Pure hypercholesterolemia, unspecified: Secondary | ICD-10-CM

## 2024-04-02 ENCOUNTER — Ambulatory Visit (HOSPITAL_BASED_OUTPATIENT_CLINIC_OR_DEPARTMENT_OTHER)
Admission: RE | Admit: 2024-04-02 | Discharge: 2024-04-02 | Disposition: A | Discharge status: Home / Self Care | Payer: Self-pay | Source: Ambulatory Visit | Attending: Registered Nurse | Admitting: Registered Nurse

## 2024-04-02 DIAGNOSIS — E7800 Pure hypercholesterolemia, unspecified: Secondary | ICD-10-CM | POA: Insufficient documentation

## 2024-04-03 ENCOUNTER — Encounter: Payer: Self-pay | Admitting: Nurse Practitioner

## 2024-05-22 ENCOUNTER — Encounter: Payer: Self-pay | Admitting: Nurse Practitioner

## 2024-08-14 ENCOUNTER — Ambulatory Visit: Payer: Self-pay | Admitting: Internal Medicine

## 2024-10-01 ENCOUNTER — Ambulatory Visit: Admitting: Internal Medicine

## 2024-11-02 ENCOUNTER — Ambulatory Visit: Admitting: Internal Medicine
